# Patient Record
Sex: Female | Born: 1970 | Race: Asian | Hispanic: No | Marital: Married | Smoking: Never smoker
Health system: Southern US, Community
[De-identification: ages and names within clinical notes are randomized; demographics above are authoritative.]

## PROBLEM LIST (undated history)

## (undated) DIAGNOSIS — K3580 Unspecified acute appendicitis: Secondary | ICD-10-CM

## (undated) DIAGNOSIS — N840 Polyp of corpus uteri: Secondary | ICD-10-CM

## (undated) DIAGNOSIS — R079 Chest pain, unspecified: Secondary | ICD-10-CM

## (undated) DIAGNOSIS — D649 Anemia, unspecified: Secondary | ICD-10-CM

## (undated) DIAGNOSIS — E559 Vitamin D deficiency, unspecified: Secondary | ICD-10-CM

## (undated) DIAGNOSIS — E785 Hyperlipidemia, unspecified: Secondary | ICD-10-CM

## (undated) DIAGNOSIS — N920 Excessive and frequent menstruation with regular cycle: Secondary | ICD-10-CM

## (undated) DIAGNOSIS — E119 Type 2 diabetes mellitus without complications: Secondary | ICD-10-CM

## (undated) HISTORY — DX: Vitamin D deficiency, unspecified: E55.9

## (undated) HISTORY — DX: Type 2 diabetes mellitus without complications: E11.9

## (undated) HISTORY — DX: Polyp of corpus uteri: N84.0

## (undated) HISTORY — DX: Excessive and frequent menstruation with regular cycle: N92.0

## (undated) HISTORY — DX: Hyperlipidemia, unspecified: E78.5

## (undated) HISTORY — PX: TUBAL LIGATION: SHX77

## (undated) HISTORY — DX: Unspecified acute appendicitis: K35.80

---

## 2005-07-19 ENCOUNTER — Encounter: Admission: RE | Admit: 2005-07-19 | Discharge: 2005-07-19 | Payer: Self-pay | Admitting: Internal Medicine

## 2006-03-16 ENCOUNTER — Encounter: Admission: RE | Admit: 2006-03-16 | Discharge: 2006-03-16 | Payer: Self-pay | Admitting: Internal Medicine

## 2008-03-18 ENCOUNTER — Emergency Department (HOSPITAL_COMMUNITY): Admission: EM | Admit: 2008-03-18 | Discharge: 2008-03-18 | Payer: Self-pay | Admitting: Emergency Medicine

## 2010-06-20 LAB — HM PAP SMEAR: HM Pap smear: NORMAL

## 2010-12-21 ENCOUNTER — Other Ambulatory Visit: Payer: Self-pay | Admitting: Family Medicine

## 2010-12-21 DIAGNOSIS — R221 Localized swelling, mass and lump, neck: Secondary | ICD-10-CM

## 2010-12-24 ENCOUNTER — Ambulatory Visit
Admission: RE | Admit: 2010-12-24 | Discharge: 2010-12-24 | Disposition: A | Discharge status: Home / Self Care | Payer: BC Managed Care – PPO | Source: Ambulatory Visit | Attending: Family Medicine | Admitting: Family Medicine

## 2010-12-24 DIAGNOSIS — R221 Localized swelling, mass and lump, neck: Secondary | ICD-10-CM

## 2011-03-21 LAB — URINALYSIS, ROUTINE W REFLEX MICROSCOPIC
Bilirubin Urine: NEGATIVE
Nitrite: NEGATIVE
Protein, ur: 100 — AB
pH: 6.5

## 2011-03-21 LAB — URINE MICROSCOPIC-ADD ON

## 2011-03-21 LAB — URINE CULTURE: Colony Count: >=100000

## 2011-03-21 LAB — POCT PREGNANCY, URINE: Preg Test, Ur: NEGATIVE

## 2011-04-18 LAB — HEMOGLOBIN A1C: Hgb A1c MFr Bld: 8.5 % — AB (ref 4.0–6.0)

## 2011-04-18 LAB — LIPID PANEL: Triglycerides: 66 mg/dL (ref 40–160)

## 2011-04-18 LAB — BASIC METABOLIC PANEL: Glucose: 187 mg/dL

## 2011-07-02 DIAGNOSIS — IMO0001 Reserved for inherently not codable concepts without codable children: Secondary | ICD-10-CM | POA: Insufficient documentation

## 2011-07-02 DIAGNOSIS — E119 Type 2 diabetes mellitus without complications: Secondary | ICD-10-CM

## 2011-07-02 DIAGNOSIS — E785 Hyperlipidemia, unspecified: Secondary | ICD-10-CM

## 2011-07-02 DIAGNOSIS — E1169 Type 2 diabetes mellitus with other specified complication: Secondary | ICD-10-CM | POA: Insufficient documentation

## 2011-07-23 ENCOUNTER — Ambulatory Visit (INDEPENDENT_AMBULATORY_CARE_PROVIDER_SITE_OTHER): Payer: BC Managed Care – PPO | Admitting: Physician Assistant

## 2011-07-23 ENCOUNTER — Encounter: Payer: Self-pay | Admitting: Physician Assistant

## 2011-07-23 VITALS — BP 112/71 | HR 88 | Temp 98.8°F | Resp 16 | Ht 63.5 in | Wt 171.0 lb

## 2011-07-23 DIAGNOSIS — L509 Urticaria, unspecified: Secondary | ICD-10-CM

## 2011-07-23 DIAGNOSIS — L299 Pruritus, unspecified: Secondary | ICD-10-CM

## 2011-07-23 NOTE — Progress Notes (Signed)
  Subjective:    Patient ID: Shelley Escobar, female    DOB: [birth year], 41 y.o.   MRN: [id number]  HPI  Patient C/O itching and rash 2-3 hours after eating tuna fish yesterday afternoon.  This is not a new food for her.  Rash has remained the same since onset until ~ 2 hours ago when it improved.  She states that it has almost resolved completely.  She has never had a reaction like this in the past.  She denies any new meds or other substances.  Review of Systems  Constitutional: Negative.   HENT: Negative for trouble swallowing.   Respiratory: Negative for cough and shortness of breath.   Skin: Positive for rash.       Pruitis, hive like lesions       Objective:   Physical Exam  Constitutional: She appears well-developed and well-nourished.  HENT:  Mouth/Throat: Oropharynx is clear and moist.  Cardiovascular: Normal rate, regular rhythm and normal heart sounds.   Pulmonary/Chest: Effort normal and breath sounds normal. No respiratory distress. She has no wheezes.  Skin: Skin is warm. No rash (petichiae noted on abdomen where patient states she scratched yesterday) noted. No erythema.          Assessment & Plan:  Take Claritin once a day for 4 or 5 days Watch for swelling in the mouth and shortness of breath.  Go to the ER if that happens. Return if symptoms persist.

## 2011-07-23 NOTE — Patient Instructions (Signed)
Take Claritin once a day for 4 or 5 days Watch for swelling in the mouth and shortness of breath.  Go to the ER if that happens. Return if symptoms persist.

## 2011-07-25 ENCOUNTER — Ambulatory Visit (INDEPENDENT_AMBULATORY_CARE_PROVIDER_SITE_OTHER): Payer: BC Managed Care – PPO | Admitting: Family Medicine

## 2011-07-25 ENCOUNTER — Encounter: Payer: Self-pay | Admitting: Family Medicine

## 2011-07-25 DIAGNOSIS — I10 Essential (primary) hypertension: Secondary | ICD-10-CM

## 2011-07-25 DIAGNOSIS — E785 Hyperlipidemia, unspecified: Secondary | ICD-10-CM

## 2011-07-25 DIAGNOSIS — E119 Type 2 diabetes mellitus without complications: Secondary | ICD-10-CM

## 2011-07-25 LAB — LIPID PANEL
HDL: 34 mg/dL — ABNORMAL LOW (ref >39)
LDL Cholesterol: 50 mg/dL (ref 0–99)

## 2011-07-25 LAB — COMPREHENSIVE METABOLIC PANEL
AST: 54 U/L — ABNORMAL HIGH (ref 0–37)
BUN: 15 mg/dL (ref 6–23)
Chloride: 100 mEq/L (ref 96–112)
Glucose, Bld: 248 mg/dL — ABNORMAL HIGH (ref 70–99)
Sodium: 134 mEq/L — ABNORMAL LOW (ref 135–145)
Total Protein: 7.5 g/dL (ref 6.0–8.3)

## 2011-07-25 MED ORDER — METFORMIN HCL ER (MOD) 1000 MG PO TB24: 1000.0000 mg | ORAL_TABLET | Freq: Two times a day (BID) | ORAL | Status: DC

## 2011-07-25 MED ORDER — METFORMIN HCL ER 500 MG PO TB24: 1000.0000 mg | ORAL_TABLET | Freq: Two times a day (BID) | ORAL | Status: DC

## 2011-07-25 MED ORDER — METFORMIN HCL ER 500 MG PO TB24: ORAL_TABLET | ORAL | Status: DC

## 2011-07-25 MED ORDER — INSULIN GLARGINE 100 UNIT/ML ~~LOC~~ SOLN: 22.0000 [IU] | Freq: Every day | SUBCUTANEOUS | Status: DC

## 2011-07-25 MED ORDER — GEMFIBROZIL 600 MG PO TABS: 600.0000 mg | ORAL_TABLET | Freq: Two times a day (BID) | ORAL | Status: DC

## 2011-07-25 NOTE — Progress Notes (Signed)
  Subjective:    Patient ID: Shelley Escobar, female    DOB: 01-20-1971, 41 y.o.   MRN: [id number]  HPI Presents for routine diabetes follow up.  Compliant with medications/insulin without s/e  Fasting BS 165 range No visual changes or lower extremity lesions   Review of Systems  Constitutional: Negative for activity change and unexpected weight change.  Respiratory: Negative for shortness of breath.   Cardiovascular: Negative for chest pain and leg swelling.  Neurological:       Denies parathesias to LE        Objective:   Physical Exam  Constitutional: She appears well-developed and well-nourished.  Neck: No thyromegaly present.       No bruits  Cardiovascular: Normal rate, regular rhythm, normal heart sounds and intact distal pulses.   Pulmonary/Chest: Effort normal and breath sounds normal.  Neurological: She is alert.  Skin: Pallor: early neuropathic changes.          Assessment & Plan:   Problem List as of 07/25/2011          Unprioritized   DM (diabetes mellitus), type 2   Last Assessment & Plan Note   07/25/2011 Office Visit Addendum 07/25/2011  9:57 AM by Marcos Eke. Hal Hope, MD    Monitor portions.  Eat 6 small meals a day. Continue with daily exercise.  Continue with Metformin ER 500mg  2 twice daily ASA 81 mg daily Lantus increase to 22 units daily with goal blood sugars <120, patient instructed on how to self titrate Check A1C today D/C Januvia Will add ARB next visit, unable to tolerate ACE secondary to cough Follow up 3 months Follow up 3 months    Dyslipidemia   Last Assessment & Plan Note   07/25/2011 Office Visit Signed 07/25/2011  9:42 AM by Marcos Eke. Hal Hope, MD    Continue Lopid Check Lipid panel today

## 2011-07-25 NOTE — Assessment & Plan Note (Addendum)
Monitor portions.  Eat 6 small meals a day. Continue with daily exercise.  Continue with Metformin ER 500mg  2 twice daily ASA 81 mg daily Lantus increase to 22 units daily with goal blood sugars <120, patient instructed on how to self titrate Check A1C today D/C Januvia Will add ARB next visit, unable to tolerate ACE secondary to cough Follow up 3 months Follow up 3 months

## 2011-07-25 NOTE — Assessment & Plan Note (Signed)
Continue Lopid Check Lipid panel today

## 2011-07-25 NOTE — Patient Instructions (Signed)
DM (diabetes mellitus), type 2   Last Assessment & Plan Note   07/25/2011 Office Visit Addendum 07/25/2011  9:57 AM by Marcos Eke. Hal Hope, MD   Monitor portions.  Eat 6 small meals a day. Continue with daily exercise.  Continue with Metformin ER 500mg  2 twice daily ASA 81 mg daily Lantus increase to 22 units daily with goal blood sugars <120, patient instructed on how to self titrate Check A1C today D/C Januvia Will add ARB next visit, unable to tolerate ACE secondary to cough Follow up 3 months    Dyslipidemia  Last Assessment & Plan Note  07/25/2011 Office Visit Signed 07/25/2011  9:42 AM by Marcos Eke. Hal Hope, MD   Continue Lopid Check Lipid panel today Follow up 3 months

## 2011-07-27 NOTE — Progress Notes (Signed)
Quick Note:  Please let patient know their A1C was elevated. Insulin increased at time of OV. However patient will need to increase their insulin starting next week by 2 units every 4 days until their fasting BS <150. Please encourage their dietary and exercise efforts. Thanks! KR Please speak with son as pt does not speak [place] well. Thanks!! ______

## 2011-08-01 ENCOUNTER — Telehealth: Payer: Self-pay

## 2011-08-01 NOTE — Telephone Encounter (Signed)
.  [facility]    PT HAD REFILL FOR DIABETIC INJ, WAS CHANGED BY PHARMACY?  PT DOES NOT KNOW HOW TO USE THE DIFFERENT ONE,WANTS TO GO BACK TO ORIGINAL RX   BEST PHONE [phone]

## 2011-08-02 NOTE — Telephone Encounter (Signed)
What was patient changed to? We do not have record of change.  Shelley Escobar

## 2011-08-03 NOTE — Telephone Encounter (Signed)
Called [facility]/[address] and spoke with pharmacist to determine what change was made in Lantus. Pharmacist reported that the last script read #10, and they assumed and filled it as 10 ml (vial). Pt has previously used the [employer]. Asked pharmacist to please fill Rx with the solostar pens with same sig and RFs. Pharmacist agreed. Called pt's son and explained what had happened and that pts Rx is being refilled with pens. Asked son if pt was doing OK increasing dose as instructed. Son reports that she is increasing as directed and will call if have any questions.

## 2011-10-15 ENCOUNTER — Other Ambulatory Visit: Payer: Self-pay | Admitting: Internal Medicine

## 2011-10-24 ENCOUNTER — Encounter: Payer: Self-pay | Admitting: Family Medicine

## 2011-10-24 ENCOUNTER — Ambulatory Visit (INDEPENDENT_AMBULATORY_CARE_PROVIDER_SITE_OTHER): Payer: BC Managed Care – PPO | Admitting: Family Medicine

## 2011-10-24 VITALS — BP 116/77 | HR 86 | Temp 98.2°F | Resp 16 | Ht 63.5 in | Wt 174.2 lb

## 2011-10-24 DIAGNOSIS — E109 Type 1 diabetes mellitus without complications: Secondary | ICD-10-CM

## 2011-10-24 DIAGNOSIS — E119 Type 2 diabetes mellitus without complications: Secondary | ICD-10-CM

## 2011-10-24 DIAGNOSIS — IMO0001 Reserved for inherently not codable concepts without codable children: Secondary | ICD-10-CM

## 2011-10-24 DIAGNOSIS — E785 Hyperlipidemia, unspecified: Secondary | ICD-10-CM

## 2011-10-24 LAB — LIPID PANEL
HDL: 32 mg/dL — ABNORMAL LOW (ref >39)
Total CHOL/HDL Ratio: 2.8 Ratio
Triglycerides: 37 mg/dL (ref <150)
VLDL: 7 mg/dL (ref 0–40)

## 2011-10-24 MED ORDER — METFORMIN HCL ER (MOD) 500 MG PO TB24: ORAL_TABLET | ORAL | Status: DC

## 2011-10-24 MED ORDER — INSULIN GLARGINE 100 UNIT/ML ~~LOC~~ SOLN: 30.0000 [IU] | Freq: Every day | SUBCUTANEOUS | Status: DC

## 2011-10-24 MED ORDER — GEMFIBROZIL 600 MG PO TABS: 600.0000 mg | ORAL_TABLET | Freq: Two times a day (BID) | ORAL | Status: DC

## 2011-10-24 NOTE — Progress Notes (Signed)
  Subjective:    Patient ID: Shelley Escobar, female    DOB: 02-Feb-1971, 41 y.o.   MRN: [id number]  HPI  Patient presents in routine follow up of diabetes  Intermittently checks blood sugar; last BS check was 114 which was not fasting per patient No recent eye check; denies change in visual acuity NO LE lesions with feet  Lantus 28 units daily; states insulin making her more hungry  Gets up 1-2 times at time to urinate  Walks most days  Mammogram 4/13 normal Pap 1/12 NL Pneumovax/Tdap UTD  Review of Systems     Objective:   Physical Exam  Constitutional: She appears well-developed.  Neck: Neck supple. No thyromegaly present.  Cardiovascular: Normal rate, regular rhythm and normal heart sounds.   Pulmonary/Chest: Effort normal and breath sounds normal.  Abdominal: Soft. Bowel sounds are normal.  Neurological: She is alert.  Skin:       No LE lesions Early neuropathic changes          Assessment & Plan:   1. IDDM (insulin dependent diabetes mellitus)  POCT glycosylated hemoglobin (Hb A1C)  2. Dyslipidemia  Lipid panel   See medications on AVS  Son, Perrin Maltese is translator [phone]

## 2012-01-23 ENCOUNTER — Encounter: Payer: Self-pay | Admitting: Family Medicine

## 2012-01-25 ENCOUNTER — Ambulatory Visit (INDEPENDENT_AMBULATORY_CARE_PROVIDER_SITE_OTHER): Payer: BC Managed Care – PPO | Admitting: Family Medicine

## 2012-01-25 VITALS — BP 101/68 | HR 86 | Temp 98.0°F | Resp 16 | Ht 64.0 in | Wt 175.0 lb

## 2012-01-25 DIAGNOSIS — E669 Obesity, unspecified: Secondary | ICD-10-CM

## 2012-01-25 DIAGNOSIS — R300 Dysuria: Secondary | ICD-10-CM

## 2012-01-25 DIAGNOSIS — IMO0001 Reserved for inherently not codable concepts without codable children: Secondary | ICD-10-CM

## 2012-01-25 LAB — POCT URINALYSIS DIPSTICK
Leukocytes, UA: NEGATIVE
Nitrite, UA: NEGATIVE
Urobilinogen, UA: 0.2
pH, UA: 5.5

## 2012-01-25 LAB — POCT GLYCOSYLATED HEMOGLOBIN (HGB A1C): Hemoglobin A1C: 7.3

## 2012-01-25 MED ORDER — METFORMIN HCL 500 MG PO TABS: 500.0000 mg | ORAL_TABLET | Freq: Two times a day (BID) | ORAL | Status: DC

## 2012-01-25 NOTE — Patient Instructions (Signed)
Insulin change: Take 34 units every day.  Continue to check blood sugars at least once a day at the same time of day. Metformin: 500 mg tablets   Take 1 tablet twice a day with meals.

## 2012-01-25 NOTE — Progress Notes (Signed)
  Subjective:    Patient ID: Shelley Escobar, female    DOB: July 05, 1970, 41 y.o.   MRN: [id number]  HPI  This 41 y.o. [place] female is here with her son for DM follow-up. Compliant with medication  but Elmon Kirschner is too expensive; wants to change back to Metformin. Nutrition is reasonable- she avoids  sweets and sodas. Takes Insulin every AM; rotates sites in abdominal area and legs.   Last eye exam: > 1 year ago. No vision changes.   (Pt speaks limited [place]).    Review of Systems  Constitutional: Negative for fever, appetite change, fatigue and unexpected weight change.  Eyes: Negative for visual disturbance.  Respiratory: Negative for chest tightness and shortness of breath.   Cardiovascular: Negative for chest pain and palpitations.  Genitourinary: Negative.   Neurological: Negative for dizziness, weakness, light-headedness, numbness and headaches.       Objective:   Physical Exam  Nursing note and vitals reviewed. Constitutional: She is oriented to person, place, and time. She appears well-developed and well-nourished. No distress.  HENT:  Head: Normocephalic and atraumatic.  Right Ear: External ear normal.  Left Ear: External ear normal.  Nose: Nose normal.  Mouth/Throat: Oropharynx is clear and moist.  Eyes: Conjunctivae and EOM are normal. No scleral icterus.  Cardiovascular: Normal rate and regular rhythm.   Pulmonary/Chest: Effort normal. No respiratory distress.  Musculoskeletal: Normal range of motion. She exhibits no edema.  Neurological: [name], place, and time. No cranial nerve deficit.  Skin: Skin is warm and dry.  Psychiatric: She has a normal mood and affect. Her behavior is normal.    Results for orders placed in visit on 01/25/12  POCT GLYCOSYLATED HEMOGLOBIN (HGB A1C)      Component Value Range   Hemoglobin A1C 7.3    POCT URINALYSIS DIPSTICK      Component Value Range   Color, UA yellow     Clarity, UA clear     Glucose, UA neg     Bilirubin, UA neg     Ketones, UA neg     Spec Grav, UA 1.010     Blood, UA neg     pH, UA 5.5     Protein, UA neg     Urobilinogen, UA 0.2     Nitrite, UA neg     Leukocytes, UA Negative          Assessment & Plan:   1. Type II or unspecified type diabetes mellitus without mention of complication, uncontrolled  Continue Lantus - increase dose to 34 units daily and check FSBS 2x/day  Reduce Metformin 500 mg to 1 tab bid pc  2. Dysuria - may be related to ingested foods or perimenopausal changes Monitor

## 2012-01-27 ENCOUNTER — Encounter: Payer: Self-pay | Admitting: Family Medicine

## 2012-01-27 DIAGNOSIS — E6609 Other obesity due to excess calories: Secondary | ICD-10-CM | POA: Insufficient documentation

## 2012-01-27 DIAGNOSIS — E669 Obesity, unspecified: Secondary | ICD-10-CM | POA: Insufficient documentation

## 2012-04-18 ENCOUNTER — Ambulatory Visit (INDEPENDENT_AMBULATORY_CARE_PROVIDER_SITE_OTHER): Payer: BC Managed Care – PPO | Admitting: Family Medicine

## 2012-04-18 ENCOUNTER — Encounter: Payer: Self-pay | Admitting: Family Medicine

## 2012-04-18 VITALS — BP 100/70 | HR 92 | Temp 98.8°F | Resp 16 | Ht 63.0 in | Wt 175.2 lb

## 2012-04-18 DIAGNOSIS — N644 Mastodynia: Secondary | ICD-10-CM

## 2012-04-18 DIAGNOSIS — Z23 Encounter for immunization: Secondary | ICD-10-CM

## 2012-04-18 NOTE — Patient Instructions (Addendum)
Breast Tenderness Breast tenderness is a common complaint made by women of all ages. It is also called mastalgia or mastodynia, which means breast pain. The condition can range from mild discomfort to severe pain. It has a variety of causes. Your caregiver will find out the likely cause of your breast tenderness by examining your breasts, asking you about symptoms and perhaps ordering some tests. Breast tenderness usually does not mean you have breast cancer. CAUSES  Breast tenderness has many possible causes. They include:  Premenstrual changes. A week to 10 days before your period, your breasts might ache or feel tender.  Other hormonal causes. These include:  When sexual and physical traits mature (puberty).  Pregnancy.  The time right before and the year after menopause (perimenopause).  The day when it has been 12 months since your last period (menopause).  Large breasts.  Infection (also called mastitis).  Birth control pills.  Breastfeeding. Tenderness can occur if the breasts are overfull with milk or if a milk duct is blocked.  Injury.  Fibrocystic breast changes. This is not cancer (benign). It causes painful breasts that feel lumpy.  Fluid-filled sacs (cysts). Often cysts can be drained in your healthcare provider's office.  Fibroadenoma. This is a tumor that is not cancerous.  Medication side effects. Blood pressure drugs and diuretics (which increase urine flow) sometimes cause breast tenderness.  Previous breast surgery, such as a breast reduction.  Breast cancer. Cancer is rarely the reason breasts are tender. In most women, tenderness is caused by something else. DIAGNOSIS  Several methods can be used to find out why your breasts are tender. They include:  Visual inspection of the breasts.  Examination by hand.  Tests, such as:  Mammogram.  Ultrasound.  Biopsy.  Lab test of any fluid coming from the nipple.  Blood tests.  MRI. TREATMENT    Treatment is directed to the cause of the breast tenderness from doing nothing for minor discomfort, wearing a good support bra but also may include:  Taking over-the-counter medicines for pain or discomfort as directed by your caregiver.  Prescription medicine for breast tenderness related to:  Premenstrual.  Fibrocystic.  Puberty.  Pregnancy.  Menopause.  Previous breast surgery.  Large breasts.  Antibiotics for infection.  Birth control pills for fibrocystic and premenstrual changes.  More frequent feedings or pumping of the breasts and warm compresses for breast engorgement when nursing.  Cold and warm compresses and a good support bra for most breast injuries.  Breast cysts are sometimes drained with a needle (aspiration) or removed with minor surgery.  Fibroadenomas are usually removed with minor surgery.  Changing or stopping the medicine when it is responsible for causing the breast tenderness.  When breast cancer is present with or without causing pain, it is usually treated with major surgery (with or without radiation) and chemotherapy. HOME CARE INSTRUCTIONS  Breast tenderness often can be handled at home. You can try:  Getting fitted for a new bra that provides more support, especially during exercise.  Wearing a more supportive or sports bra while sleeping when your breasts are very tender.  If you have a breast injury, using an ice pack for 15 to 20 minutes. Wrap the pack in a towel. Do not put the ice pack directly on your breast.  If your breasts are too full of milk as a result of breastfeeding, try:  Expressing milk either by hand or with a breast pump.  Applying a warm compress for relief.    Taking over-the-counter pain relievers, if this is OK with your caregiver.  Taking medicine that your caregiver prescribes. These might include antibiotics or birth control pills. Over the long term, your breast tenderness might be eased if you:  Cut  down on caffeine.  Reduce the amount of fat in your diet. Also, learn how to do breast examinations at home. This will help you tell when you have an unusual growth or lump that could cause tenderness. And keep a log of the days and times when your breasts are most tender. This will help you and your caregiver find the right solution. SEEK MEDICAL CARE IF:   Any part of your breast is hard, red and hot to the touch. This could be a sign of infection.  Fluid is coming out of your nipples (and you are not breastfeeding). Especially watch for blood or pus.  You have a fever as well as breast tenderness.  You have a new or painful lump in your breast that remains after your period ends.  You have tried to take care of the pain at home, but it has not gone away.  Your breast pain is getting worse. Or, the pain is making it hard to do the things you usually do during your day. Document Released: 05/19/2008 Document Revised: 08/29/2011 Document Reviewed: 05/19/2008 Exit[employer] Patient Information 2013 [employer].  

## 2012-04-18 NOTE — Progress Notes (Signed)
S:  This  41 y.o. [place] female c/o right breast tenderness for several months. She had a normal mammogram in January 2013. 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 OTC Ibuprofen relieves the discomfort. She drinks some Green Tea and water only. She denies nipple discharge or change in appearance of breast. Left breast is normal.  ROS: Noncontributory.  O:  Filed Vitals:   04/18/12 1155  BP: 100/70  Pulse: 92  Temp: 98.8 F (37.1 C)  Resp: 16   GEN: In NAD; WN,WD. Breasts: Normal appearance without nipple retraction, skin dimpling,discharge, masses or tenderness. COR: RRR. LUNGS: Normal resp rate and effort. NEURO: A&O x 3; CNs intact. Nonfocal.  A/P: 1. Breast tenderness - no abnormality found Pt reassured. Reduce caffeine intake (Green Tea)  2. Need for prophylactic vaccination and inoculation against influenza  Flu vaccine greater than or equal to 3yo with preservative IM

## 2012-05-01 ENCOUNTER — Encounter: Payer: Self-pay | Admitting: Family Medicine

## 2012-05-01 ENCOUNTER — Ambulatory Visit (INDEPENDENT_AMBULATORY_CARE_PROVIDER_SITE_OTHER): Payer: BC Managed Care – PPO | Admitting: Family Medicine

## 2012-05-01 VITALS — BP 100/78 | HR 83 | Temp 97.9°F | Resp 16 | Ht 63.5 in | Wt 176.4 lb

## 2012-05-01 DIAGNOSIS — R5381 Other malaise: Secondary | ICD-10-CM

## 2012-05-01 DIAGNOSIS — IMO0001 Reserved for inherently not codable concepts without codable children: Secondary | ICD-10-CM

## 2012-05-01 DIAGNOSIS — E785 Hyperlipidemia, unspecified: Secondary | ICD-10-CM

## 2012-05-01 DIAGNOSIS — R5383 Other fatigue: Secondary | ICD-10-CM

## 2012-05-01 LAB — POCT GLYCOSYLATED HEMOGLOBIN (HGB A1C): Hemoglobin A1C: 8.8

## 2012-05-01 MED ORDER — INSULIN GLARGINE 100 UNIT/ML ~~LOC~~ SOLN: SUBCUTANEOUS | Status: DC

## 2012-05-01 MED ORDER — METFORMIN HCL 500 MG PO TABS: 500.0000 mg | ORAL_TABLET | Freq: Two times a day (BID) | ORAL | Status: DC

## 2012-05-01 NOTE — Patient Instructions (Signed)
Your Insulin dose has been increased to 40 units because your A1c= 8.8%. Improve your nutrition and try to get some regular exercise; find some fun activities to dothat will get you more active.

## 2012-05-01 NOTE — Progress Notes (Signed)
S: This 41 y.o. Diabetic is compliant w/ medications; however, she usually does FSBS after meals (not daily). Post-prand FSBS ~ 170. Pt has no fasting values to report. She denies symptoms c/w hypoglycemia. She skips at least one meal most days of the week. 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 Her son (who is the interpreter today) reports pt never takes a vacation.   ROS: Negative for unexplained weight or appetite change, diaphoresis, fever/ chills, CP or tightness, palpitations, SOB or cough, GI problems, weakness, numbness or syncope.   O:  Filed Vitals:   05/01/12 1309  BP: 100/78  Pulse: 83  Temp: 97.9 F (36.6 C)  Resp: 16   GEN: In NAD; WN,WD. HEENT: [place]/AT; EOMI w/ clear conj/scl. EACs normal. Oroph unremarkable. NECK: Supple w/o LAN or TMG; soft tissue pad above L clavicle. COR: RRR. LUNGS: Normal resp rate and effort. EXT: No edema. Pulses 2+/=. SKIN: Feet- dry cracked skin of bottoms. No ulcerations. NEURO: A&O x 3; CNs intact. DTRs 2+/=. Otherwise nonfocal.   Results for orders placed in visit on 05/01/12  POCT GLYCOSYLATED HEMOGLOBIN (HGB A1C)      Component Value Range   Hemoglobin A1C 8.8       A1c = 7.3% in August   A/P:  1. Fatigue  Vitamin D, 25-hydroxy, TSH, T4, Free Advised pt to work less and take a vacation for healthy work-life balance  2. Type II or unspecified type diabetes mellitus without mention of complication, uncontrolled  Increase Lantus to 40 units hs Continue Metformin 500 mg 1 tab bid with meals. Comprehensive metabolic panel Check FSBS every AM and occasionally after meals. Do not skip meals.  3. Hyperlipidemia - low HDL Continue Gemfibrozil

## 2012-05-02 LAB — COMPREHENSIVE METABOLIC PANEL
BUN: 11 mg/dL (ref 6–23)
CO2: 26 mEq/L (ref 19–32)
Chloride: 102 mEq/L (ref 96–112)
Glucose, Bld: 111 mg/dL — ABNORMAL HIGH (ref 70–99)
Sodium: 139 mEq/L (ref 135–145)

## 2012-05-02 LAB — VITAMIN D 25 HYDROXY (VIT D DEFICIENCY, FRACTURES): Vit D, 25-Hydroxy: 24 ng/mL — ABNORMAL LOW (ref 30–89)

## 2012-05-03 ENCOUNTER — Telehealth: Payer: Self-pay

## 2012-05-03 NOTE — Progress Notes (Signed)
Quick Note:  Please call pt and advise that the following labs are abnormal... Vitamin D level is too low; get over-the-counter Vitamin D3 2000 IU and take 1 capsule every day. Try to get some sun exposure most days of the week and eat foods that are rich in Vit D (salmion, tuna, eggs, mushrooms and low-fat dairy products like milk, cottage cheese and yogurt).  All other labs look good.  Copy to pt. ______

## 2012-05-03 NOTE — Telephone Encounter (Signed)
See labs 

## 2012-05-03 NOTE — Telephone Encounter (Signed)
Patient would like results from labs.  Best [phone]

## 2012-07-12 ENCOUNTER — Telehealth: Payer: Self-pay

## 2012-07-12 ENCOUNTER — Other Ambulatory Visit: Payer: Self-pay | Admitting: Family Medicine

## 2012-07-12 MED ORDER — METFORMIN HCL 1000 MG PO TABS: 1000.0000 mg | ORAL_TABLET | Freq: Two times a day (BID) | ORAL | Status: DC

## 2012-07-12 NOTE — Telephone Encounter (Signed)
PT OF DR MCPHERSON; CURRENTLY TAKING DIABETES MEDICINE THAT COSTS $120 EVERY TWO WEEKS; NOW PAYING $300 EVERY TWO WEEKS; WOULD LIKE TO KNOW IF THERE IS A DIFFERENT MEDICATION THAT IS CHEAPER THAT SHE CAN TAKE   CALL SON TO RELY THE INFORMATION   [phone]

## 2012-07-12 NOTE — Telephone Encounter (Signed)
I spoke w/ pt's son; Lantus is too costly and pt has last dose tonight. Will discontinue Lantus and try increasing Metformin to 1000 mg twice a day with healthier meals (pt to reduce rice, noodles and potatoes serving sizes). Advised to increase vegetable intake. Pt has f/u appt Feb 2014.

## 2012-07-17 ENCOUNTER — Ambulatory Visit (INDEPENDENT_AMBULATORY_CARE_PROVIDER_SITE_OTHER): Payer: BC Managed Care – PPO | Admitting: Family Medicine

## 2012-07-17 ENCOUNTER — Telehealth: Payer: Self-pay

## 2012-07-17 VITALS — BP 119/78 | HR 100 | Temp 98.2°F | Resp 16 | Ht 64.0 in | Wt 173.0 lb

## 2012-07-17 DIAGNOSIS — R05 Cough: Secondary | ICD-10-CM

## 2012-07-17 DIAGNOSIS — J111 Influenza due to unidentified influenza virus with other respiratory manifestations: Secondary | ICD-10-CM

## 2012-07-17 DIAGNOSIS — R6889 Other general symptoms and signs: Secondary | ICD-10-CM

## 2012-07-17 DIAGNOSIS — R059 Cough, unspecified: Secondary | ICD-10-CM

## 2012-07-17 DIAGNOSIS — J101 Influenza due to other identified influenza virus with other respiratory manifestations: Secondary | ICD-10-CM

## 2012-07-17 LAB — POCT INFLUENZA A/B
Influenza A, POC: POSITIVE
Influenza B, POC: NEGATIVE

## 2012-07-17 MED ORDER — BENZONATATE 100 MG PO CAPS: 100.0000 mg | ORAL_CAPSULE | Freq: Three times a day (TID) | ORAL | Status: DC | PRN

## 2012-07-17 MED ORDER — OSELTAMIVIR PHOSPHATE 75 MG PO CAPS: 75.0000 mg | ORAL_CAPSULE | Freq: Two times a day (BID) | ORAL | Status: DC

## 2012-07-17 MED ORDER — HYDROCODONE-HOMATROPINE 5-1.5 MG/5ML PO SYRP: 5.0000 mL | ORAL_SOLUTION | Freq: Every evening | ORAL | Status: DC | PRN

## 2012-07-17 NOTE — Progress Notes (Signed)
[facility]:  Office Visit  Chief Complaint:  Chief Complaint  Patient presents with  . Cough    x 3 days  . Generalized Body Aches  . Headache    HPI: Shelley Escobar is a 42 y.o. female who complains of  Flu like sxs-cough x 3 days, generalized aches, chills starting last night. Daughter in law is also sick. Has chills, cough, white yellow productive, no ear pain. Shelley Escobar.  Has tried otc meds without relief. Well controlled Diabetic, non smoker. Denies sore throat.  Has had flu vaccine.   Past Medical History  Diagnosis Date  . Hyperlipidemia   . DM (diabetes mellitus)    Past Surgical History  Procedure Date  . Tubal ligation    History   Social History  . Marital Status: Married    Spouse Name: N/A    Number of Children: N/A  . Years of Education: N/A   Social History Main Topics  . Smoking status: Never Smoker   . Smokeless tobacco: None  . Alcohol Use: No  . Drug Use: None  . Sexually Active: Yes -- Female partner(s)   Other Topics Concern  . None   Social History Narrative  . None   No family history on file. No Known Allergies Prior to Admission medications   Medication Sig Start Date End Date Taking? Authorizing Provider  gemfibrozil (LOPID) 600 MG tablet Take 1 tablet (600 mg total) by mouth 2 (two) times daily. 10/24/11  Yes Dois Davenport, MD  metFORMIN (GLUCOPHAGE) 1000 MG tablet Take 1 tablet (1,000 mg total) by mouth 2 (two) times daily with a meal. 07/12/12  Yes Maurice March, MD  B-D ULTRAFINE III SHORT PEN 31G X 8 MM MISC USE AS DIRECTED 10/15/11   Sondra Barges, PA-C     ROS: The patient denies night sweats, unintentional weight loss, chest pain, palpitations, wheezing, dyspnea on exertion, vomiting, abdominal pain, dysuria, hematuria, melena, numbness, , or tingling.   All other systems have been reviewed and were otherwise negative with the exception of those mentioned in the HPI and as above.    PHYSICAL  EXAM: Filed Vitals:   07/17/12 1619  BP: 119/78  Pulse: 100  Temp: 98.2 F (36.8 C)  Resp: 16   Filed Vitals:   07/17/12 1619  Height: 5\' 4"  (1.626 m)  Weight: 173 lb (78.472 kg)   Body mass index is 29.70 kg/(m^2).  General: Alert, no acute distress HEENT:  Normocephalic, atraumatic, oropharynx patent. TM nl, No exudates, no sinus tenderness Cardiovascular:  Regular rate and rhythm, no rubs murmurs or gallops.  No Carotid bruits, radial pulse intact. No pedal edema.  Respiratory: Clear to auscultation bilaterally.  No wheezes, rales, or rhonchi.  No cyanosis, no use of accessory musculature GI: No organomegaly, abdomen is soft and non-tender, positive bowel sounds.  No masses. Skin: No rashes. Neurologic: Facial musculature symmetric. Psychiatric: Patient is appropriate throughout our interaction. Lymphatic: No cervical lymphadenopathy Musculoskeletal: Gait intact.   LABS: Results for orders placed in visit on 07/17/12  POCT INFLUENZA A/B      Component Value Range   Influenza A, POC Positive     Influenza B, POC Negative       EKG/XRAY:   Primary read interpreted by Dr. Conley Rolls at [facility].   ASSESSMENT/PLAN: Encounter Diagnoses  Name Primary?  . Flu-like symptoms Yes  . Cough   . Influenza A    Tamiflu 75 mg BID x  5 days Tessalon Perles for cough Hydromet for cough at night F/u prn     Shelley Randazzo PHUONG, DO 07/17/2012 7:19 PM

## 2012-07-17 NOTE — Telephone Encounter (Signed)
Mucinex(plain) and Tylenol for aching if needed. Advised if not better should return to clinic, son agrees. FYI

## 2012-07-17 NOTE — Telephone Encounter (Signed)
Patient's son is calling for his mother, she has a cold and cough and wondering what medication to take over the counter. Due to her being a diabetic.

## 2012-08-01 ENCOUNTER — Encounter: Payer: Self-pay | Admitting: Family Medicine

## 2012-08-01 ENCOUNTER — Ambulatory Visit (INDEPENDENT_AMBULATORY_CARE_PROVIDER_SITE_OTHER): Payer: BC Managed Care – PPO | Admitting: Family Medicine

## 2012-08-01 VITALS — BP 105/71 | HR 99 | Temp 97.7°F | Resp 16 | Ht 64.0 in | Wt 172.0 lb

## 2012-08-01 DIAGNOSIS — IMO0001 Reserved for inherently not codable concepts without codable children: Secondary | ICD-10-CM

## 2012-08-01 DIAGNOSIS — R300 Dysuria: Secondary | ICD-10-CM

## 2012-08-01 LAB — POCT URINALYSIS DIPSTICK
Bilirubin, UA: NEGATIVE
Blood, UA: NEGATIVE
Ketones, UA: NEGATIVE
Leukocytes, UA: NEGATIVE
Protein, UA: NEGATIVE
Spec Grav, UA: 1.02
pH, UA: 5.5

## 2012-08-01 MED ORDER — FLUCONAZOLE 150 MG PO TABS: 150.0000 mg | ORAL_TABLET | Freq: Once | ORAL | Status: DC

## 2012-08-01 MED ORDER — SITAGLIPTIN PHOS-METFORMIN HCL 50-1000 MG PO TABS: 1.0000 | ORAL_TABLET | Freq: Two times a day (BID) | ORAL | Status: DC

## 2012-08-01 NOTE — Patient Instructions (Signed)
I have changed your Diabetes medication to generic Janumet; this tablet has two medications in one tablet (one of the medications is Metformin 1000 mg). The other medication is Januvia; it will help the Metformin to control your blood sugar better. Take this pill twice a day with meals.  Continue to watch what you eat , drink plenty of water and stay active. We will recheck your A1c in 3 months at your next visit. We will also do some blood work so you need to be fasting.

## 2012-08-03 ENCOUNTER — Telehealth: Payer: Self-pay

## 2012-08-03 NOTE — Telephone Encounter (Signed)
Patient son states rx prescribed costs a lot and patient was wondering if they could get something cheaper. Please call back at [phone]. Son name is Stanton Kidney.

## 2012-08-04 NOTE — Progress Notes (Signed)
S:  This 42 y.o. Asian female is here for Diabetes follow-up; her son is her interpreter. She reports compliance w/ medications and no adverse effects. FSBS= mid -100s according to son. Pt denies hypoglycemic episodes. She eats regular meals and maintains adequate water intake.   Her main complaint to day is dysuria. She denies urgency or frequency, hematuria or vaginal discharge. The "burning" is mostly on the "outside". Pt denies flank pain, fever/ chills, pelvic pain or change in bowel habits.  ROS: As per HPI.  O:  Filed Vitals:   08/01/12 0858  BP: 105/71  Pulse: 99  Temp: 97.7 F (36.5 C)  Resp: 16   GEN: In NAD; WN, WD. HENT: [place]/AT; EOMI w/ clear conj/sclerae. EACs normal. Oroph clear and moist. COR: RRR. LINGS: Normal resp rate and effort. BACK: No CVAT. SKIN: W&D; no erythema, rashes or pallor. NEURO: A&O x 3; CNs intact. Nonfocal.   Results for orders placed in visit on 08/01/12  POCT URINALYSIS DIPSTICK      Result Value Range   Color, UA yellow     Clarity, UA clear     Glucose, UA >=1000mg /dl     Bilirubin, UA neg     Ketones, UA neg     Spec Grav, UA 1.020     Blood, UA neg     pH, UA 5.5     Protein, UA neg     Urobilinogen, UA 0.2     Nitrite, UA neg     Leukocytes, UA Negative    POCT GLYCOSYLATED HEMOGLOBIN (HGB A1C)      Result Value Range   Hemoglobin A1C 9.7      A/P:  Dysuria - UA unremarkable; suspect Candida vaginitis; RX: Diflucan 150 mg 1 tab x 1 dose.  Type II or unspecified type diabetes mellitus without mention of complication, uncontrolled - Plan: Discontinue Metformin  RX: Janumet (generic)  50- 1000 mg  1 tab bid with meals; continue FSBS 1-2 x daily and maintain good nutrition.  Meds ordered this encounter  Medications  . sitaGLIPtan-metformin (JANUMET) 50-1000 MG per tablet    Sig: Take 1 tablet by mouth 2 (two) times daily with a meal.    Dispense:  60 tablet    Refill:  5  . fluconazole (DIFLUCAN) 150 MG tablet    Sig: Take  1 tablet (150 mg total) by mouth once.    Dispense:  1 tablet    Refill:  0

## 2012-08-05 NOTE — Telephone Encounter (Signed)
Patients son returned call and Januvia is 290 for a month supply. Was told by Dr. Audria Nine to let us know if to expensive and we could change to something else. Please advise

## 2012-08-05 NOTE — Telephone Encounter (Signed)
Dr. Audria Nine, please advise on a different agent for this pt.  Pt's son states that the new medication is too expensive.  Thank you!

## 2012-08-05 NOTE — Telephone Encounter (Signed)
Left message to return call 

## 2012-08-07 ENCOUNTER — Other Ambulatory Visit: Payer: Self-pay | Admitting: Family Medicine

## 2012-08-07 MED ORDER — METFORMIN HCL 1000 MG PO TABS: 1000.0000 mg | ORAL_TABLET | Freq: Two times a day (BID) | ORAL | Status: DC

## 2012-08-07 MED ORDER — GLIMEPIRIDE 2 MG PO TABS: ORAL_TABLET | ORAL | Status: DC

## 2012-08-07 NOTE — Telephone Encounter (Signed)
I have discontinued previously prescribed medication (combination Metformin w/ Janumet). Pt should resume Metformin 1000 mg 1 tablet twice a day w/ meals. New medication is Glimepiride 2 mg tablet- pt is to take 1/2 tablet twice a day right before meals. Both of these medications are very affordable.

## 2012-08-07 NOTE — Telephone Encounter (Signed)
Patients son advised

## 2012-10-02 ENCOUNTER — Other Ambulatory Visit: Payer: Self-pay | Admitting: Family Medicine

## 2012-10-03 ENCOUNTER — Other Ambulatory Visit: Payer: Self-pay | Admitting: Family Medicine

## 2012-10-31 ENCOUNTER — Encounter: Payer: Self-pay | Admitting: Family Medicine

## 2012-10-31 ENCOUNTER — Ambulatory Visit (INDEPENDENT_AMBULATORY_CARE_PROVIDER_SITE_OTHER): Payer: BC Managed Care – PPO | Admitting: Family Medicine

## 2012-10-31 VITALS — BP 110/72 | HR 86 | Temp 98.2°F | Resp 16 | Ht 63.5 in | Wt 173.2 lb

## 2012-10-31 DIAGNOSIS — IMO0001 Reserved for inherently not codable concepts without codable children: Secondary | ICD-10-CM

## 2012-10-31 DIAGNOSIS — N644 Mastodynia: Secondary | ICD-10-CM

## 2012-10-31 DIAGNOSIS — E785 Hyperlipidemia, unspecified: Secondary | ICD-10-CM

## 2012-10-31 LAB — POCT GLYCOSYLATED HEMOGLOBIN (HGB A1C): Hemoglobin A1C: 8.2

## 2012-10-31 LAB — COMPREHENSIVE METABOLIC PANEL
Albumin: 4.4 g/dL (ref 3.5–5.2)
BUN: 10 mg/dL (ref 6–23)
CO2: 26 mEq/L (ref 19–32)
Calcium: 10 mg/dL (ref 8.4–10.5)
Chloride: 102 mEq/L (ref 96–112)
Glucose, Bld: 139 mg/dL — ABNORMAL HIGH (ref 70–99)
Potassium: 4.4 mEq/L (ref 3.5–5.3)
Sodium: 136 mEq/L (ref 135–145)
Total Protein: 8.2 g/dL (ref 6.0–8.3)

## 2012-10-31 LAB — LIPID PANEL: Cholesterol: 88 mg/dL (ref 0–200)

## 2012-10-31 MED ORDER — METFORMIN HCL 1000 MG PO TABS: 1000.0000 mg | ORAL_TABLET | Freq: Two times a day (BID) | ORAL | Status: DC

## 2012-10-31 MED ORDER — GLIMEPIRIDE 2 MG PO TABS: ORAL_TABLET | ORAL | Status: DC

## 2012-10-31 MED ORDER — GEMFIBROZIL 600 MG PO TABS: 600.0000 mg | ORAL_TABLET | Freq: Two times a day (BID) | ORAL | Status: DC

## 2012-10-31 NOTE — Progress Notes (Signed)
S:  This 42 y.o. Asian female has Type II DM and dyslipidemia. She checks sugar almost daily but usually after eating; FSBS= 150-180. Not checking fasting blood sugar. Pt is complaint w/ medications w/o adverse effects. She has not had hypoglycemia. She reports daily exercising (walking). Sleep hygiene is good.  Pt reports a tender area in lower part of R breast. She tried to schedule her annual MMG but was advised that she needed to be checked by physician first. She has no nipple discharge or other abnormality of breast. MMG in Jan 2013 was normal.  Patient Active Problem List   Diagnosis Date Noted  . Obesity (BMI 30.0-34.9) 01/27/2012  . DM (diabetes mellitus), type 2 07/02/2011  . Dyslipidemia 07/02/2011    PMHx, Soc Hx and Fam Hx reviewed.   ROS: As per HPI. Negative for fever, diaphoresis, vision changes, CP or tightness, palpitations, SOB or DOE, cough, abd pain, n/v/d, myalgias/arthralgias, rashes or erythema, [place], dizziness, weakness, numbness or syncope.   O:  Filed Vitals:   10/31/12 1003  BP: 110/72  Pulse: 86  Temp: 98.2 F (36.8 C)  Resp: 16   GEN: In NAD; WN,WD. HENT: [place]/AT; EOMI w/ clear conj/ sclerae. EACs/ nose/ oroph clear. COR: RRR. LUNGS: Normal resp rate and effort. SKIN: W&D; no rashes or erythema. See DM foot exam. BREASTS: R breast- tender in lower outer quadrant adjacent to chest wall/ ribs. Erythema noted on outer aspects of both breasts. No asymmmetry, inverted nipples, nipple discharge. rashes, palpable masses or dimpling of skin. MS: MAES; no deformities. No c/c/e. NEURO: A&O x 3; CNs intact. Nonfocal.   Results for orders placed in visit on 10/31/12  POCT GLYCOSYLATED HEMOGLOBIN (HGB A1C)      Result Value Range   Hemoglobin A1C 8.2       A/P: Type II or unspecified type diabetes mellitus without mention of complication, uncontrolled - A1c improved from 9.7 %     Plan: Comprehensive metabolic panel, Microalbumin, urine  Breast tenderness in  female - no palpable abnormalities.   Plan: MM Digital Screening  Hyperlipidemia - Plan: Lipid panel, Comprehensive metabolic panel, gemfibrozil (LOPID) 600 MG tablet   Meds ordered this encounter  Medications  . gemfibrozil (LOPID) 600 MG tablet    Sig: Take 1 tablet (600 mg total) by mouth 2 (two) times daily.    Dispense:  60 tablet    Refill:  5  . glimepiride (AMARYL) 2 MG tablet    Sig: TAKE ONE-HALF TABLET BY MOUTH TWICE DAILY BEFORE MEALS    Dispense:  30 tablet    Refill:  5  . metFORMIN (GLUCOPHAGE) 1000 MG tablet    Sig: Take 1 tablet (1,000 mg total) by mouth 2 (two) times daily with a meal.    Dispense:  180 tablet    Refill:  1

## 2012-10-31 NOTE — Patient Instructions (Addendum)
Diabetic control is getting better. Continue taking your medications and eating as healthy as you can. Stay active!   Check you blood sugar every morning before you eat; this is your fasting blood sugar. I have ordered your mammogram. Someone will call you with that appointment. See you in 4 months for a complete physical exam.

## 2012-11-01 LAB — MICROALBUMIN, URINE: Microalb, Ur: 0.5 mg/dL (ref 0.00–1.89)

## 2012-11-04 NOTE — Progress Notes (Signed)
Quick Note:  Please contact pt and advise that the following labs are abnormal... Blood sugar is a little above normal. Sodium, potassium, calcium and kidney tests are normal. Some of the liver tests results are above normal and are slightly increase compared to 6 months ago. Pt had an ultrasound of the abdomen in 2007; it showed fatty material in the liver. This is commonly seen in persons who have Diabetes. Try to eat as healthy as possible and keep blood sugar in normal range. Labs will be checked again at next visit.  Lipid panel is good except HDL ("good") cholesterol is too low. I would like for pt to get Fish Oil capsules 1000-1200 mg and take 1 capsule daily. Do not take it with other pills or medications. Continue taking current medications.  Copy to pt. ______

## 2012-11-14 ENCOUNTER — Encounter: Payer: Self-pay | Admitting: Family Medicine

## 2012-12-21 ENCOUNTER — Ambulatory Visit (INDEPENDENT_AMBULATORY_CARE_PROVIDER_SITE_OTHER): Payer: BC Managed Care – PPO | Admitting: Family Medicine

## 2012-12-21 VITALS — BP 115/79 | HR 94 | Temp 98.1°F | Resp 16 | Ht 63.5 in | Wt 174.0 lb

## 2012-12-21 DIAGNOSIS — N920 Excessive and frequent menstruation with regular cycle: Secondary | ICD-10-CM

## 2012-12-21 LAB — TSH: TSH: 0.816 u[IU]/mL (ref 0.350–4.500)

## 2012-12-21 LAB — POCT CBC
HCT, POC: 33.3 % — AB (ref 37.7–47.9)
Hemoglobin: 10.3 g/dL — AB (ref 12.2–16.2)
Lymph, poc: 2.7 (ref 0.6–3.4)
MCH, POC: 24.4 pg — AB (ref 27–31.2)
MCHC: 30.9 g/dL — AB (ref 31.8–35.4)
MPV: 10.5 fL (ref 0–99.8)
POC MID %: 6.5 %M (ref 0–12)
RBC: 4.22 M/uL (ref 4.04–5.48)
WBC: 8 10*3/uL (ref 4.6–10.2)

## 2012-12-21 LAB — POCT URINE PREGNANCY: Preg Test, Ur: NEGATIVE

## 2012-12-21 MED ORDER — MEDROXYPROGESTERONE ACETATE 10 MG PO TABS
10.0000 mg | ORAL_TABLET | Freq: Every day | ORAL | Status: DC
Start: 2012-12-21 — End: 2013-01-10

## 2012-12-21 NOTE — Progress Notes (Addendum)
[facility] [address] [place] [address] [phone]  Date:  12/21/2012   Name:  Shelley Escobar   DOB:  04/12/1971   MRN:  [id number]  PCP:  Dois Davenport., MD    Chief Complaint: Menorrhagia   History of Present Illness:  Shelley Escobar is a 42 y.o. very pleasant female patient who presents with the following:  Usually her menses last about 4 days. However, right now she has been bleeding for 3 weeks, and heavily for 2 weeks.   She has never had this in the past.  Otherwise she is well and does not have any complaint.  She has NIDDM  She is not having any pain.  She does feel tired.  No syncope, pre- syncope or dizziness  She is changing her sanitary protection twice a day and is using a night time pad.  She has some blood clots.   No other unusual bleeding such as bleeding gums or epistaxis.    Last month her menses were lighter than usual.  She does not smoke.  No history of DVT/ PE/ CVA.  She does not get migraine with aura.   Patient Active Problem List   Diagnosis Date Noted  . Obesity (BMI 30.0-34.9) 01/27/2012  . DM (diabetes mellitus), type 2 07/02/2011  . Dyslipidemia 07/02/2011    Past Medical History  Diagnosis Date  . Hyperlipidemia   . DM (diabetes mellitus)     Past Surgical History  Procedure Laterality Date  . Tubal ligation      History  Substance Use Topics  . Smoking status: Never Smoker   . Smokeless tobacco: Not on file  . Alcohol Use: No    No family history on file.  No Known Allergies  Medication list has been reviewed and updated.  Current Outpatient Prescriptions on File Prior to Visit  Medication Sig Dispense Refill  . gemfibrozil (LOPID) 600 MG tablet Take 1 tablet (600 mg total) by mouth 2 (two) times daily.  60 tablet  5  . glimepiride (AMARYL) 2 MG tablet TAKE ONE-HALF TABLET BY MOUTH TWICE DAILY BEFORE MEALS  30 tablet  5  . metFORMIN (GLUCOPHAGE) 1000 MG tablet Take 1 tablet (1,000 mg total) by mouth  2 (two) times daily with a meal.  180 tablet  1   No current facility-administered medications on file prior to visit.    Review of Systems:  As per HPI- otherwise negative.   Physical Examination: Filed Vitals:   12/21/12 1316  BP: 115/79  Pulse: 94  Temp: 98.1 F (36.7 C)  Resp: 16   Filed Vitals:   12/21/12 1316  Height: 5' 3.5" (1.613 m)  Weight: 174 lb (78.926 kg)   Body mass index is 30.34 kg/(m^2). Ideal Body Weight: Weight in (lb) to have BMI = 25: 143.1  GEN: WDWN, NAD, Non-toxic, A & O x 3, overweight HEENT: Atraumatic, Normocephalic. Neck supple. No masses, No LAD. Ears and Nose: No external deformity. CV: RRR, No M/G/R. No JVD. No thrill. No extra heart sounds. PULM: CTA B, no wheezes, crackles, rhonchi. No retractions. No resp. distress. No accessory muscle use. ABD: S, NT, ND, +BS. No rebound. No HSM. EXTR: No c/c/e NEURO Normal gait.  PSYCH: Normally interactive. Conversant. Not depressed or anxious appearing.  Calm demeanor.  GU: pelvic exam shows dark, clotted blood from cervix, small amount.  No masses, no lesions, no adnexal tenderness, no CMT  Results for orders placed in visit  on 12/21/12  POCT CBC      Result Value Range   WBC 8.0  4.6 - 10.2 K/uL   Lymph, poc 2.7  0.6 - 3.4   POC LYMPH PERCENT 33.5  10 - 50 %L   MID (cbc) 0.5  0 - 0.9   POC MID % 6.5  0 - 12 %M   POC Granulocyte 4.8  2 - 6.9   Granulocyte percent 60.0  37 - 80 %G   RBC 4.22  4.04 - 5.48 M/uL   Hemoglobin 10.3 (*) 12.2 - 16.2 g/dL   HCT, POC 16.1 (*) 09.6 - 47.9 %   MCV 78.8 (*) 80 - 97 fL   MCH, POC 24.4 (*) 27 - 31.2 pg   MCHC 30.9 (*) 31.8 - 35.4 g/dL   RDW, POC 04.5     Platelet Count, POC 234  142 - 424 K/uL   MPV 10.5  0 - 99.8 fL  GLUCOSE, POCT (MANUAL RESULT ENTRY)      Result Value Range   POC Glucose 190 (*) 70 - 99 mg/dl  POCT URINE PREGNANCY      Result Value Range   Preg Test, Ur Negative     Chart review shows Hg of 12 in 2012  Assessment and  Plan: Menorrhagia - Plan: POCT CBC, POCT glucose (manual entry), TSH, POCT urine pregnancy, medroxyPROGESTERone (PROVERA) 10 MG tablet, Ambulatory referral to Obstetrics / Gynecology, CANCELED: hCG, quantitative, pregnancy  Meds ordered this encounter  Medications  . medroxyPROGESTERone (PROVERA) 10 MG tablet    Sig: Take 1 tablet (10 mg total) by mouth daily.    Dispense:  10 tablet    Refill:  0    Menorrhagia.  Treat with provera daily for 10 days. Refer to OBG as she likely needs an endometrial bx.  Discussed through her Son who interprets for her.   Follow- up with TSH result   Signed Abbe Amsterdam, MD   Results for orders placed in visit on 12/21/12  TSH      Result Value Range   TSH 0.816  0.350 - 4.500 uIU/mL  POCT CBC      Result Value Range   WBC 8.0  4.6 - 10.2 K/uL   Lymph, poc 2.7  0.6 - 3.4   POC LYMPH PERCENT 33.5  10 - 50 %L   MID (cbc) 0.5  0 - 0.9   POC MID % 6.5  0 - 12 %M   POC Granulocyte 4.8  2 - 6.9   Granulocyte percent 60.0  37 - 80 %G   RBC 4.22  4.04 - 5.48 M/uL   Hemoglobin 10.3 (*) 12.2 - 16.2 g/dL   HCT, POC 40.9 (*) 81.1 - 47.9 %   MCV 78.8 (*) 80 - 97 fL   MCH, POC 24.4 (*) 27 - 31.2 pg   MCHC 30.9 (*) 31.8 - 35.4 g/dL   RDW, POC 91.4     Platelet Count, POC 234  142 - 424 K/uL   MPV 10.5  0 - 99.8 fL  GLUCOSE, POCT (MANUAL RESULT ENTRY)      Result Value Range   POC Glucose 190 (*) 70 - 99 mg/dl  POCT URINE PREGNANCY      Result Value Range   Preg Test, Ur Negative     12/24/12- called to check on her- communicated through her son.  She has stopped bleeding,  Let her know that her TSH is now normal .  If  she does not hear about her OBG appt please let me know

## 2012-12-21 NOTE — Patient Instructions (Addendum)
We will send your rx to your drug store.  We will call you next week about your OB- GYN appt.  If you are having trouble in the meantime please give me a call- for more severe bleeding, etc

## 2012-12-24 ENCOUNTER — Encounter: Payer: Self-pay | Admitting: Family Medicine

## 2013-01-07 ENCOUNTER — Telehealth: Payer: Self-pay

## 2013-01-07 NOTE — Telephone Encounter (Signed)
Pt is needing a refill on her medication for her bleeding, she doesn't know the name of her medication Call back number is [phone]

## 2013-01-07 NOTE — Telephone Encounter (Signed)
Pt is needing a refill on her medication that is for her bleeding she doesn't know the name of the medication Call back number is [phone]

## 2013-01-08 ENCOUNTER — Telehealth: Payer: Self-pay

## 2013-01-08 ENCOUNTER — Other Ambulatory Visit: Payer: Self-pay | Admitting: Family Medicine

## 2013-01-08 NOTE — Telephone Encounter (Signed)
[facility]  Still bleeding, is there a pill she can take or something to help tie her over.   [phone]

## 2013-01-09 NOTE — Telephone Encounter (Signed)
Can we send in another 10 days of Provera for her? She took Provera from 7/4 through 7/14 and it helped she has started bleeding again. [facility] appt with [facility] is next [facility]- she is unsure of the date.

## 2013-01-10 ENCOUNTER — Encounter (HOSPITAL_COMMUNITY): Payer: Self-pay | Admitting: *Deleted

## 2013-01-10 ENCOUNTER — Inpatient Hospital Stay (HOSPITAL_COMMUNITY)
Admission: AD | Admit: 2013-01-10 | Discharge: 2013-01-10 | Disposition: A | Discharge status: Home / Self Care | Payer: BC Managed Care – PPO | Source: Ambulatory Visit | Attending: Obstetrics & Gynecology | Admitting: Obstetrics & Gynecology

## 2013-01-10 ENCOUNTER — Inpatient Hospital Stay (HOSPITAL_COMMUNITY): Payer: BC Managed Care – PPO

## 2013-01-10 DIAGNOSIS — D649 Anemia, unspecified: Secondary | ICD-10-CM

## 2013-01-10 DIAGNOSIS — D500 Iron deficiency anemia secondary to blood loss (chronic): Secondary | ICD-10-CM | POA: Insufficient documentation

## 2013-01-10 DIAGNOSIS — N938 Other specified abnormal uterine and vaginal bleeding: Secondary | ICD-10-CM | POA: Insufficient documentation

## 2013-01-10 DIAGNOSIS — N949 Unspecified condition associated with female genital organs and menstrual cycle: Secondary | ICD-10-CM | POA: Insufficient documentation

## 2013-01-10 LAB — URINALYSIS, ROUTINE W REFLEX MICROSCOPIC
Leukocytes, UA: NEGATIVE
Nitrite: NEGATIVE
Specific Gravity, Urine: 1.02 (ref 1.005–1.030)
Urobilinogen, UA: 0.2 mg/dL (ref 0.0–1.0)
pH: 6 (ref 5.0–8.0)

## 2013-01-10 LAB — CBC WITH DIFFERENTIAL/PLATELET
Eosinophils Absolute: 0.3 10*3/uL (ref 0.0–0.7)
Eosinophils Relative: 4 % (ref 0–5)
Hemoglobin: 7.1 g/dL — ABNORMAL LOW (ref 12.0–15.0)
Lymphocytes Relative: 26 % (ref 12–46)
Lymphs Abs: 2 10*3/uL (ref 0.7–4.0)
MCH: 23.5 pg — ABNORMAL LOW (ref 26.0–34.0)
MCV: 72.8 fL — ABNORMAL LOW (ref 78.0–100.0)
Monocytes Relative: 9 % (ref 3–12)
RBC: 3.02 MIL/uL — ABNORMAL LOW (ref 3.87–5.11)
WBC: 7.8 10*3/uL (ref 4.0–10.5)

## 2013-01-10 LAB — WET PREP, GENITAL
Trich, Wet Prep: NONE SEEN
WBC, Wet Prep HPF POC: NONE SEEN

## 2013-01-10 MED ORDER — MEGESTROL ACETATE 40 MG PO TABS: ORAL_TABLET | ORAL | Status: DC

## 2013-01-10 NOTE — MAU Note (Signed)
Patient states that she had been having regular periods until she started bleeding about 2 months ago, Was seen at [facility] on 7[facility] and given medication to stop the bleeding, slowed it down but did not stop. Has had pain in the past but none now. Changing pads 1-2 time an hour. States she was called by an MD today and told to come and be seen.

## 2013-01-10 NOTE — MAU Provider Note (Signed)
Attestation of Attending Supervision of Advanced Practitioner (PA/CNM/NP): Evaluation and management procedures were performed by the Advanced Practitioner under my supervision and collaboration.  I have reviewed the Advanced Practitioner's note and chart, and I agree with the management and plan.  Emiah Pellicano, MD, FACOG Attending Obstetrician & Gynecologist Faculty Practice, [facility]  

## 2013-01-10 NOTE — MAU Provider Note (Signed)
History     CSN: [id number]  Arrival date and time: 01/10/13 1218   First Provider Initiated Contact with Patient 01/10/13 1301      Chief Complaint  Patient presents with  . Vaginal Bleeding   HPI Onda Kattner is 42 y.o. G3P3000  presenting with vaginal bleeding since  LMP 11/23/12 and has been bleeding since may stop for a day or two and then resume.  Had pain initially but none now.  Has had blood clots with clear discharge.  Denies dizziness or syncope.  Son reported was seen by [facility] on 12/21/12  for same reason, treated with Provera 10mg  X 10 days which reduced bleeding but then it came back.  HBG on that visit was 10.3. Son said the bleeding stopped after 6 days on med.  They called them today and they told them to come straight here.  She has an appointment in the [facility] for 8/15.  She is diabetic and has elevated cholesterol.  Past Medical History  Diagnosis Date  . Hyperlipidemia   . DM (diabetes mellitus)     Past Surgical History  Procedure Laterality Date  . Tubal ligation      History reviewed. No pertinent family history.  History  Substance Use Topics  . Smoking status: Never Smoker   . Smokeless tobacco: Not on file  . Alcohol Use: No    Allergies: No Known Allergies  Prescriptions prior to admission  Medication Sig Dispense Refill  . gemfibrozil (LOPID) 600 MG tablet Take 1 tablet (600 mg total) by mouth 2 (two) times daily.  60 tablet  5  . glimepiride (AMARYL) 2 MG tablet TAKE ONE-HALF TABLET BY MOUTH TWICE DAILY BEFORE MEALS  30 tablet  5  . medroxyPROGESTERone (PROVERA) 10 MG tablet Take 1 tablet (10 mg total) by mouth daily.  10 tablet  0  . metFORMIN (GLUCOPHAGE) 1000 MG tablet Take 1 tablet (1,000 mg total) by mouth 2 (two) times daily with a meal.  180 tablet  1    Review of Systems  Constitutional: Negative for fever and chills.  Gastrointestinal: Positive for abdominal pain (cramping several weeks ago). Negative for nausea and  vomiting.  Genitourinary: Negative for dysuria, urgency, frequency and hematuria.       + for vaginal bleeding with clot  Neurological: Negative for headaches.   Physical Exam   Blood pressure 122/82, pulse 103, temperature 98.4 F (36.9 C), resp. rate 16, last menstrual period 11/23/2012, SpO2 100.00%.  Physical Exam  Constitutional: She is oriented to person, place, and time. She appears well-developed and well-nourished. No distress.  HENT:  Head: Normocephalic.  Neck: Normal range of motion.  Cardiovascular: Normal rate.   Respiratory: Effort normal.  GI: Soft. She exhibits no distension and no mass. There is no tenderness. There is no rebound and no guarding.  Genitourinary: There is no rash, tenderness or lesion on the right labia. There is no rash, tenderness or lesion on the left labia. Uterus is enlarged (upper limit of normal). Uterus is not tender. Cervix exhibits friability. Cervix exhibits no discharge. Right adnexum displays no mass, no tenderness and no fullness. Left adnexum displays tenderness and fullness. There is bleeding around the vagina. No erythema or tenderness around the vagina. No vaginal discharge found.  Neurological: [name], place, and time.  Skin: Skin is warm and dry.  Psychiatric: She has a normal mood and affect. Her behavior is normal.  Results for orders placed during the hospital encounter of 01/10/13 (from the past 24 hour(s))  URINALYSIS, ROUTINE W REFLEX MICROSCOPIC     Status: Abnormal   Collection Time    01/10/13 12:24 PM      Result Value Range   Color, Urine YELLOW  YELLOW   APPearance HAZY (*) CLEAR   Specific Gravity, Urine 1.020  1.005 - 1.030   pH 6.0  5.0 - 8.0   Glucose, UA >1000 (*) NEGATIVE mg/dL   Hgb urine dipstick LARGE (*) NEGATIVE   Bilirubin Urine NEGATIVE  NEGATIVE   Ketones, ur NEGATIVE  NEGATIVE mg/dL   Protein, ur NEGATIVE  NEGATIVE mg/dL   Urobilinogen, UA 0.2  0.0 - 1.0 mg/dL    Nitrite NEGATIVE  NEGATIVE   Leukocytes, UA NEGATIVE  NEGATIVE  URINE MICROSCOPIC-ADD ON     Status: None   Collection Time    01/10/13 12:24 PM      Result Value Range   Squamous Epithelial / LPF RARE  RARE   RBC / HPF TOO NUMEROUS TO COUNT  <3 RBC/hpf  POCT PREGNANCY, URINE     Status: None   Collection Time    01/10/13 12:42 PM      Result Value Range   Preg Test, Ur NEGATIVE  NEGATIVE  WET PREP, GENITAL     Status: None   Collection Time    01/10/13  1:07 PM      Result Value Range   Yeast Wet Prep HPF POC NONE SEEN  NONE SEEN   Trich, Wet Prep NONE SEEN  NONE SEEN   Clue Cells Wet Prep HPF POC NONE SEEN  NONE SEEN   WBC, Wet Prep HPF POC NONE SEEN  NONE SEEN  CBC WITH DIFFERENTIAL     Status: Abnormal   Collection Time    01/10/13  2:02 PM      Result Value Range   WBC 7.8  4.0 - 10.5 K/uL   RBC 3.02 (*) 3.87 - 5.11 MIL/uL   Hemoglobin 7.1 (*) 12.0 - 15.0 g/dL   HCT 16.1 (*) 09.6 - 04.5 %   MCV 72.8 (*) 78.0 - 100.0 fL   MCH 23.5 (*) 26.0 - 34.0 pg   MCHC 32.3  30.0 - 36.0 g/dL   RDW 40.9  81.1 - 91.4 %   Platelets 311  150 - 400 K/uL   Neutrophils Relative % 61  43 - 77 %   Neutro Abs 4.8  1.7 - 7.7 K/uL   Lymphocytes Relative 26  12 - 46 %   Lymphs Abs 2.0  0.7 - 4.0 K/uL   Monocytes Relative 9  3 - 12 %   Monocytes Absolute 0.7  0.1 - 1.0 K/uL   Eosinophils Relative 4  0 - 5 %   Eosinophils Absolute 0.3  0.0 - 0.7 K/uL   Basophils Relative 1  0 - 1 %   Basophils Absolute 0.0  0.0 - 0.1 K/uL       *RADIOLOGY REPORT*  Clinical Data: Dysfunctional uterine bleeding. LMP 11/23/2012.  TRANSABDOMINAL AND TRANSVAGINAL ULTRASOUND OF PELVIS  Technique: Both transabdominal and transvaginal ultrasound  examinations of the pelvis were performed. Transabdominal  technique was performed for global imaging of the pelvis including  uterus, ovaries, adnexal regions, and pelvic cul-de-sac.  It was necessary to proceed with endovaginal exam following the  transabdominal  exam to visualize the endometrium and ovaries.  Comparison: None.  Findings:  Uterus: 9.9 x 6.2 x  8.2 cm. Heterogeneous echogenicity of the  uterine myometrium is noted. A small subserosal fibroid is seen in  the posterior corpus measuring 1.4 cm. No other distinct fibroids  visualized.  Endometrium: Double layer thickness measures 6 mm transvaginally.  No focal lesion visualized.  Right ovary: 2.3 x 2.1 x 2.4 cm. Normal appearance.  Left ovary: 5.4 x 3.5 x 3.2 cm. 3.8 cm simple cyst with benign  characteristics seen, which is likely physiologic.  Other Findings: No free fluid  IMPRESSION:  1. Single small posterior subserosal fibroid measuring 1.4 cm.  2. Endometrial thickness measures 6 mm. If bleeding remains  unresponsive to hormonal or medical therapy, sonohysterogram should  be considered for focal lesion work-up. (Ref: Radiological  Reasoning: Algorithmic Workup of Abnormal Vaginal Bleeding with  Endovaginal Sonography and Sonohysterography. AJR 2008; [id number]:W96-04)  3. 3.8 cm benign appearing left ovarian cyst, likely physiologic.  Original Report Authenticated By: Myles Rosenthal, M.D.     MAU Course  Procedures  MDM Called Dr. Macon Large to discuss management.  She is in OR and will call when she is out.  I reported this to the patient's son.  They are ok with waiting.  Dr. Macon Large called back.  HPI, labs and U/S report given.  Order given for Megace 40mg  tid X 4 days, 40mg  bid for 4 days then daily until seen in the clinic.  She should begin Ferrous sulfate daily and colace if needed.   Information given to Son who is fluent in [place].  Assessment and Plan  A:  Dysfunctional Uterine Bleeding      Anemia probably due to blood loss  P;  Megace 40mg  tab tid X 4 days, then bid X 4 days then qd until seen in the clinic     Patient instructed to take Ferrous Sulfate qd--colace if constipated     Keep scheduled appointment in the clinic  Orly Quimby,EVE M 01/10/2013, 1:02 PM

## 2013-01-10 NOTE — Telephone Encounter (Signed)
Called back and spoke to her son.  I am so sorry for the delay, I just received this message last night.  Shelley Escobar is having bleeding again- now apparently so heavy that she has to change her pad hourly at least.  Asked him to please bring her to clinic today or to the [facility] for evaluation.  He agreed to do so.

## 2013-01-10 NOTE — MAU Note (Signed)
[employer] [facility] used for medical history review. Patient states feels dizzy with a slight headache at this present time.

## 2013-01-11 LAB — GC/CHLAMYDIA PROBE AMP
CT Probe RNA: NEGATIVE
GC Probe RNA: NEGATIVE

## 2013-02-04 ENCOUNTER — Encounter: Payer: Self-pay | Admitting: Obstetrics & Gynecology

## 2013-02-04 ENCOUNTER — Other Ambulatory Visit (HOSPITAL_COMMUNITY)
Admission: RE | Admit: 2013-02-04 | Discharge: 2013-02-04 | Disposition: A | Discharge status: Home / Self Care | Payer: BC Managed Care – PPO | Source: Ambulatory Visit | Attending: Obstetrics & Gynecology | Admitting: Obstetrics & Gynecology

## 2013-02-04 ENCOUNTER — Ambulatory Visit (INDEPENDENT_AMBULATORY_CARE_PROVIDER_SITE_OTHER): Payer: BC Managed Care – PPO | Admitting: Obstetrics & Gynecology

## 2013-02-04 VITALS — BP 120/78 | HR 91 | Temp 98.0°F | Ht 63.5 in | Wt 169.4 lb

## 2013-02-04 DIAGNOSIS — D500 Iron deficiency anemia secondary to blood loss (chronic): Secondary | ICD-10-CM

## 2013-02-04 DIAGNOSIS — N949 Unspecified condition associated with female genital organs and menstrual cycle: Secondary | ICD-10-CM

## 2013-02-04 DIAGNOSIS — N938 Other specified abnormal uterine and vaginal bleeding: Secondary | ICD-10-CM

## 2013-02-04 DIAGNOSIS — N925 Other specified irregular menstruation: Secondary | ICD-10-CM

## 2013-02-04 DIAGNOSIS — N920 Excessive and frequent menstruation with regular cycle: Secondary | ICD-10-CM

## 2013-02-04 DIAGNOSIS — N8501 Benign endometrial hyperplasia: Secondary | ICD-10-CM | POA: Insufficient documentation

## 2013-02-04 LAB — POCT PREGNANCY, URINE
Preg Test, Ur: NEGATIVE
Preg Test, Ur: NEGATIVE

## 2013-02-04 LAB — CBC
Hemoglobin: 9.6 g/dL — ABNORMAL LOW (ref 12.0–15.0)
MCH: 21.4 pg — ABNORMAL LOW (ref 26.0–34.0)
MCHC: 30.9 g/dL (ref 30.0–36.0)
Platelets: 479 10*3/uL — ABNORMAL HIGH (ref 150–400)
RBC: 4.48 MIL/uL (ref 3.87–5.11)

## 2013-02-04 MED ORDER — MEGESTROL ACETATE 40 MG PO TABS: ORAL_TABLET | ORAL | Status: DC

## 2013-02-04 MED ORDER — INTEGRA F 125-1 MG PO CAPS: 1.0000 | ORAL_CAPSULE | Freq: Every day | ORAL | Status: DC

## 2013-02-04 NOTE — Progress Notes (Signed)
Patient ID: Shelley Escobar, female   DOB: Nov 25, 1970, 42 y.o.   MRN: [id number] Pt is a 51 or 42 yo female (she is unsure of the year of her birth).  [id number].  She was seen in the [facility] for menorrhagia.  She has been on Megace since that time and the bleeding has resolved. CBC    Component Value Date/Time   WBC 7.8 01/10/2013 1402   WBC 8.0 12/21/2012 1449   RBC 3.02* 01/10/2013 1402   RBC 4.22 12/21/2012 1449   HGB 7.1* 01/10/2013 1402   HGB 10.3* 12/21/2012 1449   HCT 22.0* 01/10/2013 1402   HCT 33.3* 12/21/2012 1449   PLT 311 01/10/2013 1402   MCV 72.8* 01/10/2013 1402   MCV 78.8* 12/21/2012 1449   MCH 23.5* 01/10/2013 1402   MCH 24.4* 12/21/2012 1449   MCHC 32.3 01/10/2013 1402   MCHC 30.9* 12/21/2012 1449   RDW 13.1 01/10/2013 1402   LYMPHSABS 2.0 01/10/2013 1402   MONOABS 0.7 01/10/2013 1402   EOSABS 0.3 01/10/2013 1402   BASOSABS 0.0 01/10/2013 1402    She is here for f/u.  Due to her history and the amount of her bleeding an endobx was performed. The indications for endometrial biopsy were reviewed.   Risks of the biopsy including cramping, bleeding, infection, uterine perforation, inadequate specimen and need for additional procedures  were discussed. The patient states she understands and agrees to undergo procedure today. Consent was signed. Time out was performed. Urine HCG was negative. A sterile speculum was placed in the patient's vagina and the cervix was prepped with Betadine. A single-toothed tenaculum was placed on the anterior lip of the cervix to stabilize it. The 3 mm pipelle was introduced into the endometrial cavity without difficulty to a depth of 10cm, and a moderate amount of tissue was obtained and sent to pathology. The instruments were removed from the patient's vagina. Minimal bleeding from the cervix was noted. The patient tolerated the procedure well. Routine post-procedure instructions were given to the patient. The patient will follow up to review the results and for further management.     Keep Megace 40mg  q day Integra 1 po q day F/u 3 months D/w pt Vag hyst but, need to improve blood indices first

## 2013-02-04 NOTE — Patient Instructions (Addendum)
Thng Tin v? Th? Thu?t C?t B? T? Cung  (Hysterectomy Information)  Th? thu?t c?t b? t? cung l m?t th? thu?t m t? cung ???c ph?u thu?t ?? c?t b?. Ng??i b? c?t b? s? khng th? c chu k? kinh nguy?t ho?c mang thai. ?ng v bu?ng tr?ng c?ng c th? ???c c?t b? trong qu trnh ph?u thu?t ny.  L DO C?T B? T? CUNG   Ch?y mu dai d?ng, b?t th??ng.  ?au ho?c nhi?m trng vng ch?u ko di (mn tnh).  Mng t? cung (n?i m?c t? cung) b?t ??u pht tri?n ra ngoi t? cung (b?nh l?c n?i m?c t? cung).  N?i m?c t? cung b?t ??u pht tri?n trong c? c?a t? cung (b?nh l?c mng trong t? cung).  T? cung r?i vo m ??o (sa c? quan vng ch?u).  U x? t? cung tri?u ch?ng.  Cc t? bo ti?n ung th?.  Ung th? c? t? cung ho?c ung th? t? cung. CC LO?I TH? THU?T C?T B? T? CUNG   Th? thu?t c?t b? t? cung trn c? t? cung. Lo?i ny c?t b? ph?n trn c?a t? cung, nh?ng khng c?t b? c? t? cung.  Th? thu?t c?t b? t? cung hon ton. Lo?i ny c?t b? t? cung v c? t? cung.  Th? thu?t c?t b? t? cung tri?t c?n. Lo?i ny c?t b? t? cung, c? t? cung v cc m x? gi? t? cung t?i ch? trong x??ng ch?u (m c?n t? cung). CCH C TH? TH?C HI?N C?T B? T? CUNG   C?t b? t? cung t? b?ng. M?t v?t r?ch l?n (v?t m?) ???c r?ch ? b?ng. T? cung ???c c?t b? thng qua v?t m? ny.  C?t b? t? cung qua m ??o. M?t v?t m? ???c r?ch trong m ??o. T? cung ???c c?t b? thng qua v?t m? ny. Khng c v?t m? ? b?ng.  C?t b? t? cung b?ng ph??ng php n?i soi thng th??ng. M?t ?ng m?ng, ???c chi?u sng c camera (?ng soi ? b?ng) ???c ??a vo 3 ho?c 4 v?t m? nh? ? b?ng. T? cung ???c c?t thnh t?ng m?u nh?. Cc m?u nh? ny ???c l?y ra thng qua cc v?t m? ho?c qua m ??o.  C?t b? t? cung qua m ??o ???c h? tr? b?ng n?i soi ([facility]). Ba ho?c b?n v?t m? nh? ???c r?ch ? b?ng. M?t ph?n c?a cu?c ph?u thu?t ???c th?c hi?n b?ng ph??ng php soi ? b?ng v m?t ph?n qua m ??o. T? cung ???c c?t b? thng qua m ??o.  C?t b? t? cung b?ng ph??ng php n?i soi c r b?t h?  tr?. M?t ?ng soi ? b?ng ???c ??a vo 3 ho?c 4 v?t m? nh? ? b?ng. M?t thi?t b? ?i?u khi?n b?ng my tnh ???c s? d?ng ?? cung c?p cho bc s? ph?u thu?t hnh ?nh 3D. ?i?u ny cho php d?ch chuy?n cc d?ng c? ph?u thu?t chnh xc h?n. T? cung ???c c?t thnh t?ng m?u nh? v l?y ra thng qua cc v?t m? ho?c qua m ??o. R?I RO C?A TH? THU?T C?T B? T? CUNG   Ch?y mu v nguy c? truy?n mu. Cho chuyn gia ch?m [place] y t? c?a b?n bi?t n?u b?n khng mu?n nh?n b?t k? s?n ph?m mu no.  C?c mu ?ng ? chn ho?c ph?i.  Nhi?m trng.  Ch?n th??ng xung quanh cc c? quan.  V?n ?? gy m ho?c tc d?ng ph?.  Chuy?n ??i thnh c?t  b? t? cung t? b?ng. NH?NG G MONG ??I SAU TH? THU?T C?T B? T? CUNG   B?n s? ???c cho s? d?ng thu?c gi?m ?au.  B?n s? c?n ph?i c ai ? ? cng mnh trong 3 ??n 5 ngy ??u tin sau khi v? nh.  B?n s? c?n ph?i g?p bc s? ph?u thu?t ?? khm l?i sau 2 ??n 4 tu?n sau khi ph?u thu?t ?? ?nh gi s? ti?n tri?n c?a b?n.  B?n c th? c cc tri?u ch?ng mn kinh s?m nh? nng nhanh, ?? m? hi ban ?m v m?t ng?.  N?u b?n ???c c?t b? t? cung cho m?t v?n ?? khng ph?i ung th? ho?c m?t ?i?u ki?n c th? d?n ??n ung th?, khi ? b?n khng cn c?n xt nghi?m Pap. Tuy nhin, ngay c? khi b?n khng cn c?n m?t xt nghi?m Pap, b?n nn khm ??nh k? ?? ??m b?o khng c v?n ?? no khc ?ang b?t ??u. Document Released: 05/26/2011 Document Revised: 08/29/2011 [facility] Patient Information 2014 [place], [place].

## 2013-02-04 NOTE — Addendum Note (Signed)
Addended by: Gerome Apley on: 02/04/2013 04:56 PM   Modules accepted: Orders

## 2013-02-08 ENCOUNTER — Telehealth: Payer: Self-pay | Admitting: *Deleted

## 2013-02-08 NOTE — Telephone Encounter (Signed)
Pt's son called and left message stating that his mother continues to have abnormal bleeding since her clinic visit on 8/18. He would like a call back.

## 2013-02-11 ENCOUNTER — Telehealth: Payer: Self-pay

## 2013-02-11 NOTE — Telephone Encounter (Signed)
Opened in error

## 2013-02-11 NOTE — Telephone Encounter (Signed)
02/09/13 @ 1159am- Pt's son called back indicating mother continues to bleed since last office visit.  Called pt's son and asked how the pt has been taking the medication.  Son informed me that she has been taking it once a day.  Looking at Dr. Danne Harbor orders she wanted pt to take one tablet tid x 4 days then one tablet bid x 4 days then qd.  Son stated "o, we did not do that"  I advised son to have pt take medication as prescribed and they should see a change in her bleeding.  Pt's son stated "ok".  I asked if he had written the instructions down he stated yes and he was able to repeat back to me how the medication is to be taken.

## 2013-02-15 ENCOUNTER — Encounter: Payer: Self-pay | Admitting: *Deleted

## 2013-03-20 ENCOUNTER — Encounter: Payer: BC Managed Care – PPO | Admitting: Family Medicine

## 2013-04-10 ENCOUNTER — Encounter: Payer: Self-pay | Admitting: Family Medicine

## 2013-04-10 ENCOUNTER — Ambulatory Visit (INDEPENDENT_AMBULATORY_CARE_PROVIDER_SITE_OTHER): Payer: BC Managed Care – PPO | Admitting: Family Medicine

## 2013-04-10 VITALS — BP 120/76 | HR 80 | Temp 98.2°F | Resp 16 | Ht 63.5 in | Wt 171.2 lb

## 2013-04-10 DIAGNOSIS — E785 Hyperlipidemia, unspecified: Secondary | ICD-10-CM

## 2013-04-10 DIAGNOSIS — Z23 Encounter for immunization: Secondary | ICD-10-CM

## 2013-04-10 DIAGNOSIS — IMO0001 Reserved for inherently not codable concepts without codable children: Secondary | ICD-10-CM

## 2013-04-10 DIAGNOSIS — D500 Iron deficiency anemia secondary to blood loss (chronic): Secondary | ICD-10-CM

## 2013-04-10 DIAGNOSIS — Z760 Encounter for issue of repeat prescription: Secondary | ICD-10-CM

## 2013-04-10 MED ORDER — GLIMEPIRIDE 2 MG PO TABS: ORAL_TABLET | ORAL | Status: DC

## 2013-04-10 MED ORDER — GEMFIBROZIL 600 MG PO TABS: 600.0000 mg | ORAL_TABLET | Freq: Two times a day (BID) | ORAL | Status: DC

## 2013-04-10 MED ORDER — METFORMIN HCL 1000 MG PO TABS: 1000.0000 mg | ORAL_TABLET | Freq: Two times a day (BID) | ORAL | Status: DC

## 2013-04-10 MED ORDER — SITAGLIPTIN PHOS-METFORMIN HCL 50-1000 MG PO TABS: 1.0000 | ORAL_TABLET | Freq: Two times a day (BID) | ORAL | Status: DC

## 2013-04-10 MED ORDER — INTEGRA F 125-1 MG PO CAPS: 1.0000 | ORAL_CAPSULE | Freq: Every day | ORAL | Status: DC

## 2013-04-10 NOTE — Patient Instructions (Addendum)
Diabetes is not well controlled; try to change some of the foods that you eat and eat less rice and starchy foods. Continue to drink plenty of water and stay active. I will see you again in 4 months for a complete physical and to recheck Diabetes numbers. I have changed both of your medications : Glimepiride 2 mg tablets  1 whole tablet twice a day;  New medication is Janumet (combination of 2 medications)  Take 1 tablet twice a day with meals (same as when you took Metformin).

## 2013-04-10 NOTE — Progress Notes (Signed)
S:  This 42 y.o. Asian female has Type II DM, less than optimum control w/ last A1c= 8.2% in May 2014. Son states pt eats balanced diet but maybe too much rice, which is a staple of her diet. She is complaint w/ medications and stays active. She drinks only water. Pt does not report fatigue, diaphoresis, vision disturbances, CP or palpitations, GI problems, excessive thirst or urination, myalgias, skin changes, Janusz, dizziness, numbness or syncope.  Patient Active Problem List   Diagnosis Date Noted  . Obesity (BMI 30.0-34.9) 01/27/2012  . DM (diabetes mellitus), type 2 07/02/2011  . Dyslipidemia 07/02/2011   PMHx, Soc Hx and Fam Hx reviewed. Medications reconciled.  ROS: As per HPI.  O: Filed Vitals:   04/10/13 1029  BP: 120/76  Pulse: 80  Temp: 98.2 F (36.8 C)  Resp: 16   GEN: In NAD; WN,WD. HENT: [place]/AT; EOMI w/ clear conj/sclerae. Otherwise unremarkable. COR: RRR. No edema. LUNGS: Nomral resp rate and effort. SKIN: W&D; intact w/o rashes, erythema, lesions or pallor. See Dm foot exam- skin of plantar aspect is dry and slightly cracked on heels. NEURO: A&O x 3; CNs intact. Nonfocal.  Results for orders placed in visit on 04/10/13  POCT GLYCOSYLATED HEMOGLOBIN (HGB A1C)      Result Value Range   Hemoglobin A1C 11.9      A/P: Type II or unspecified type diabetes mellitus without mention of complication, uncontrolled - Reviewed following changes w/ pt and her son; explained significance of A1c test.  Increase Glimepiride 2 mg to 1 tablet twice a day before meals.  Change medication from Metformin to Janumet (generic) 50-1000 mg  1 tablet twice daily after meals.  Plan: HM Diabetes Foot Exam, POCT glycosylated hemoglobin (Hb A1C)  Hyperlipidemia - Plan: gemfibrozil (LOPID) 600 MG tablet  Issue of repeat prescriptions  Anemia due to blood loss - Plan: Fe Fum-FePoly-FA-Vit C-Vit B3 (INTEGRA F) 125-1 MG CAPS  Meds ordered this encounter  Medications  . Fe Fum-FePoly-FA-Vit  C-Vit B3 (INTEGRA F) 125-1 MG CAPS    Sig: Take 1 capsule by mouth daily.    Dispense:  30 capsule    Refill:  5  . gemfibrozil (LOPID) 600 MG tablet    Sig: Take 1 tablet (600 mg total) by mouth 2 (two) times daily.    Dispense:  60 tablet    Refill:  5  . DISCONTD: glimepiride (AMARYL) 2 MG tablet    Sig: TAKE ONE-HALF TABLET BY MOUTH TWICE DAILY BEFORE MEALS    Dispense:  30 tablet    Refill:  5  . DISCONTD: metFORMIN (GLUCOPHAGE) 1000 MG tablet    Sig: Take 1 tablet (1,000 mg total) by mouth 2 (two) times daily with a meal.    Dispense:  180 tablet    Refill:  1  . glimepiride (AMARYL) 2 MG tablet    Sig: TAKE ONE TABLET BY MOUTH TWICE DAILY BEFORE MEALS    Dispense:  60 tablet    Refill:  5  . sitaGLIPtin-metformin (JANUMET) 50-1000 MG per tablet    Sig: Take 1 tablet by mouth 2 (two) times daily with a meal.    Dispense:  60 tablet    Refill:  5

## 2013-05-10 ENCOUNTER — Encounter: Payer: Self-pay | Admitting: Gynecology

## 2013-05-13 ENCOUNTER — Encounter: Payer: Self-pay | Admitting: Gynecology

## 2013-05-13 ENCOUNTER — Ambulatory Visit (INDEPENDENT_AMBULATORY_CARE_PROVIDER_SITE_OTHER): Payer: BC Managed Care – PPO | Admitting: Gynecology

## 2013-05-13 VITALS — BP 120/82 | HR 70 | Resp 14 | Ht 63.75 in | Wt 169.0 lb

## 2013-05-13 DIAGNOSIS — Z0000 Encounter for general adult medical examination without abnormal findings: Secondary | ICD-10-CM

## 2013-05-13 DIAGNOSIS — N949 Unspecified condition associated with female genital organs and menstrual cycle: Secondary | ICD-10-CM

## 2013-05-13 DIAGNOSIS — N762 Acute vulvitis: Secondary | ICD-10-CM

## 2013-05-13 DIAGNOSIS — N760 Acute vaginitis: Secondary | ICD-10-CM

## 2013-05-13 DIAGNOSIS — N938 Other specified abnormal uterine and vaginal bleeding: Secondary | ICD-10-CM

## 2013-05-13 DIAGNOSIS — Z01419 Encounter for gynecological examination (general) (routine) without abnormal findings: Secondary | ICD-10-CM

## 2013-05-13 LAB — POCT URINALYSIS DIPSTICK
Urobilinogen, UA: NEGATIVE
pH, UA: 5

## 2013-05-13 LAB — HEMOGLOBIN, FINGERSTICK: Hemoglobin, fingerstick: 13 g/dL (ref 12.0–16.0)

## 2013-05-13 MED ORDER — MEGESTROL ACETATE 40 MG PO TABS: ORAL_TABLET | ORAL | Status: DC

## 2013-05-13 MED ORDER — TRIAMCINOLONE ACETONIDE 0.5 % EX OINT: 1.0000 "application " | TOPICAL_OINTMENT | Freq: Two times a day (BID) | CUTANEOUS | Status: DC

## 2013-05-13 NOTE — Patient Instructions (Signed)
Hysteroscopy Hysteroscopy is a procedure used for looking inside the womb (uterus). It may be done for many different reasons, including:  To evaluate abnormal bleeding, fibroid (benign, noncancerous) tumors, polyps, scar tissue (adhesions), and possibly cancer of the uterus.  To look for lumps (tumors) and other uterine growths.  To look for causes of why a woman cannot get pregnant (infertility), causes of recurrent loss of pregnancy (miscarriages), or a lost intrauterine device (IUD).  To perform a sterilization by blocking the fallopian tubes from inside the uterus. A hysteroscopy should be done right after a menstrual period to be sure you are not pregnant. LET YOUR CAREGIVER KNOW ABOUT:   Allergies.  Medicines taken, including herbs, eyedrops, over-the-counter medicines, and creams.  Use of steroids (by mouth or creams).  Previous problems with anesthetics or numbing medicines.  History of bleeding or blood problems.  History of blood clots.  Possibility of pregnancy, if this applies.  Previous surgery.  Other health problems. RISKS AND COMPLICATIONS   Putting a hole in the uterus.  Excessive bleeding.  Infection.  Damage to the cervix.  Injury to other organs.  Allergic reaction to medicines.  Too much fluid used in the uterus for the procedure. BEFORE THE PROCEDURE   Do not take aspirin or blood thinners for a week before the procedure, or as directed. It can cause bleeding.  Arrive at least 60 minutes before the procedure or as directed to read and sign the necessary forms.  Arrange for someone to take you home after the procedure.  If you smoke, do not smoke for 2 weeks before the procedure. PROCEDURE   Your caregiver may give you medicine to relax you. He or she may also give you a medicine that numbs the area around the cervix (local anesthetic) or a medicine that makes you sleep (general anesthesia).  Sometimes, a medicine is placed in the cervix  the day before the procedure. This medicine makes the cervix have a larger opening (dilate). This makes it easier for the instrument to be inserted into the uterus.  A small instrument (hysteroscope) is inserted through the vagina into the uterus. This instrument is similar to a pencil-sized telescope with a light.  During the procedure, air or a liquid is put into the uterus, which allows the surgeon to see better.  Sometimes, tissue is gently scraped from inside the uterus. These tissue samples are sent to a specialist who looks at tissue samples (pathologist). The pathologist will give a report to your caregiver. This will help your caregiver decide if further treatment is necessary. The report will also help your caregiver decide on the best treatment if the test comes back abnormal. AFTER THE PROCEDURE   If you had a general anesthetic, you may be groggy for a couple hours after the procedure.  If you had a local anesthetic, you will be advised to rest at the surgical center or caregiver's office until you are stable and feel ready to go home.  You may have some cramping for a couple days.  You may have bleeding, which varies from light spotting for a few days to menstrual-like bleeding for up to 3 to 7 days. This is normal.  Have someone take you home. FINDING OUT THE RESULTS OF YOUR TEST Not all test results are available during your visit. If your test results are not back during the visit, make an appointment with your caregiver to find out the results. Do not assume everything is normal if you   have not heard from your caregiver or the medical facility. It is important for you to follow up on all of your test results. HOME CARE INSTRUCTIONS   Do not drive for 24 hours or as instructed.  Only take over-the-counter or prescription medicines for pain, discomfort, or fever as directed by your caregiver.  Do not take aspirin. It can cause or aggravate bleeding.  Do not drive or drink  alcohol while taking pain medicine.  You may resume your usual diet.  Do not use tampons, douche, or have sexual intercourse for 2 weeks, or as advised by your caregiver.  Rest and sleep for the first 24 to 48 hours.  Take your temperature twice a day for 4 to 5 days. Write it down. Give these temperatures to your caregiver if they are abnormal (above 98.6 F or 37.0 C).  Take medicines your caregiver has ordered as directed.  Follow your caregiver's advice regarding diet, exercise, lifting, driving, and general activities.  Take showers instead of baths for 2 weeks, or as recommended by your caregiver.  If you develop constipation:  Take a mild laxative with the advice of your caregiver.  Eat bran foods.  Drink enough water and fluids to keep your urine clear or pale yellow.  Try to have someone with you or available to you for the first 24 to 48 hours, especially if you had a general anesthetic.  Make sure you and your family understand everything about your operation and recovery.  Follow your caregiver's advice regarding follow-up appointments and Pap smears. SEEK MEDICAL CARE IF:   You feel dizzy or lightheaded.  You feel sick to your stomach (nauseous).  You develop abnormal vaginal discharge.  You develop a rash.  You have an abnormal reaction or allergy to your medicine.  You need stronger pain medicine. SEEK IMMEDIATE MEDICAL CARE IF:   Bleeding is heavier than a normal menstrual period or you have blood clots.  You have an oral temperature above 102 F (38.9 C), not controlled by medicine.  You have increasing cramps or pains not relieved with medicine.  You develop belly (abdominal) pain that does not seem to be related to the same area of earlier cramping and pain.  You pass out.  You develop pain in the tops of your shoulders (shoulder strap areas).  You develop shortness of breath. MAKE SURE YOU:   Understand these instructions.  Will watch  your condition.  Will get help right away if you are not doing well or get worse. Document Released: 09/12/2000 Document Revised: 08/29/2011 Document Reviewed: 01/03/2013 [facility] Patient Information 2014 [place], [place].  Levonorgestrel intrauterine device (IUD) What is this medicine? LEVONORGESTREL IUD (LEE voe nor jes trel) is a contraceptive (birth control) device. The device is placed inside the uterus by a healthcare professional. It is used to prevent pregnancy and can also be used to treat heavy bleeding that occurs during your period. Depending on the device, it can be used for 3 to 5 years. This medicine may be used for other purposes; ask your health care provider or pharmacist if you have questions. COMMON BRAND NAME(S): Gretta Cool What should I tell my health care provider before I take this medicine? They need to know if you have any of these conditions: -abnormal Pap smear -cancer of the breast, uterus, or cervix -diabetes -endometritis -genital or pelvic infection now or in the past -have more than one sexual partner or your partner has more than one partner -heart disease -  history of an ectopic or tubal pregnancy -immune system problems -IUD in place -liver disease or tumor -problems with blood clots or take blood-thinners -use intravenous drugs -uterus of unusual shape -vaginal bleeding that has not been explained -an unusual or allergic reaction to levonorgestrel, other hormones, silicone, or polyethylene, medicines, foods, dyes, or preservatives -pregnant or trying to get pregnant -breast-feeding How should I use this medicine? This device is placed inside the uterus by a health care professional. Talk to your pediatrician regarding the use of this medicine in children. Special care may be needed. Overdosage: If you think you have taken too much of this medicine contact a poison control center or emergency room at once. NOTE: This medicine is only for you. Do  not share this medicine with others. What if I miss a dose? This does not apply. What may interact with this medicine? Do not take this medicine with any of the following medications: -amprenavir -bosentan -fosamprenavir This medicine may also interact with the following medications: -aprepitant -barbiturate medicines for inducing sleep or treating seizures -bexarotene -griseofulvin -medicines to treat seizures like carbamazepine, ethotoin, felbamate, oxcarbazepine, phenytoin, topiramate -modafinil -pioglitazone -rifabutin -rifampin -rifapentine -some medicines to treat HIV infection like atazanavir, indinavir, lopinavir, nelfinavir, tipranavir, ritonavir -St. John's wort -warfarin This list may not describe all possible interactions. Give your health care provider a list of all the medicines, herbs, non-prescription drugs, or dietary supplements you use. Also tell them if you smoke, drink alcohol, or use illegal drugs. Some items may interact with your medicine. What should I watch for while using this medicine? Visit your doctor or health care professional for regular check ups. See your doctor if you or your partner has sexual contact with others, becomes HIV positive, or gets a sexual transmitted disease. This product does not protect you against HIV infection (AIDS) or other sexually transmitted diseases. You can check the placement of the IUD yourself by reaching up to the top of your vagina with clean fingers to feel the threads. Do not pull on the threads. It is a good habit to check placement after each menstrual period. Call your doctor right away if you feel more of the IUD than just the threads or if you cannot feel the threads at all. The IUD may come out by itself. You may become pregnant if the device comes out. If you notice that the IUD has come out use a backup birth control method like condoms and call your health care provider. Using tampons will not change the  position of the IUD and are okay to use during your period. What side effects may I notice from receiving this medicine? Side effects that you should report to your doctor or health care professional as soon as possible: -allergic reactions like skin rash, itching or hives, swelling of the face, lips, or tongue -fever, flu-like symptoms -genital sores -high blood pressure -no menstrual period for 6 weeks during use -pain, swelling, warmth in the leg -pelvic pain or tenderness -severe or sudden headache -signs of pregnancy -stomach cramping -sudden shortness of breath -trouble with balance, talking, or walking -unusual vaginal bleeding, discharge -yellowing of the eyes or skin Side effects that usually do not require medical attention (report to your doctor or health care professional if they continue or are bothersome): -acne -breast pain -change in sex drive or performance -changes in weight -cramping, dizziness, or faintness while the device is being inserted -headache -irregular menstrual bleeding within first 3 to 6 months of use -  nausea This list may not describe all possible side effects. Call your doctor for medical advice about side effects. You may report side effects to FDA at 1-800-FDA-1088. Where should I keep my medicine? This does not apply. NOTE: This sheet is a summary. It may not cover all possible information. If you have questions about this medicine, talk to your doctor, pharmacist, or health care provider.  2014, [employer]. (2011-07-07 13:54:04)

## 2013-05-13 NOTE — Progress Notes (Signed)
42 y.o. Married Asian female   [phone] here for annual exam. Pt is not currently sexually active.  Pt had been seen in [facility] clinic for DUB in 8/14, she was treated with megace and iron therapy.  She has not been able to taper down on the progestin due to break thru bleeding, she is currently maintained on BID.  Pt had had an u/s that was unremarkable and an emb that showed polypoid endometrium with progestaional affect.  Pt reports that she ran out of megace and has been off for 1w, no bleeding yet.   Patient's last menstrual period was 11/18/2012.          Sexually active: no  The current method of family planning is tubal ligation.    Exercising: yes  Walks qd  Last pap: Alcohol: no Tobacco: no BSE: no  Monthly Mammogram: 5/14 BiRADS 1  Hgb: PCP ; Urine:  Glucose, Trace Leuks  Health Maintenance  Topic Date Due  . Mammogram  11/05/2013  . Influenza Vaccine  01/18/2014  . Pap Smear  02/05/2016  . Tetanus/tdap  09/22/2019    History reviewed. No pertinent family history.  Patient Active Problem List   Diagnosis Date Noted  . Obesity (BMI 30.0-34.9) 01/27/2012  . DM (diabetes mellitus), type 2 07/02/2011  . Dyslipidemia 07/02/2011    Past Medical History  Diagnosis Date  . Hyperlipidemia   . DM (diabetes mellitus)   . Vitamin D deficiency     Past Surgical History  Procedure Laterality Date  . Tubal ligation      Age 42    Allergies: Review of patient's allergies indicates no known allergies.  Current Outpatient Prescriptions  Medication Sig Dispense Refill  . gemfibrozil (LOPID) 600 MG tablet Take 1 tablet (600 mg total) by mouth 2 (two) times daily.  60 tablet  5  . glimepiride (AMARYL) 2 MG tablet TAKE ONE TABLET BY MOUTH TWICE DAILY BEFORE MEALS  60 tablet  5  . metFORMIN (GLUCOPHAGE) 500 MG tablet Take by mouth 2 (two) times daily with a meal.      . sitaGLIPtin-metformin (JANUMET) 50-1000 MG per tablet Take 1 tablet by mouth 2 (two) times daily with a meal.  60  tablet  5  . Fe Fum-FePoly-FA-Vit C-Vit B3 (INTEGRA F) 125-1 MG CAPS Take 1 capsule by mouth daily.  30 capsule  5  . megestrol (MEGACE) 40 MG tablet Take 1 tablet tid X 4 days, then 1 tablet bid X 4 days, then daily until see in clinic  30 tablet  6   No current facility-administered medications for this visit.    ROS: Pertinent items are noted in HPI.  Exam:    BP 120/82  Pulse 70  Resp 14  Ht 5' 3.75" (1.619 m)  Wt 169 lb (76.658 kg)  BMI 29.25 kg/m2  LMP 11/18/2012 Weight change: @WEIGHTCHANGE @ Last 3 height recordings:  Ht Readings from Last 3 Encounters:  05/13/13 5' 3.75" (1.619 m)  04/10/13 5' 3.5" (1.613 m)  02/04/13 5' 3.5" (1.613 m)   General appearance: alert, cooperative and appears stated age Head: Normocephalic, without obvious abnormality, atraumatic Neck: no adenopathy, no carotid bruit, no JVD, supple, symmetrical, trachea midline and thyroid not enlarged, symmetric, no tenderness/mass/nodules Lungs: clear to auscultation bilaterally Breasts: normal appearance, no masses or tenderness Heart: regular rate and rhythm, S1, S2 normal, no murmur, click, rub or gallop Abdomen: soft, non-tender; bowel sounds normal; no masses,  no organomegaly Extremities: extremities normal, atraumatic, no cyanosis  or edema Skin: Skin color, texture, turgor normal. No rashes or lesions Lymph nodes: Cervical, supraclavicular, and axillary nodes normal. no inguinal nodes palpated Neurologic: Grossly normal   Pelvic: External genitalia:  laceration on outer labia majora on left, right normal              Urethra: normal appearing urethra with no masses, tenderness or lesions              Bartholins and Skenes: normal                 Vagina: normal appearing vagina with normal color and discharge, no lesions              Cervix: normal appearance              Pap taken: no        Bimanual Exam:  Uterus:  mobile, globular                                      Adnexa:    no masses                                       Rectovaginal: Confirms                                      Anus:  normal sphincter tone, no lesions  A: well woman DUB on megace unable to taper off diabetes     P: reviewed lab and visits from [facility] clinic. Pt had u/s and images reviewed with son-translator-pt's biopsy suspicious for polyp, offered SHG here or D&C hysteroscopy.  Concerns regarding her inability to be maintained on single dose or off progestin discussed.  Information given, son explained to mother and will discuss with other siblings, but would prefer surgery.  We discussed placing an IUD after pending pathology. Also discussed was hysterectomy, I suspect she has adenomyosis, and ablation.  We will start with hysteroscopy. No pap done Hb today Kenalog ointment to labia, avoid abrasive toilet paper, to call if not imrpoved F/u with PCP regarding diabetes and hypercholesterolemia Questions addressed  Additional 11m spent discussing recent DUB, treatment and management going forward, >50% face to face An After Visit Summary was printed and given to the patient.

## 2013-05-20 ENCOUNTER — Encounter: Payer: Self-pay | Admitting: *Deleted

## 2013-05-22 ENCOUNTER — Other Ambulatory Visit: Payer: Self-pay | Admitting: Family Medicine

## 2013-06-18 ENCOUNTER — Encounter (HOSPITAL_COMMUNITY): Payer: Self-pay

## 2013-06-18 ENCOUNTER — Telehealth: Payer: Self-pay | Admitting: *Deleted

## 2013-06-18 ENCOUNTER — Ambulatory Visit (INDEPENDENT_AMBULATORY_CARE_PROVIDER_SITE_OTHER): Payer: BC Managed Care – PPO | Admitting: Emergency Medicine

## 2013-06-18 ENCOUNTER — Telehealth: Payer: Self-pay | Admitting: Radiology

## 2013-06-18 ENCOUNTER — Encounter (HOSPITAL_COMMUNITY)
Admission: RE | Admit: 2013-06-18 | Discharge: 2013-06-18 | Disposition: A | Discharge status: Home / Self Care | Payer: BC Managed Care – PPO | Source: Ambulatory Visit | Attending: Gynecology | Admitting: Gynecology

## 2013-06-18 VITALS — BP 112/70 | HR 88 | Temp 98.7°F | Resp 18 | Ht 63.5 in | Wt 167.0 lb

## 2013-06-18 DIAGNOSIS — IMO0001 Reserved for inherently not codable concepts without codable children: Secondary | ICD-10-CM

## 2013-06-18 DIAGNOSIS — Z01818 Encounter for other preprocedural examination: Secondary | ICD-10-CM | POA: Insufficient documentation

## 2013-06-18 DIAGNOSIS — Z01812 Encounter for preprocedural laboratory examination: Secondary | ICD-10-CM | POA: Insufficient documentation

## 2013-06-18 HISTORY — DX: Anemia, unspecified: D64.9

## 2013-06-18 LAB — BASIC METABOLIC PANEL
BUN: 17 mg/dL (ref 6–23)
Calcium: 10.3 mg/dL (ref 8.4–10.5)
Creatinine, Ser: 0.42 mg/dL — ABNORMAL LOW (ref 0.50–1.10)
GFR calc Af Amer: >90 mL/min (ref >90)
GFR calc non Af Amer: >90 mL/min (ref >90)
Glucose, Bld: 416 mg/dL — ABNORMAL HIGH (ref 70–99)

## 2013-06-18 LAB — CBC
HCT: 37.4 % (ref 36.0–46.0)
Hemoglobin: 12.6 g/dL (ref 12.0–15.0)
MCH: 25.4 pg — ABNORMAL LOW (ref 26.0–34.0)
MCHC: 33.7 g/dL (ref 30.0–36.0)
MCV: 75.3 fL — ABNORMAL LOW (ref 78.0–100.0)
RDW: 13.1 % (ref 11.5–15.5)

## 2013-06-18 MED ORDER — LISINOPRIL 10 MG PO TABS: 10.0000 mg | ORAL_TABLET | Freq: Every day | ORAL | Status: DC

## 2013-06-18 NOTE — Telephone Encounter (Signed)
Call to Dr McPherson's office/ [facility]/ and advised of patient's blood sugar today to request appointment for patient.  Per Amy, patient to come to office now.  Call to patient's son, Pi, and notified of PCP instruction. He is agreeable to take patient to office now.

## 2013-06-18 NOTE — Pre-Procedure Instructions (Signed)
Called Shelley Escobar at [phone] to cancel interpreter for 06/26/12 surgery since patient's son will be used. Release form on chart.  Form reviewed by [employer] with patient and her son Shelley Escobar.

## 2013-06-18 NOTE — Telephone Encounter (Signed)
Surgery orders include what is to be in the consent form.  MD doing procedure should do this.  Dr. Farrel Gobble can do this on Monday, 06/24/12, when she returns.    Will CC to Dr. Farrel Gobble.

## 2013-06-18 NOTE — Progress Notes (Signed)
[facility] [address] [place] [address] [phone]  Date:  06/18/2013   Name:  Shelley Escobar   DOB:  1970-08-04   MRN:  [id number]  PCP:  Dow Adolph, MD    Chief Complaint: dm check   History of Present Illness:  Shelley Escobar is a 42 y.o. very pleasant female patient who presents with the following:  Scheduled for hysterectomy next week and went to hospital and found to have elevated BS on preadmission testing.  Is pending a change in her medication from metformin to Janumet until she runs out of metformin that she is taking.  Stopped lantus a year ago due to cost.  Has increased thirst, nocturia and polyuria.  Not very compliant with diet but son says is increasing her vegetables and proteins.  Cultural issues.    Patient Active Problem List   Diagnosis Date Noted  . Obesity (BMI 30.0-34.9) 01/27/2012  . DM (diabetes mellitus), type 2 07/02/2011  . Dyslipidemia 07/02/2011    Past Medical History  Diagnosis Date  . Hyperlipidemia   . DM (diabetes mellitus)   . Vitamin D deficiency   . SVD (spontaneous vaginal delivery)     x 3  . Anemia     hx    Past Surgical History  Procedure Laterality Date  . Tubal ligation      Age 54    History  Substance Use Topics  . Smoking status: Never Smoker   . Smokeless tobacco: Never Used  . Alcohol Use: No    History reviewed. No pertinent family history.  No Known Allergies  Medication list has been reviewed and updated.  Current Outpatient Prescriptions on File Prior to Visit  Medication Sig Dispense Refill  . gemfibrozil (LOPID) 600 MG tablet Take 1 tablet (600 mg total) by mouth 2 (two) times daily.  60 tablet  5  . glimepiride (AMARYL) 2 MG tablet TAKE ONE TABLET BY MOUTH TWICE DAILY BEFORE MEALS  60 tablet  5  . megestrol (MEGACE) 40 MG tablet twice a day by mouth  60 tablet  1  . metFORMIN (GLUCOPHAGE) 500 MG tablet Take by mouth 2 (two) times daily with a meal.      . Fe  Fum-FePoly-FA-Vit C-Vit B3 (INTEGRA F) 125-1 MG CAPS Take 1 capsule by mouth daily.  30 capsule  5  . sitaGLIPtin-metformin (JANUMET) 50-1000 MG per tablet Take 1 tablet by mouth 2 (two) times daily with a meal.  60 tablet  5  . triamcinolone ointment (KENALOG) 0.5 % Apply 1 application topically 2 (two) times daily.  15 g  0   No current facility-administered medications on file prior to visit.    Review of Systems:  As per HPI, otherwise negative.    Physical Examination: Filed Vitals:   06/18/13 1729  BP: 112/70  Pulse: 88  Temp: 98.7 F (37.1 C)  Resp: 18   Filed Vitals:   06/18/13 1729  Height: 5' 3.5" (1.613 m)  Weight: 167 lb (75.751 kg)   Body mass index is 29.12 kg/(m^2). Ideal Body Weight: Weight in (lb) to have BMI = 25: 143.1  GEN: WDWN, NAD, Non-toxic, A & O x 3 HEENT: Atraumatic, Normocephalic. Neck supple. No masses, No LAD. Ears and Nose: No external deformity. CV: RRR, No M/G/R. No JVD. No thrill. No extra heart sounds. PULM: CTA B, no wheezes, crackles, rhonchi. No retractions. No resp. distress. No accessory muscle use. ABD: S, NT, ND, +BS.  No rebound. No HSM. EXTR: No c/c/e NEURO Normal gait.  PSYCH: Normally interactive. Conversant. Not depressed or anxious appearing.  Calm demeanor.    Assessment and Plan: Out of control NIDDM Stop metformin and start janumet Increase  amaryl to 2 tabs BID Add lisinopril to protect kidneys See Dr Audria Nine Monday  Signed,  Phillips Odor, MD

## 2013-06-18 NOTE — Telephone Encounter (Signed)
Message copied by Alisa Graff on Tue Jun 18, 2013  4:18 PM ------      Message from: Douglass Rivers      Created: Tue Jun 18, 2013  1:08 PM       Pt is diabetic, clearly not well controlled, can we contact her pcp and have her appt moved up? By epic looks like it is in February ------

## 2013-06-18 NOTE — Telephone Encounter (Signed)
Call from [place] at [facility] PAT. Patient's blood glucose today was 416 and per Dr Brayton Caves, surgery canceled and patient needs endocrinology referral.  Will need letter for surgical clearance. They will call patient and notify her son who translates for patient.  Okey Regal states patient signed forms at hospital refusing translator.

## 2013-06-18 NOTE — Patient Instructions (Signed)
STOP METFORMIN START JANUMET INCREASE GLIMIPERIDE to 2 twice daily Check and record fasting blood sugar daily.   See Dr Audria Nine Monday    ?i Tho ???ng Tp 2, Ng??i L?n (Type 2 Diabetes Mellitus, Adult) ?i tho ???ng tp 2, th??ng g?i ??n gi?n l b?nh ti?u ???ng tp 2, l m?t b?nh ko di (m?n tnh). Trong b?nh ti?u ???ng tp 2, tuy?n t?y khng s?n xu?t ?? insulin (hocmon), cc t? bo t ?p ?ng v?i insulin ???c t?o ra (khng insulin) ho?c c? hai. Thng th??ng, insulin v?n chuy?n ???ng t? th?c ?n vo cc t? bo m. Cc t? bo m s? d?ng ???ng cho n?ng l??ng. Thi?u h?t insulin ho?c khng ?p ?ng bnh th??ng v?i insulin gy ra ???ng d? th?a tch t? trong mu thay v ?i vo cc t? bo m. K?t qu? l, l??ng ???ng trong mu cao ( t?ng ???ng huy?t) pht tri?n. ?nh h??ng c?a hm l??ng ???ng (glucoza) cao c th? gy ra nhi?u bi?n ch?ng. B?nh ti?u ???ng tp 2 tr??c ?y cn ???c g?i l b?nh ti?u ???ng kh?i pht ? ng??i l?n nh?ng n c th? x?y ra ? m?i l?a tu?i. CC Y?U T? NGUY C? M?t ng??i d? pht tri?n b?nh ti?u ???ng tp 2 n?u c ai ? trong gia ?nh b? b?nh ny ??ng th?i c m?t ho?c nhi?u y?u t? nguy c? chnh sau ?y:  Th?a cn.  L?i s?ng t ho?t ??ng.  Ti?n s? lin t?c ?n th?c ?n giu n?ng l??ng. Duy tr cn n?ng bnh th??ng v ho?t ??ng th? ch?t th??ng xuyn c th? lm gi?m nguy c? pht tri?n b?nh ti?u ???ng tp 2. TRI?U CH?NG M?t ng??i b? b?nh ti?u ???ng tp 2 c th? khng c cc tri?u ch?ng ban ??u. Cc tri?u ch?ng c?a b?nh ti?u ???ng tp 2 xu?t hi?n t? t?. Cc tri?u ch?ng bao g?m:  Gia t?ng kht n??c (ch?ng kht nhi?u).  Gia t?ng ti?u ti?n (?a ni?u).  T?ng ?i ti?u vo ban ?m (ti?u ?m).  Gi?m cn. Hi?n t??ng gi?m cn ny c th? r?t nhanh.  Th??ng xuyn b? nhi?m trng ti pht.  M?t m?i.  Y?u.  Thay ??i th? l?c, nh? nhn m?.  Mi tri cy trong h?i th? c?a b?n.  ?au b?ng.  Bu?n nn ho?c nn m?a.  V?t c?t ho?c v?t b?m tm lu lnh.  ?au bu?t ho?c t ? bn tay ho?c  bn chn. CH?N ?ON B?nh ti?u ???ng tp 2 th??ng khng ???c ch?n ?on cho ??n khi xu?t hi?n bi?n ch?ng c?a b?nh ti?u ???ng. B?nh ti?u ???ng tp 2 ???c ch?n ?on khi xu?t hi?n cc tri?u ch?ng c?ng nh? bi?n ch?ng v khi l??ng ???ng huy?t t?ng. L??ng ???ng huy?t c th? ???c ki?m tra b?ng m?t ho?c nhi?u xt nghi?m mu sau ?y:  Xt nghi?m ???ng huy?t nh?n ?n. B?n s? khng ???c php ?n trong t nh?t 8 ti?ng tr??c khi l?y m?u mu.  Xt nghi?m ???ng huy?t ng?u nhin. ???ng huy?t ???c xt nghi?m b?t k? lc no trong ngy, b?t k? b?n ?n lc no.  Xt nghi?m ???ng huy?t A1c huy?t s?c t? t?. Xt nghi?m A1c huy?t c?u t? cung c?p thng tin v? ki?m sot ???ng huy?t trong 3 thng tr??c ?.  Xt nghi?m dung n?p glucoza theo ???ng u?ng (OGTT). ???ng huy?t c?a b?n ???c ?o sau khi b?n ch?a ?n (?n chay) trong 2 gi? v sau ? sau khi b?n u?ng ?? u?ng c ch?a glucoza. ?I?U TR?  B?n  c th? c?n dng insulin ho?c thu?c tr? ti?u ???ng hng ngy ?? gi? cho l??ng ???ng huy?t trong ph?m vi mong mu?n.  B?n s? c?n s? d?ng li?u insulin ph h?p v?i vi?c t?p th? d?c v l?a ch?n th?c ph?m c l?i cho s?c kh?e. M?c tiu ?i?u tr? l ?? duy tr l??ng ???ng trong mu tr??c b?a ?n (glucoza tr??c b?a ?n) ? m?c 70-130 mg/dL. H??NG D?N CH?M [place] T?I NH  L??ng A1c huy?t s?c t? c?a b?n ???c ki?m tra hai l?n m?t n?m.  Th?c hi?n theo di ???ng huy?t hng ngy theo ch? d?n c?a chuyn gia ch?m [place] s?c kh?e.  Theo di [place] n??c ti?u khi b?n b? b?nh v theo ch? d?n c?a chuyn gia ch?m [place] s?c kh?e.  S? d?ng thu?c tr? ti?u ???ng ho?c insulin theo ch? d?n c?a chuyn gia ch?m  s?c kh?e ?? duy tr l??ng ???ng huy?t trong ph?m vi mong mu?n.  Khng bao gi? ?? h?t thu?c tr? ti?u ???ng ho?c insulin. Insulin c?n hng ngy.  ?i?u ch?nh insulin d?a vo m?c ?? s? d?ng hy?rat-cacbon c?a b?n. Hy?rat-cacbon c th? lm t?ng l??ng ???ng huy?t nh?ng c?n ph?i ???c bao g?m trong ch? ?? ?n u?ng c?a b?n. Hy?rat-cacbon cung c?p vitamin, khong ch?t v ch?t  x?, l m?t ph?n thi?t y?u c?a ch? ?? ?n u?ng lnh m?nh. Hy?rat-cacbon ???c tm th?y trong tri cy, rau, ng? c?c, cc s?n ph?m t? s?a, cc lo?i ??u v cc lo?i th?c ph?m c ch?a thm ???ng.    ?n th?c ph?m c l?i cho s?c kh?e. Xen k? 3 b?a ?n v?i 3 mn ?n nh?.  Gi?m cn n?u th?a cn.  Mang theo th? c?nh bo y t? ho?c ?eo ?? trang s?c c?nh bo y t?.  Mang theo ?? ?n nh? ch?a 15 gam hy?rat-cacbon cng b?n m?i lc ?? ?i?u tr? h? ???ng huy?t (h? ???ng huy?t). M?t s? v d? v? ?? ?n nh? ch?a 15 gam hy?rat-cacbon bao g?m:  Vin nn glucoza, 3 ho?c 4   Gel glucoza, ?ng 15 gam  Nho kh, 2 mu?ng (24 gam)  Th?ch hnh h?t ??u, 6  Bnh quy hnh con gi?ng, 8  N??c u?ng c b?t thng th??ng, 4 aox? (120 ml)  K?o chp chp, 9  Pht hi?n h? ???ng huy?t. H? ???ng huy?t x?y ra v?i l??ng ???ng huy?t t? 70 mg/dL tr? xu?ng. Nguy c? h? ???ng huy?t gia t?ng khi nh?n ?n ho?c b? b?a, trong v sau khi t?p th? d?c c??ng ?? cao v trong khi ng?. Cc tri?u ch?ng h? ???ng huy?t c th? bao g?m:  Run ho?c l?c.  Gi?m kh? n?ng t?p trung.  ?? m? hi.  Nh?p tim t?ng.  ?au ??u.  Kh mi?ng.  ?i.  Tnh d? kch thch.  Lo u.  Ng? b?n ch?n.  Thay ??i l?i ni ho?c s? ph?i h?p.  B? l l?n.  ?i?u tr? h? ???ng huy?t k?p th?i. N?u b?n t?nh to v c th? nu?t m?t cch an ton, hy theo quy t?c 15:15:  Dng 15-20 gam glucoza ho?c hy?rat-cacbon tc ??ng nhanh. L?a ch?n tc ??ng nhanh bao g?m gel glucoza, vin nn glucoza ho?c 4 aox? (120 ml) n??c p tri cy, soda bnh th??ng ho?c s?a t bo.  Ki?m tra l??ng ???ng huy?t c?a b?n 15 pht sau khi u?ng glucoza.  Dng t? 15-20 gam glucoza tr? ln n?u l??ng ???ng huy?t ???c ?o l?i v?n ? m?c 70 mg/dL tr? xu?ng.  ?  n m?t b?a ?n bnh th??ng ho?c ?? ?n nh? trong vng 1 gi? sau khi l??ng ???ng huy?t tr? l?i bnh th??ng.    Hy ??  ch?ng ?a ni?u v ch?ng kht nhi?u, ? l nh?ng d?u hi?u s?m t?ng ???ng huy?t. Vi?c pht hi?n t?ng ???ng huy?t s?m cho php ?i?u tr?  k?p th?i. ?i?u tr? t?ng ???ng huy?t theo ch? d?n c?a chuyn gia ch?m [place] s?c kh?e.  M?i tu?n tham gia vo t nh?t 150 pht ho?t ??ng th? ch?t c??ng ?? trung bnh, tr?i r?ng trn t nh?t 3 ngy trong tu?n ho?c theo ch? d?n c?a chuyn gia ch?m scs?c kh?e. Ngoi ra, b?n nn tham gia vo bi t?p s?c ?? khng t nh?t 2 l?n m?t tu?n ho?c theo ch? d?n c?a chuyn gia ch?m [place] s?c kh?e.  ?i?u ch?nh thu?c v l??ng th?c ?n khi c?n n?u b?n b?t ??u bi t?p ho?c mn th? thao m?i.  Theo k? ho?ch ngy b?nh c?a b?n b?t c? lc no b?n khng th? ?n ho?c u?ng bnh th??ng.  Trnh s? d?ng thu?c l.  Gi?i h?n l??ng r??u b?n u?ng khng qu 1 ly m?i ngy v?i ph? n? khng mang thai v 2 ly m?i ngy v?i nam gi?i. B?n ch? nn u?ng r??u km theo ?n. Ni chuy?n v?i chuyn gia ch?m [place] s?c kh?e xem u?ng r??u c an ton cho b?n khng. Cho chuyn gia ch?m [place] s?c kh?e bi?t n?u b?n u?ng r??u vi l?n m?t tu?n.  G?p chuyn gia ch?m [place] s?c kh?e ?? khm l?i th??ng xuyn.  S?p x?p bu?i khm m?t ngay sau khi ch?n ?on b?nh ti?u ???ng tp 2 v sau ? hng n?m.  Th?c hi?n ch?m [place] da v bn chn hng ngy. Ki?m tra da v bn chn hng ngy xem c v?t c?t, v?t b?m tm, t?y ??, v?n ?? v? mng tay, ch?y mu, m?n n??c hay l? lot khng. Bn chn c?n ???c khm b?i chuyn gia ch?m [place] s?c kh?e hng n?m.  ?nh r?ng v n??u t nh?t hai l?n m?t ngy v dng ch? nha khoa t nh?t m?t l?n m?t ngy. G?p nha s? ?? khm l?i th??ng xuyn.  Chia s? k? ho?ch qu?n l b?nh ti?u ???ng c?a b?n v?i n?i lm vi?c ho?c tr??ng h?c c?a b?n.  Gi? c?p nh?t v?i vi?c ch?ng ng?a.  H?c qu?n l c?ng th?ng.  Tm ki?m gio d?c v h? tr? v? b?nh ti?u ???ng th??ng xuyn khi c?n.  Tham gia ho?c tm cch ph?c h?i ch?c n?ng khi c?n thi?t ?? duy tr ho?c c?i thi?n tnh ??c l?p v ch?t l??ng cu?c s?ng. Yu c?u gi?i thi?u v?t l tr? li?u ho?c li?u php ngh? nghi?p n?u b?n b? t bn chn ho?c t tay ho?c nh?ng kh kh?n v?i vi?c ch?i ??u, m?c qu?n o, ?n u?ng ho?c ho?t ??ng  th? ch?t. HY ?I KHM N?U:  B?n khng th? ?n th?c ?n ho?c u?ng ?? u?ng trong h?n 6 gi?.  B?n b? bu?n nn v nn m?a trong h?n 6 gi?.  L??ng ???ng huy?t c?a b?n cao trn 240 mg/dL.  C s? thay ??i tr?ng thi tinh th?n.  B?n b? thm m?t c?n b?nh nghim tr?ng.  B?n b? tiu ch?y trong h?n 6 gi?.  B?n ? b? m?t ho?c b? s?t trong m?t vi ngy v khng kh h?n.  B?n b? ?au trong khi tham gia b?t k? ho?t ??ng th? ch?t no. HY NGAY L?P T?C ?I KHM N?U:  B?n b? kh th?.  B?n c l??ng [place] ? m?c trung bnh ??n cao. ??M B?O B?N:  Hi?u cc h??ng d?n ny.  S? theo di tnh tr?ng c?a mnh.  S? yu c?u tr? gip ngay l?p t?c n?u b?n c?m th?y khng ?? ho?c tnh tr?ng tr?m tr?ng h?n. Document Released: 06/06/2005 Document Revised: 02/06/2013 [facility] Patient Information 2014 [place], [place].

## 2013-06-18 NOTE — Telephone Encounter (Signed)
She has elevated blood sugar reading. Could not have Gyn surgery today due to this, advised she should come in now, to urgent care.

## 2013-06-18 NOTE — Pre-Procedure Instructions (Addendum)
Patient signed release of responsibility for interpretation and was explained by interpreter Ms. Snow.  Patient's son Stanton Kidney is the translator.

## 2013-06-18 NOTE — Telephone Encounter (Signed)
Patient for PAT appointment and need surgery orders. Surgery on 06-26-13.  Advised Dr lathrop out this week and will send to Dr Hyacinth Meeker.

## 2013-06-18 NOTE — Pre-Procedure Instructions (Signed)
Dr Brayton Caves informed of patient's elevated blood glucose 416 at today's PAT appt.  Telephone order that patient needs to see endocrinologist and get sugar levels under control before having surgery.  Kennon Rounds at Dr Lathrop's office informed and will follow up with patient.

## 2013-06-18 NOTE — Patient Instructions (Addendum)
   Your procedure is scheduled on: Wednesday, Jan 7  Enter through the [employer] [facility] at:  11AM Pick up the phone at the desk and dial [phone] and inform us of your arrival.  Please call this number if you have any problems the morning of surgery: [phone]  Remember: Do not eat food after midnight: Tuesday Do not drink clear liquids after: 830 AM Wednesday, day of surgery Take these medicines the morning of surgery with a SIP OF WATER:  Gemfibrozil.  Patient instructed to take Tuesday am dose of metformin but withhold Tuesday pm and Wednesday am - day of surgery dose.  Patient instructed to take glimepide on Tuesday but withhold Wednesday-day of surgery am dose.   Do not wear jewelry, make-up, or FINGER nail polish No metal in your hair or on your body. Do not wear lotions, powders, perfumes. You may wear deodorant.   Do not bring valuables to the hospital. Contacts, dentures or bridgework may not be worn into surgery.  Patients discharged on the day of surgery will not be allowed to drive home.  Home with son Stanton Kidney cell [phone].

## 2013-06-21 NOTE — Telephone Encounter (Signed)
Central scheduling notified to cancel surgery per anesthesia.  Routing to provider for final review. Patient agreeable to disposition. Will close encounter

## 2013-06-22 ENCOUNTER — Ambulatory Visit (INDEPENDENT_AMBULATORY_CARE_PROVIDER_SITE_OTHER): Payer: BC Managed Care – PPO | Admitting: Family Medicine

## 2013-06-22 VITALS — BP 100/60 | HR 87 | Temp 99.1°F | Resp 16 | Ht 64.0 in | Wt 167.0 lb

## 2013-06-22 DIAGNOSIS — E118 Type 2 diabetes mellitus with unspecified complications: Principal | ICD-10-CM

## 2013-06-22 DIAGNOSIS — IMO0002 Reserved for concepts with insufficient information to code with codable children: Secondary | ICD-10-CM

## 2013-06-22 DIAGNOSIS — E1165 Type 2 diabetes mellitus with hyperglycemia: Secondary | ICD-10-CM

## 2013-06-22 LAB — GLUCOSE, POCT (MANUAL RESULT ENTRY): POC Glucose: 383 mg/dl — AB (ref 70–99)

## 2013-06-22 NOTE — Progress Notes (Signed)
[facility] [address], [place] [address] [phone]  Date:  06/22/2013   Name:  Shelley Escobar   DOB:  1970/11/03   MRN:  [id number]  PCP:  Ellsworth Lennox, MD    Chief Complaint: Follow-up   History of Present Illness:  Shelley Escobar is a 43 y.o. very pleasant female patient who presents with the following:  History of DM diagnosed a few months ago when she went for pre- op for a hysterectomy.  At her last visit we changed from metformin to janument and increased her Amaryl.  She was supposed to see Dr. Leward Quan on Monday-  However she is here today just for a recheck.  Her son is helping her and states that her surgeon suggested she come in to be seen as they are trying to schedule her operation They have been checking her AM glucose for the last few days.  It seems to be trending down: Her sugar has been 360, 320, and then 290 today.    Her most recent A1c was 11.9.   She is working on her diet and exercise some.  Hysterectomy is going to be done by Dr. Charlies Constable.  However they are not sure of the exact date now because they need to get her DM under control first Patient Active Problem List   Diagnosis Date Noted  . Obesity (BMI 30.0-34.9) 01/27/2012  . DM (diabetes mellitus), type 2 07/02/2011  . Dyslipidemia 07/02/2011    Past Medical History  Diagnosis Date  . Hyperlipidemia   . DM (diabetes mellitus)   . Vitamin D deficiency   . SVD (spontaneous vaginal delivery)     x 3  . Anemia     hx    Past Surgical History  Procedure Laterality Date  . Tubal ligation      Age 23    History  Substance Use Topics  . Smoking status: Never Smoker   . Smokeless tobacco: Never Used  . Alcohol Use: No    History reviewed. No pertinent family history.  No Known Allergies  Medication list has been reviewed and updated.  Current Outpatient Prescriptions on File Prior to Visit  Medication Sig Dispense Refill  . gemfibrozil (LOPID) 600 MG tablet  Take 1 tablet (600 mg total) by mouth 2 (two) times daily.  60 tablet  5  . glimepiride (AMARYL) 2 MG tablet TAKE ONE TABLET BY MOUTH TWICE DAILY BEFORE MEALS  60 tablet  5  . megestrol (MEGACE) 40 MG tablet twice a day by mouth  60 tablet  1  . sitaGLIPtin-metformin (JANUMET) 50-1000 MG per tablet Take 1 tablet by mouth 2 (two) times daily with a meal.  60 tablet  5  . triamcinolone ointment (KENALOG) 0.5 % Apply 1 application topically 2 (two) times daily.  15 g  0  . Fe Fum-FePoly-FA-Vit C-Vit B3 (INTEGRA F) 125-1 MG CAPS Take 1 capsule by mouth daily.  30 capsule  5  . lisinopril (PRINIVIL,ZESTRIL) 10 MG tablet Take 1 tablet (10 mg total) by mouth daily.  90 tablet  3   No current facility-administered medications on file prior to visit.    Review of Systems:  As per HPI- otherwise negative.   Physical Examination: Filed Vitals:   06/22/13 1746  BP: 100/60  Pulse: 87  Temp: 99.1 F (37.3 C)  Resp: 16   Filed Vitals:   06/22/13 1746  Height: 5\' 4"  (1.626 m)  Weight: 167 lb (75.751  kg)   Body mass index is 28.65 kg/(m^2). Ideal Body Weight: Weight in (lb) to have BMI = 25: 145.3  GEN: WDWN, NAD, Non-toxic, A & O x 3, looks well, overweight.  Her son helps with translating today HEENT: Atraumatic, Normocephalic. Neck supple. No masses, No LAD. Ears and Nose: No external deformity. CV: RRR, No M/G/R. No JVD. No thrill. No extra heart sounds. PULM: CTA B, no wheezes, crackles, rhonchi. No retractions. No resp. distress. No accessory muscle use. EXTR: No c/c/e NEURO Normal gait.  PSYCH: Normally interactive. Conversant. Not depressed or anxious appearing.  Calm demeanor.   Results for orders placed in visit on 06/22/13  GLUCOSE, POCT (MANUAL RESULT ENTRY)      Result Value Range   POC Glucose 383 (*) 70 - 99 mg/dl    Assessment and Plan: Type II or unspecified type diabetes mellitus with unspecified complication, uncontrolled - Plan: POCT glucose (manual entry), Basic  metabolic panel  Diabetes- seems to be coming under better control. Asked her to continue checking FBG in the am.  If trending down into the low 200s/ or 100s over the next couple of weeks let's plan to recheck in about 2 months.  However if NOT trending down as above plan to follow-up sooner and consider insulin.  They will keep in touch with [place] regarding her progress   Signed Lamar Blinks, MD

## 2013-06-22 NOTE — Patient Instructions (Signed)
Your sugar is still fairly high, but it generally takes time to get diabetes under control.   I am not sure when your surgeon will feel comfortable doing your surgery- keep in touch with them.   Let's plan to recheck your A1c in about 2 months.

## 2013-06-23 LAB — BASIC METABOLIC PANEL
BUN: 18 mg/dL (ref 6–23)
CALCIUM: 10 mg/dL (ref 8.4–10.5)
CO2: 22 mEq/L (ref 19–32)
CREATININE: 0.55 mg/dL (ref 0.50–1.10)
Chloride: 101 mEq/L (ref 96–112)
GLUCOSE: 305 mg/dL — AB (ref 70–99)
POTASSIUM: 4.3 meq/L (ref 3.5–5.3)
Sodium: 134 mEq/L — ABNORMAL LOW (ref 135–145)

## 2013-06-24 ENCOUNTER — Encounter: Payer: Self-pay | Admitting: Family Medicine

## 2013-06-24 ENCOUNTER — Telehealth: Payer: Self-pay | Admitting: *Deleted

## 2013-06-24 NOTE — Telephone Encounter (Signed)
Patient's son calling to check on status of surgery for Wednesday.  Advised case was canceled by anesthesia due to patient's very high blood glucose level.  He states he took his mom to doctor as we instructed last week and they doubled her medication. He states her blood sugar was 375 on sat when they went back to doctor and today was 234.  They do not have to go back to doctor for 2 months.  They were told it would take a while before changes could be seen in A1C levels so what is next step. Advised dr lathrop will have to check with hospital and we will call them back.

## 2013-06-26 ENCOUNTER — Encounter (HOSPITAL_COMMUNITY): Admission: RE | Payer: Self-pay | Source: Ambulatory Visit

## 2013-06-26 ENCOUNTER — Ambulatory Visit (HOSPITAL_COMMUNITY): Admission: RE | Admit: 2013-06-26 | Payer: BC Managed Care – PPO | Source: Ambulatory Visit | Admitting: Gynecology

## 2013-06-26 SURGERY — DILATATION AND CURETTAGE /HYSTEROSCOPY
Anesthesia: Choice

## 2013-07-12 ENCOUNTER — Telehealth: Payer: Self-pay

## 2013-07-12 NOTE — Telephone Encounter (Signed)
Patients son calling to request refill on patients glucose pill says that the pharmacy told him to call us about this [phone]

## 2013-07-15 MED ORDER — SITAGLIPTIN PHOS-METFORMIN HCL 50-1000 MG PO TABS: 1.0000 | ORAL_TABLET | Freq: Two times a day (BID) | ORAL | Status: DC

## 2013-07-15 MED ORDER — GLIMEPIRIDE 2 MG PO TABS: ORAL_TABLET | ORAL | Status: DC

## 2013-07-15 NOTE — Telephone Encounter (Signed)
Dr Charlies Constable, this is the patient you wanted to follow-up with regarding when anesthesia would allow her to have surgery .

## 2013-07-15 NOTE — Telephone Encounter (Signed)
Pt is to rtc in 2 months to recheck diabetes. Sent in Rx refill. Pt advised.

## 2013-07-16 ENCOUNTER — Other Ambulatory Visit: Payer: Self-pay | Admitting: Gynecology

## 2013-07-16 NOTE — Telephone Encounter (Signed)
Message copied by Joselyn Arrow on Tue Jul 16, 2013  1:16 PM ------      Message from: Elveria Rising      Created: Tue Jul 16, 2013  9:00 AM       Please call son regarding his mother, would like them to come in for BMET so we can see if we can schedule surgery, would like ov to assess her bleeding and need for  Megace.      Thanks,      TL ------

## 2013-07-16 NOTE — Telephone Encounter (Signed)
Spoke with [name] Nguyen(son) and scheduled an appointment for his mother to come in for Consult/Lab 07/23/13

## 2013-07-23 ENCOUNTER — Ambulatory Visit (INDEPENDENT_AMBULATORY_CARE_PROVIDER_SITE_OTHER): Payer: BC Managed Care – PPO | Admitting: Gynecology

## 2013-07-23 VITALS — BP 110/84 | HR 72 | Resp 12 | Ht 64.0 in | Wt 168.0 lb

## 2013-07-23 DIAGNOSIS — N949 Unspecified condition associated with female genital organs and menstrual cycle: Secondary | ICD-10-CM

## 2013-07-23 DIAGNOSIS — N938 Other specified abnormal uterine and vaginal bleeding: Secondary | ICD-10-CM

## 2013-07-23 DIAGNOSIS — E119 Type 2 diabetes mellitus without complications: Secondary | ICD-10-CM

## 2013-07-23 LAB — GLUCOSE, POCT (MANUAL RESULT ENTRY): POC Glucose: 235 mg/dl — AB (ref 70–99)

## 2013-07-23 LAB — HEMOGLOBIN A1C
Hgb A1c MFr Bld: 12.3 % — ABNORMAL HIGH (ref <5.7)
Mean Plasma Glucose: 306 mg/dL — ABNORMAL HIGH (ref <117)

## 2013-07-23 MED ORDER — MEGESTROL ACETATE 40 MG PO TABS: ORAL_TABLET | ORAL | Status: DC

## 2013-07-23 NOTE — Progress Notes (Signed)
Subjective:     Patient ID: Shelley Escobar, female   DOB: 1970-08-18, 43 y.o.   MRN: [id number]  HPI Comments: Pt here with son to review labs and medications, she was scheduled for [facility] hysteroscopy for DUB but was canceled due to markedly elevated glucose.  Pt has a better understanding of her diabetes and seems to be in better control.  FS this am 235, but she has not eaten yet.   her fasting was 280-340, she only checks once a day, she was not asked to check after meals due to cost of test strips.   She has an exam with PCP at end of current month.  Pt had been on insulin in the past but was changed back to pills due to cost. Son states that mother does not bleed if she stays on megace twice a day.    Review of Systems Per HPI    Objective:   Physical Exam  Nursing note and vitals reviewed. Constitutional: She appears well-developed and well-nourished.  Neurological: She is alert.       Assessment:     DUB Poorly controlled diabetes     Plan:     discussed with son our concerns regarding her poorly controlled diabetes and her need for continued use of the megace.  Ideally her FBS should be lower, we had discussed options with anesthesia who suggested pre-op insulin drip to stabilize before OR, will readdress based on todays numbers HbA1c done today We will get back to them Refill on megace given  Length of time 95m, >50% face to face discussing DUB and surgical readiness

## 2013-07-30 ENCOUNTER — Telehealth: Payer: Self-pay | Admitting: Gynecology

## 2013-07-30 NOTE — Telephone Encounter (Signed)
Spoke with pt's son to go over surgical instructions and to make appts. He actually has a copy of surgical instructions already and will translate them for her. Scheduled pre-op appt for 08-05-13 at 5:30 pm with TL. Post-op appt scheduled for 08-27-13 at 11 am per his request.

## 2013-07-30 NOTE — Telephone Encounter (Signed)
Left message for [name] to call back to go over insurance information for his mothers surgery/ssf

## 2013-07-30 NOTE — Telephone Encounter (Signed)
Message copied by Laqueta Due on Tue Jul 30, 2013 10:50 AM ------      Message from: Jaymes Graff      Created: Mon Jul 29, 2013  6:56 PM      Regarding: SURGERY PRECERT       NEWLY SCHEDULED. She was scheduled in the fall but had to cancel due to her diabetes.  I have no idea about her old precert.  Needs hysteroscopy [address]384 precerted.  Does not speak english, speak to son Pi ------

## 2013-07-30 NOTE — Telephone Encounter (Signed)
Patients son returned call. He was advised of $563.27 patient liability for upcoming surgery. He will call back to make payment today.

## 2013-08-02 ENCOUNTER — Encounter (HOSPITAL_COMMUNITY): Payer: Self-pay | Admitting: Pharmacist

## 2013-08-05 ENCOUNTER — Encounter: Payer: Self-pay | Admitting: Gynecology

## 2013-08-05 ENCOUNTER — Ambulatory Visit (INDEPENDENT_AMBULATORY_CARE_PROVIDER_SITE_OTHER): Payer: BC Managed Care – PPO | Admitting: Gynecology

## 2013-08-05 VITALS — BP 127/81 | HR 90 | Resp 12 | Ht 64.0 in | Wt 168.0 lb

## 2013-08-05 DIAGNOSIS — N938 Other specified abnormal uterine and vaginal bleeding: Secondary | ICD-10-CM

## 2013-08-05 DIAGNOSIS — E119 Type 2 diabetes mellitus without complications: Secondary | ICD-10-CM

## 2013-08-05 DIAGNOSIS — N949 Unspecified condition associated with female genital organs and menstrual cycle: Secondary | ICD-10-CM

## 2013-08-05 NOTE — Pre-Procedure Instructions (Signed)
Dr. Royce Macadamia made aware that Shelley Escobar may need glucostabilizer to manage diabetes for surgery.

## 2013-08-05 NOTE — Pre-Procedure Instructions (Deleted)
Dr. Royce Macadamia made aware that this pt. may need glucostabilizer orderd.

## 2013-08-05 NOTE — Progress Notes (Signed)
  43 y.o.MarriedNot Hispanic or Latinofemale presents for preoperative consult for [facility] hysteroscopy with resectoscope for menorrhagia.  Pt was seen in [facility] clinic 01/2013 for menorrhagia and had an EMB that showed benign polypoid endometrium with progestational effect, she continues on megace 40mg  BID and will bleed if she skips dose.  Pt was scheduled for OR earlier this year but canceled due to high glucose of 383 fasting.  We did a recent HbA1c 12.3 ( 07/23/13) avg 306.  Pt is no longer on insulin due to cost.  Pt is eager for surgery.  Pertinent items are noted in HPI.  BP 127/81  Pulse 90  Resp 12  Ht 5\' 4"  (1.626 m)  Wt 168 lb (76.204 kg)  BMI 28.82 kg/m2 General appearance: alert, cooperative and appears stated age Lungs: clear to auscultation bilaterally Heart: regular rate and rhythm, S1, S2 normal, no murmur, click, rub or gallop Abdomen: normal findings: soft, non-tender Pelvic: deferred  To OR  The procedure was discussed at length.  Pt informed regarding risks and benefits of surgery, including but not limited to bleeding, infections, damage to bowel or bladder due to uterine perforation either during the procedure or during dilation.  A medicationwill not be be given preoperatively to soften the cervix.  A laparoscope will be placed in the event of perforation.  Risks of reaction to the distending media was reviewed.  Due to her poor glycemic control, we will have her arrive 5h early for glucomander control of her blood sugars.    There is a risk of formation of a deep vein thrombus in the extremities was discussed, although PAS will be placed to minimize the risk, the patient was informed that a pulmonary embolism could still form and could result in death.  She was instructed on signs and symptoms to be aware of and the need to call if they should develop.  Fluid overload from the distending media was discussed as was the usual intra-operative safety precautions in place.  All  questions were addressed.  Post-operative medications NSAID were given to the patient.

## 2013-08-06 NOTE — Addendum Note (Signed)
Addended by: Elveria Rising on: 08/06/2013 01:28 PM   Modules accepted: Orders

## 2013-08-07 ENCOUNTER — Encounter (HOSPITAL_COMMUNITY): Payer: BC Managed Care – PPO | Admitting: Anesthesiology

## 2013-08-07 ENCOUNTER — Ambulatory Visit (HOSPITAL_COMMUNITY)
Admission: RE | Admit: 2013-08-07 | Discharge: 2013-08-07 | Disposition: A | Discharge status: Home / Self Care | Payer: BC Managed Care – PPO | Source: Ambulatory Visit | Attending: Gynecology | Admitting: Gynecology

## 2013-08-07 ENCOUNTER — Encounter (HOSPITAL_COMMUNITY)
Admission: RE | Disposition: A | Discharge status: Home / Self Care | Payer: Self-pay | Source: Ambulatory Visit | Attending: Gynecology

## 2013-08-07 ENCOUNTER — Ambulatory Visit (HOSPITAL_COMMUNITY): Payer: BC Managed Care – PPO | Admitting: Anesthesiology

## 2013-08-07 ENCOUNTER — Encounter (HOSPITAL_COMMUNITY): Payer: Self-pay | Admitting: Anesthesiology

## 2013-08-07 DIAGNOSIS — N920 Excessive and frequent menstruation with regular cycle: Secondary | ICD-10-CM | POA: Insufficient documentation

## 2013-08-07 DIAGNOSIS — E119 Type 2 diabetes mellitus without complications: Secondary | ICD-10-CM | POA: Insufficient documentation

## 2013-08-07 DIAGNOSIS — N938 Other specified abnormal uterine and vaginal bleeding: Secondary | ICD-10-CM

## 2013-08-07 DIAGNOSIS — I10 Essential (primary) hypertension: Secondary | ICD-10-CM | POA: Insufficient documentation

## 2013-08-07 DIAGNOSIS — N949 Unspecified condition associated with female genital organs and menstrual cycle: Secondary | ICD-10-CM

## 2013-08-07 DIAGNOSIS — E669 Obesity, unspecified: Secondary | ICD-10-CM | POA: Insufficient documentation

## 2013-08-07 DIAGNOSIS — Z9889 Other specified postprocedural states: Secondary | ICD-10-CM

## 2013-08-07 DIAGNOSIS — E785 Hyperlipidemia, unspecified: Secondary | ICD-10-CM

## 2013-08-07 DIAGNOSIS — D649 Anemia, unspecified: Secondary | ICD-10-CM | POA: Insufficient documentation

## 2013-08-07 HISTORY — PX: HYSTEROSCOPY WITH D & C: SHX1775

## 2013-08-07 LAB — BASIC METABOLIC PANEL
BUN: 13 mg/dL (ref 6–23)
CALCIUM: 10.2 mg/dL (ref 8.4–10.5)
CHLORIDE: 98 meq/L (ref 96–112)
CO2: 25 mEq/L (ref 19–32)
CREATININE: 0.41 mg/dL — AB (ref 0.50–1.10)
GFR calc non Af Amer: >90 mL/min (ref >90)
Glucose, Bld: 283 mg/dL — ABNORMAL HIGH (ref 70–99)
Potassium: 4.4 mEq/L (ref 3.7–5.3)
Sodium: 136 mEq/L — ABNORMAL LOW (ref 137–147)

## 2013-08-07 LAB — GLUCOSE, CAPILLARY
GLUCOSE-CAPILLARY: 269 mg/dL — AB (ref 70–99)
GLUCOSE-CAPILLARY: 171 mg/dL — AB (ref 70–99)
Glucose-Capillary: 229 mg/dL — ABNORMAL HIGH (ref 70–99)
Glucose-Capillary: 198 mg/dL — ABNORMAL HIGH (ref 70–99)
Glucose-Capillary: 167 mg/dL — ABNORMAL HIGH (ref 70–99)
Glucose-Capillary: 166 mg/dL — ABNORMAL HIGH (ref 70–99)

## 2013-08-07 LAB — CBC
HEMATOCRIT: 39.5 % (ref 36.0–46.0)
Hemoglobin: 13.3 g/dL (ref 12.0–15.0)
MCH: 25.2 pg — ABNORMAL LOW (ref 26.0–34.0)
MCHC: 33.7 g/dL (ref 30.0–36.0)
MCV: 74.8 fL — AB (ref 78.0–100.0)
Platelets: 223 10*3/uL (ref 150–400)
RBC: 5.28 MIL/uL — ABNORMAL HIGH (ref 3.87–5.11)
RDW: 12.5 % (ref 11.5–15.5)
WBC: 8.7 10*3/uL (ref 4.0–10.5)

## 2013-08-07 LAB — GLUCOSE, RANDOM: Glucose, Bld: 413 mg/dL — ABNORMAL HIGH (ref 70–99)

## 2013-08-07 SURGERY — DILATATION AND CURETTAGE /HYSTEROSCOPY
Anesthesia: General | Site: Vagina

## 2013-08-07 MED ORDER — KETOROLAC TROMETHAMINE 30 MG/ML IJ SOLN
INTRAMUSCULAR | Status: DC | PRN
  Administered 2013-08-07: 30 mg via INTRAVENOUS

## 2013-08-07 MED ORDER — MIDAZOLAM HCL 2 MG/2ML IJ SOLN
INTRAMUSCULAR | Status: DC | PRN
  Administered 2013-08-07: 2 mg via INTRAVENOUS

## 2013-08-07 MED ORDER — KETOROLAC TROMETHAMINE 30 MG/ML IJ SOLN
INTRAMUSCULAR | Status: AC
  Filled 2013-08-07: qty 1

## 2013-08-07 MED ORDER — VASOPRESSIN 20 UNIT/ML IJ SOLN
INTRAMUSCULAR | Status: AC
  Filled 2013-08-07: qty 1

## 2013-08-07 MED ORDER — 0.9 % SODIUM CHLORIDE (POUR BTL) OPTIME
TOPICAL | Status: DC | PRN
  Administered 2013-08-07: 1000 mL

## 2013-08-07 MED ORDER — MIDAZOLAM HCL 2 MG/2ML IJ SOLN
INTRAMUSCULAR | Status: AC
  Filled 2013-08-07: qty 2

## 2013-08-07 MED ORDER — INSULIN REGULAR HUMAN 100 UNIT/ML IJ SOLN
6.0000 [IU] | Freq: Once | INTRAMUSCULAR | Status: DC
  Filled 2013-08-07: qty 0.06

## 2013-08-07 MED ORDER — SODIUM CHLORIDE 0.9 % IJ SOLN
INTRAMUSCULAR | Status: AC
  Filled 2013-08-07: qty 50

## 2013-08-07 MED ORDER — PROPOFOL 10 MG/ML IV EMUL
INTRAVENOUS | Status: AC
  Filled 2013-08-07: qty 20

## 2013-08-07 MED ORDER — LIDOCAINE HCL (CARDIAC) 20 MG/ML IV SOLN
INTRAVENOUS | Status: DC | PRN
  Administered 2013-08-07: 80 mg via INTRAVENOUS

## 2013-08-07 MED ORDER — LACTATED RINGERS IV SOLN
INTRAVENOUS | Status: DC
  Administered 2013-08-07 (×3): via INTRAVENOUS

## 2013-08-07 MED ORDER — LIDOCAINE HCL 2 % IJ SOLN
INTRAMUSCULAR | Status: AC
  Filled 2013-08-07: qty 20

## 2013-08-07 MED ORDER — METOCLOPRAMIDE HCL 5 MG/ML IJ SOLN
INTRAMUSCULAR | Status: AC
  Filled 2013-08-07: qty 2

## 2013-08-07 MED ORDER — FENTANYL CITRATE 0.05 MG/ML IJ SOLN
INTRAMUSCULAR | Status: AC
  Filled 2013-08-07: qty 2

## 2013-08-07 MED ORDER — INSULIN ASPART 100 UNIT/ML ~~LOC~~ SOLN
SUBCUTANEOUS | Status: AC
  Administered 2013-08-07: 6 [IU]
  Filled 2013-08-07: qty 1

## 2013-08-07 MED ORDER — BUPIVACAINE-EPINEPHRINE 0.25% -1:200000 IJ SOLN
INTRAMUSCULAR | Status: DC | PRN
  Administered 2013-08-07: 6.5 mL

## 2013-08-07 MED ORDER — ONDANSETRON HCL 4 MG/2ML IJ SOLN
INTRAMUSCULAR | Status: AC
  Filled 2013-08-07: qty 2

## 2013-08-07 MED ORDER — LIDOCAINE HCL (CARDIAC) 20 MG/ML IV SOLN
INTRAVENOUS | Status: AC
  Filled 2013-08-07: qty 5

## 2013-08-07 MED ORDER — BUPIVACAINE-EPINEPHRINE PF 0.25-1:200000 % IJ SOLN
INTRAMUSCULAR | Status: AC
  Filled 2013-08-07: qty 60

## 2013-08-07 MED ORDER — LIDOCAINE 1%/NA BICARB 0.1 MEQ INJECTION
INJECTION | INTRAVENOUS | Status: AC
  Filled 2013-08-07: qty 1

## 2013-08-07 MED ORDER — METOCLOPRAMIDE HCL 5 MG/ML IJ SOLN
10.0000 mg | Freq: Once | INTRAMUSCULAR | Status: AC | PRN
  Administered 2013-08-07: 10 mg via INTRAVENOUS

## 2013-08-07 MED ORDER — DEXAMETHASONE SODIUM PHOSPHATE 10 MG/ML IJ SOLN
INTRAMUSCULAR | Status: AC
  Filled 2013-08-07: qty 1

## 2013-08-07 MED ORDER — FENTANYL CITRATE 0.05 MG/ML IJ SOLN
INTRAMUSCULAR | Status: DC | PRN
  Administered 2013-08-07 (×3): 50 ug via INTRAVENOUS

## 2013-08-07 MED ORDER — FENTANYL CITRATE 0.05 MG/ML IJ SOLN: 25.0000 ug | INTRAMUSCULAR | Status: DC | PRN

## 2013-08-07 MED ORDER — LACTATED RINGERS IV SOLN
INTRAVENOUS | Status: DC
  Administered 2013-08-07: 09:00:00 via INTRAVENOUS

## 2013-08-07 MED ORDER — MEPERIDINE HCL 25 MG/ML IJ SOLN: 6.2500 mg | INTRAMUSCULAR | Status: DC | PRN

## 2013-08-07 MED ORDER — PROPOFOL 10 MG/ML IV BOLUS
INTRAVENOUS | Status: DC | PRN
  Administered 2013-08-07: 200 mg via INTRAVENOUS

## 2013-08-07 MED ORDER — LIDOCAINE HCL 2 % IJ SOLN
INTRAMUSCULAR | Status: DC | PRN
  Administered 2013-08-07: 6.5 mL

## 2013-08-07 MED ORDER — BUPIVACAINE-EPINEPHRINE PF 0.25-1:200000 % IJ SOLN
INTRAMUSCULAR | Status: AC
  Filled 2013-08-07: qty 30

## 2013-08-07 SURGICAL SUPPLY — 20 items
CATH ROBINSON RED A/P 16FR (CATHETERS) ×2 IMPLANT
CONTAINER PREFILL 10% NBF 60ML (FORM) ×4 IMPLANT
DRAPE HYSTEROSCOPY (DRAPE) ×2 IMPLANT
DRSG TELFA 3X8 NADH (GAUZE/BANDAGES/DRESSINGS) ×2 IMPLANT
ELECT REM PT RETURN 9FT ADLT (ELECTROSURGICAL) ×2
ELECTRODE REM PT RTRN 9FT ADLT (ELECTROSURGICAL) ×1 IMPLANT
GLOVE BIOGEL M 6.5 STRL (GLOVE) ×4 IMPLANT
GLOVE BIOGEL PI IND STRL 6.5 (GLOVE) ×1 IMPLANT
GLOVE BIOGEL PI INDICATOR 6.5 (GLOVE) ×1
GOWN STRL REUS W/TWL LRG LVL3 (GOWN DISPOSABLE) ×4 IMPLANT
LOOP ANGLED CUTTING 22FR (CUTTING LOOP) IMPLANT
NEEDLE HYPO 21X1.5 SAFETY (NEEDLE) IMPLANT
NEEDLE SPNL 22GX3.5 QUINCKE BK (NEEDLE) ×2 IMPLANT
PACK VAGINAL MINOR WOMEN LF (CUSTOM PROCEDURE TRAY) ×2 IMPLANT
PAD OB MATERNITY 4.3X12.25 (PERSONAL CARE ITEMS) ×2 IMPLANT
SET TUBING HYSTEROSCOPY 2 NDL (TUBING) IMPLANT
SYR CONTROL 10ML LL (SYRINGE) ×2 IMPLANT
TOWEL OR 17X24 6PK STRL BLUE (TOWEL DISPOSABLE) ×4 IMPLANT
TUBE HYSTEROSCOPY W Y-CONNECT (TUBING) IMPLANT
WATER STERILE IRR 1000ML POUR (IV SOLUTION) IMPLANT

## 2013-08-07 NOTE — Anesthesia Preprocedure Evaluation (Addendum)
Anesthesia Evaluation  Patient identified by MRN, date of birth, ID band Patient awake    Reviewed: Allergy & Precautions, H&P , NPO status , Patient's Chart, lab work & pertinent test results  Airway Mallampati: III TM Distance: >3 FB Neck ROM: Full    Dental no notable dental hx. (+) Teeth Intact   Pulmonary neg pulmonary ROS,  breath sounds clear to auscultation- rhonchi  Pulmonary exam normal       Cardiovascular hypertension, Pt. on medications Rhythm:Regular Rate:Normal     Neuro/Psych negative neurological ROS  negative psych ROS   GI/Hepatic negative GI ROS, Neg liver ROS,   Endo/Other  diabetes, Poorly Controlled, Type 2, Oral Hypoglycemic AgentsObesity Hyperlipidemia  Renal/GU negative Renal ROS  negative genitourinary   Musculoskeletal negative musculoskeletal ROS (+)   Abdominal (+) + obese,   Peds  Hematology  (+) anemia ,   Anesthesia Other Findings   Reproductive/Obstetrics menorrhagia                          Anesthesia Physical Anesthesia Plan  ASA: III  Anesthesia Plan: General   Post-op Pain Management:    Induction: Intravenous  Airway Management Planned: LMA  Additional Equipment:   Intra-op Plan:   Post-operative Plan: Extubation in OR  Informed Consent: I have reviewed the patients History and Physical, chart, labs and discussed the procedure including the risks, benefits and alternatives for the proposed anesthesia with the patient or authorized representative who has indicated his/her understanding and acceptance.   Dental advisory given  Plan Discussed with: CRNA, Anesthesiologist and Surgeon  Anesthesia Plan Comments:         Anesthesia Quick Evaluation

## 2013-08-07 NOTE — Discharge Instructions (Signed)
?i Tho ???ng Tp 2, Ng??i L?n (Type 2 Diabetes Mellitus, Adult) ?i tho ???ng tp 2, th??ng g?i ??n gi?n l b?nh ti?u ???ng tp 2, l m?t b?nh ko di (m?n tnh). Trong b?nh ti?u ???ng tp 2, tuy?n t?y khng s?n xu?t ?? insulin (hocmon), cc t? bo t ?p ?ng v?i insulin ???c t?o ra (khng insulin) ho?c c? hai. Thng th??ng, insulin v?n chuy?n ???ng t? th?c ?n vo cc t? bo m. Cc t? bo m s? d?ng ???ng cho n?ng l??ng. Thi?u h?t insulin ho?c khng ?p ?ng bnh th??ng v?i insulin gy ra ???ng d? th?a tch t? trong mu thay v ?i vo cc t? bo m. K?t qu? l, l??ng ???ng trong mu cao ( t?ng ???ng huy?t) pht tri?n. ?nh h??ng c?a hm l??ng ???ng (glucoza) cao c th? gy ra nhi?u bi?n ch?ng. B?nh ti?u ???ng tp 2 tr??c ?y cn ???c g?i l b?nh ti?u ???ng kh?i pht ? ng??i l?n nh?ng n c th? x?y ra ? m?i l?a tu?i. CC Y?U T? NGUY C? M?t ng??i d? pht tri?n b?nh ti?u ???ng tp 2 n?u c ai ? trong gia ?nh b? b?nh ny ??ng th?i c m?t ho?c nhi?u y?u t? nguy c? chnh sau ?y:  Th?a cn.  L?i s?ng t ho?t ??ng.  Ti?n s? lin t?c ?n th?c ?n giu n?ng l??ng. Duy tr cn n?ng bnh th??ng v ho?t ??ng th? ch?t th??ng xuyn c th? lm gi?m nguy c? pht tri?n b?nh ti?u ???ng tp 2. TRI?U CH?NG M?t ng??i b? b?nh ti?u ???ng tp 2 c th? khng c cc tri?u ch?ng ban ??u. Cc tri?u ch?ng c?a b?nh ti?u ???ng tp 2 xu?t hi?n t? t?. Cc tri?u ch?ng bao g?m:  Gia t?ng kht n??c (ch?ng kht nhi?u).  Gia t?ng ti?u ti?n (?a ni?u).  T?ng ?i ti?u vo ban ?m (ti?u ?m).  Gi?m cn. Hi?n t??ng gi?m cn ny c th? r?t nhanh.  Th??ng xuyn b? nhi?m trng ti pht.  M?t m?i.  Y?u.  Thay ??i th? l?c, nh? nhn m?.  Mi tri cy trong h?i th? c?a b?n.  ?au b?ng.  Bu?n nn ho?c nn m?a.  V?t c?t ho?c v?t b?m tm lu lnh.  ?au bu?t ho?c t ? bn tay ho?c bn chn. CH?N ?ON B?nh ti?u ???ng tp 2 th??ng khng ???c ch?n ?on cho ??n khi xu?t hi?n bi?n ch?ng c?a b?nh ti?u ???ng. B?nh ti?u ???ng tp 2  ???c ch?n ?on khi xu?t hi?n cc tri?u ch?ng c?ng nh? bi?n ch?ng v khi l??ng ???ng huy?t t?ng. L??ng ???ng huy?t c th? ???c ki?m tra b?ng m?t ho?c nhi?u xt nghi?m mu sau ?y:  Xt nghi?m ???ng huy?t nh?n ?n. B?n s? khng ???c php ?n trong t nh?t 8 ti?ng tr??c khi l?y m?u mu.  Xt nghi?m ???ng huy?t ng?u nhin. ???ng huy?t ???c xt nghi?m b?t k? lc no trong ngy, b?t k? b?n ?n lc no.  Xt nghi?m ???ng huy?t A1c huy?t s?c t? t?. Xt nghi?m A1c huy?t c?u t? cung c?p thng tin v? ki?m sot ???ng huy?t trong 3 thng tr??c ?.  Xt nghi?m dung n?p glucoza theo ???ng u?ng (OGTT). ???ng huy?t c?a b?n ???c ?o sau khi b?n ch?a ?n (?n chay) trong 2 gi? v sau ? sau khi b?n u?ng ?? u?ng c ch?a glucoza. ?I?U TR?  B?n c th? c?n dng insulin ho?c thu?c tr? ti?u ???ng hng ngy ?? gi? cho l??ng ???ng huy?t trong ph?m vi mong mu?n.  B?n s?  c?n s? d?ng li?u insulin ph h?p v?i vi?c t?p th? d?c v l?a ch?n th?c ph?m c l?i cho s?c kh?e. M?c tiu ?i?u tr? l ?? duy tr l??ng ???ng trong mu tr??c b?a ?n (glucoza tr??c b?a ?n) ? m?c 70-130 mg/dL. H??NG D?N CH?M [place] T?I NH  L??ng A1c huy?t s?c t? c?a b?n ???c ki?m tra hai l?n m?t n?m.  Th?c hi?n theo di ???ng huy?t hng ngy theo ch? d?n c?a chuyn gia ch?m [place] s?c kh?e.  Theo di [place] n??c ti?u khi b?n b? b?nh v theo ch? d?n c?a chuyn gia ch?m [place] s?c kh?e.  S? d?ng thu?c tr? ti?u ???ng ho?c insulin theo ch? d?n c?a chuyn gia ch?m [place] s?c kh?e ?? duy tr l??ng ???ng huy?t trong ph?m vi mong mu?n.  Khng bao gi? ?? h?t thu?c tr? ti?u ???ng ho?c insulin. Insulin c?n hng ngy.  ?i?u ch?nh insulin d?a vo m?c ?? s? d?ng hy?rat-cacbon c?a b?n. Hy?rat-cacbon c th? lm t?ng l??ng ???ng huy?t nh?ng c?n ph?i ???c bao g?m trong ch? ?? ?n u?ng c?a b?n. Hy?rat-cacbon cung c?p vitamin, khong ch?t v ch?t x?, l m?t ph?n thi?t y?u c?a ch? ?? ?n u?ng lnh m?nh. Hy?rat-cacbon ???c tm th?y trong tri cy, rau, ng? c?c, cc s?n ph?m t? s?a, cc lo?i ??u  v cc lo?i th?c ph?m c ch?a thm ???ng.    ?n th?c ph?m c l?i cho s?c kh?e. Xen k? 3 b?a ?n v?i 3 mn ?n nh?.  Gi?m cn n?u th?a cn.  Mang theo th? c?nh bo y t? ho?c ?eo ?? trang s?c c?nh bo y t?.  Mang theo ?? ?n nh? ch?a 15 gam hy?rat-cacbon cng b?n m?i lc ?? ?i?u tr? h? ???ng huy?t (h? ???ng huy?t). M?t s? v d? v? ?? ?n nh? ch?a 15 gam hy?rat-cacbon bao g?m:  Vin nn glucoza, 3 ho?c 4   Gel glucoza, ?ng 15 gam  Nho kh, 2 mu?ng (24 gam)  Th?ch hnh h?t ??u, 6  Bnh quy hnh con gi?ng, 8  N??c u?ng c b?t thng th??ng, 4 aox? (120 ml)  K?o chp chp, 9  Pht hi?n h? ???ng huy?t. H? ???ng huy?t x?y ra v?i l??ng ???ng huy?t t? 70 mg/dL tr? xu?ng. Nguy c? h? ???ng huy?t gia t?ng khi nh?n ?n ho?c b? b?a, trong v sau khi t?p th? d?c c??ng ?? cao v trong khi ng?. Cc tri?u ch?ng h? ???ng huy?t c th? bao g?m:  Run ho?c l?c.  Gi?m kh? n?ng t?p trung.  ?? m? hi.  Nh?p tim t?ng.  ?au ??u.  Kh mi?ng.  ?i.  Tnh d? kch thch.  Lo u.  Ng? b?n ch?n.  Thay ??i l?i ni ho?c s? ph?i h?p.  B? l l?n.  ?i?u tr? h? ???ng huy?t k?p th?i. N?u b?n t?nh to v c th? nu?t m?t cch an ton, hy theo quy t?c 15:15:  Dng 15-20 gam glucoza ho?c hy?rat-cacbon tc ??ng nhanh. L?a ch?n tc ??ng nhanh bao g?m gel glucoza, vin nn glucoza ho?c 4 aox? (120 ml) n??c p tri cy, soda bnh th??ng ho?c s?a t bo.  Ki?m tra l??ng ???ng huy?t c?a b?n 15 pht sau khi u?ng glucoza.  Dng t? 15-20 gam glucoza tr? ln n?u l??ng ???ng huy?t ???c ?o l?i v?n ? m?c 70 mg/dL tr? xu?ng.  ?n m?t b?a ?n bnh th??ng ho?c ?? ?n nh? trong vng 1 gi? sau khi l??ng ???ng huy?t tr? l?i bnh th??ng.  Hy ??  ch?ng ?a ni?u v ch?ng kht nhi?u, ? l nh?ng d?u hi?u s?m t?ng ???ng huy?t. Vi?c pht hi?n t?ng ???ng huy?t s?m cho php ?i?u tr? k?p th?i. ?i?u tr? t?ng ???ng huy?t theo ch? d?n c?a chuyn gia ch?m [place] s?c kh?e.  M?i tu?n tham gia vo t nh?t 150 pht ho?t ??ng th? ch?t  c??ng ?? trung bnh, tr?i r?ng trn t nh?t 3 ngy trong tu?n ho?c theo ch? d?n c?a chuyn gia ch?m scs?c kh?e. Ngoi ra, b?n nn tham gia vo bi t?p s?c ?? khng t nh?t 2 l?n m?t tu?n ho?c theo ch? d?n c?a chuyn gia ch?m [place] s?c kh?e.  ?i?u ch?nh thu?c v l??ng th?c ?n khi c?n n?u b?n b?t ??u bi t?p ho?c mn th? thao m?i.  Theo k? ho?ch ngy b?nh c?a b?n b?t c? lc no b?n khng th? ?n ho?c u?ng bnh th??ng.  Trnh s? d?ng thu?c l.  Gi?i h?n l??ng r??u b?n u?ng khng qu 1 ly m?i ngy v?i ph? n? khng mang thai v 2 ly m?i ngy v?i nam gi?i. B?n ch? nn u?ng r??u km theo ?n. Ni chuy?n v?i chuyn gia ch?m [place] s?c kh?e xem u?ng r??u c an ton cho b?n khng. Cho chuyn gia ch?m [place] s?c kh?e bi?t n?u b?n u?ng r??u vi l?n m?t tu?n.  G?p chuyn gia ch?m [place] s?c kh?e ?? khm l?i th??ng xuyn.  S?p x?p bu?i khm m?t ngay sau khi ch?n ?on b?nh ti?u ???ng tp 2 v sau ? hng n?m.  Th?c hi?n ch?m [place] da v bn chn hng ngy. Ki?m tra da v bn chn hng ngy xem c v?t c?t, v?t b?m tm, t?y ??, v?n ?? v? mng tay, ch?y mu, m?n n??c hay l? lot khng. Bn chn c?n ???c khm b?i chuyn gia ch?m [place] s?c kh?e hng n?m.  ?nh r?ng v n??u t nh?t hai l?n m?t ngy v dng ch? nha khoa t nh?t m?t l?n m?t ngy. G?p nha s? ?? khm l?i th??ng xuyn.  Chia s? k? ho?ch qu?n l b?nh ti?u ???ng c?a b?n v?i n?i lm vi?c ho?c tr??ng h?c c?a b?n.  Gi? c?p nh?t v?i vi?c ch?ng ng?a.  H?c qu?n l c?ng th?ng.  Tm ki?m gio d?c v h? tr? v? b?nh ti?u ???ng th??ng xuyn khi c?n.  Tham gia ho?c tm cch ph?c h?i ch?c n?ng khi c?n thi?t ?? duy tr ho?c c?i thi?n tnh ??c l?p v ch?t l??ng cu?c s?ng. Yu c?u gi?i thi?u v?t l tr? li?u ho?c li?u php ngh? nghi?p n?u b?n b? t bn chn ho?c t tay ho?c nh?ng kh kh?n v?i vi?c ch?i ??u, m?c qu?n o, ?n u?ng ho?c ho?t ??ng th? ch?t. HY ?I KHM N?U:  B?n khng th? ?n th?c ?n ho?c u?ng ?? u?ng trong h?n 6 gi?.  B?n b? bu?n nn v nn m?a trong h?n 6  gi?.  L??ng ???ng huy?t c?a b?n cao trn 240 mg/dL.  C s? thay ??i tr?ng thi tinh th?n.  B?n b? thm m?t c?n b?nh nghim tr?ng.  B?n b? tiu ch?y trong h?n 6 gi?.  B?n ? b? m?t ho?c b? s?t trong m?t vi ngy v khng kh h?n.  B?n b? ?au trong khi tham gia b?t k? ho?t ??ng th? ch?t no. HY NGAY L?P T?C ?I KHM N?U:  B?n b? kh th?.  B?n c l??ng [place] ? m?c trung bnh ??n cao. ??M B?O B?N:  Hi?u cc h??ng d?n ny.  S? theo di tnh tr?ng c?a mnh.  S? yu c?u tr? gip ngay l?p t?c n?u b?n c?m th?y khng ?? ho?c tnh tr?ng tr?m tr?ng h?n. Document Released: 06/06/2005 Document Revised: 02/06/2013 [facility] Patient Information 2014 [place], [place].  Post Anesthesia Home Care Instructions  Activity: Get plenty of rest for the remainder of the day. A responsible adult should stay with you for 24 hours following the procedure.  For the next 24 hours, DO NOT: -Drive a car -Paediatric nurse -Drink alcoholic beverages -Take any medication unless instructed by your physician -Make any legal decisions or sign important papers.  Meals: Start with liquid foods such as gelatin or soup. Progress to regular foods as tolerated. Avoid greasy, spicy, heavy foods. If nausea and/or vomiting occur, drink only clear liquids until the nausea and/or vomiting subsides. Call your physician if vomiting continues.  Special Instructions/Symptoms: Your throat may feel dry or sore from the anesthesia or the breathing tube placed in your throat during surgery. If this causes discomfort, gargle with warm salt water. The discomfort should disappear within 24 hours.

## 2013-08-07 NOTE — Anesthesia Postprocedure Evaluation (Signed)
  Anesthesia Post-op Note  Patient: [name]  Procedure(s) Performed: Procedure(s): DILATATION AND CURETTAGE /HYSTEROSCOPY (N/A)  Patient Location: PACU  Anesthesia Type:General  Level of Consciousness: awake, alert  and oriented  Airway and Oxygen Therapy: Patient Spontanous Breathing  Post-op Pain: mild  Post-op Assessment: Post-op Vital signs reviewed, Patient's Cardiovascular Status Stable, Respiratory Function Stable, Patent Airway, No signs of Nausea or vomiting and Pain level controlled  Post-op Vital Signs: Reviewed and stable  Complications: No apparent anesthesia complications

## 2013-08-07 NOTE — Interval H&P Note (Signed)
History and Physical Interval Note:  08/07/2013 11:36 AM  [place]  has presented today for surgery, with the diagnosis of Dysfunctional Uterine Bleeding   The various methods of treatment have been discussed with the patient and family. After consideration of risks, benefits and other options for treatment, the patient has consented to  Procedure(s): DILATATION AND CURETTAGE /HYSTEROSCOPY (N/A) as a surgical intervention .  The patient's history has been reviewed, patient examined, no change in status, stable for surgery.  I have reviewed the patient's chart and labs.  Questions were answered to the patient's satisfaction.     Shelley Escobar H

## 2013-08-07 NOTE — H&P (View-Only) (Signed)
  43 y.o.MarriedNot Hispanic or Latinofemale presents for preoperative consult for D&C hysteroscopy with resectoscope for menorrhagia.  Pt was seen in [facility] 01/2013 for menorrhagia and had an EMB that showed benign polypoid endometrium with progestational effect, she continues on megace 40mg BID and will bleed if she skips dose.  Pt was scheduled for OR earlier this year but canceled due to high glucose of 383 fasting.  We did a recent HbA1c 12.3 ( 07/23/13) avg 306.  Pt is no longer on insulin due to cost.  Pt is eager for surgery.  Pertinent items are noted in HPI.  BP 127/81  Pulse 90  Resp 12  Ht 5' 4" (1.626 m)  Wt 168 lb (76.204 kg)  BMI 28.82 kg/m2 General appearance: alert, cooperative and appears stated age Lungs: clear to auscultation bilaterally Heart: regular rate and rhythm, S1, S2 normal, no murmur, click, rub or gallop Abdomen: normal findings: soft, non-tender Pelvic: deferred  To OR  The procedure was discussed at length.  Pt informed regarding risks and benefits of surgery, including but not limited to bleeding, infections, damage to bowel or bladder due to uterine perforation either during the procedure or during dilation.  A medicationwill not be be given preoperatively to soften the cervix.  A laparoscope will be placed in the event of perforation.  Risks of reaction to the distending media was reviewed.  Due to her poor glycemic control, we will have her arrive 5h early for glucomander control of her blood sugars.    There is a risk of formation of a deep vein thrombus in the extremities was discussed, although PAS will be placed to minimize the risk, the patient was informed that a pulmonary embolism could still form and could result in death.  She was instructed on signs and symptoms to be aware of and the need to call if they should develop.  Fluid overload from the distending media was discussed as was the usual intra-operative safety precautions in place.  All  questions were addressed.  Post-operative medications NSAID were given to the patient.   

## 2013-08-07 NOTE — Transfer of Care (Signed)
Immediate Anesthesia Transfer of Care Note  Patient: Shelley Escobar  Procedure(s) Performed: Procedure(s): DILATATION AND CURETTAGE /HYSTEROSCOPY (N/A)  Patient Location: PACU  Anesthesia Type:General  Level of Consciousness: awake, alert  and oriented  Airway & Oxygen Therapy: Patient Spontanous Breathing and Patient connected to nasal cannula oxygen  Post-op Assessment: Report given to PACU RN and Post -op Vital signs reviewed and stable  Post vital signs: Reviewed and stable  Complications: No apparent anesthesia complications

## 2013-08-07 NOTE — Brief Op Note (Signed)
08/07/2013  1:18 PM  PATIENT:  Shelley Escobar  43 y.o. female  PRE-OPERATIVE DIAGNOSIS:  Dysfunctional Uterine Bleeding   POST-OPERATIVE DIAGNOSIS:  Dysfunctional Uterine Bleeding   PROCEDURE:  Procedure(s): DILATATION AND CURETTAGE /HYSTEROSCOPY (N/A)  SURGEON:  Surgeon(s) and Role:    * Azalia Bilis, MD - Primary  PHYSICIAN ASSISTANT:   ASSISTANTS: none   ANESTHESIA:   general  EBL:  Total I/O In: 1700 [I.V.:1700] Out: -   BLOOD ADMINISTERED:none  DRAINS: none   LOCAL MEDICATIONS USED:  MARCAINE   , LIDOCAINE  and Amount: 13 ml  SPECIMEN:  Source of Specimen:  uterus  DISPOSITION OF SPECIMEN:  PATHOLOGY  COUNTS:  YES  TOURNIQUET:  * No tourniquets in log *  DICTATION: .Other Dictation: Dictation Number [id number]  PLAN OF CARE: Discharge to home after PACU  PATIENT DISPOSITION:  PACU - hemodynamically stable.   Delay start of Pharmacological VTE agent (>24hrs) due to surgical blood loss or risk of bleeding: not applicable

## 2013-08-08 ENCOUNTER — Encounter (HOSPITAL_COMMUNITY): Payer: Self-pay | Admitting: Gynecology

## 2013-08-08 NOTE — Op Note (Signed)
NAME:  [name], [name]                 ACCOUNT NO.:  [id number]  MEDICAL RECORD NO.:  [id number]  LOCATION:  [facility]                          FACILITY:  [facility]  PHYSICIAN:  Alver Sorrow. Charlies Constable, M.D. DATE OF BIRTH:  [birth year]  DATE OF PROCEDURE:  08/07/2013 DATE OF DISCHARGE:  08/07/2013                              OPERATIVE REPORT   PREOPERATIVE DIAGNOSIS:  Dysfunctional bleeding.  POSTOP DIAGNOSIS:  Dysfunctional bleeding.  PROCEDURE:  D and C, hysteroscopy.  SURGEON:  Alver Sorrow. Charlies Constable, M.D.  ANESTHESIA:  General.  IV FLUID:  1700 mL.  I and O deficit of glycine solution was 165.  INDICATIONS:  The patient is a 43 year old G3, P3, poorly controlled diabetic with dysfunctional bleeding and menorrhagia, who was treated at the clinic with Megace with 40 b.i.d. after an endometrial biopsy when the patient presents to the office for an annual examination.  The patient was unable to come off her Megace due to heavy bleeding and was scheduled then for evaluation surgery.  Her biopsy had shown benign polypoid endometrium.  FINDINGS:  Markedly thickened appearing endometrium.  There was polypoid appearing in nature.  The right tubal ostia was able to be visualized the left was unable.  The uterus sounded to 7.  The cervix is unremarkable.  PATHOLOGY:  Uterine contents.  DESCRIPTION OF PROCEDURE:  The patient was taken to operating room, placed in the dorsal lithotomy position.  After general anesthesia, prepped and draped in usual sterile fashion.  Bimanual exam was performed.  Sterile weighted speculum was placed in the vagina.  The cervix was visualized and stabilized with a single-tooth tenaculum.  The uterus sounded to 7.  The cervix was noted to be dilated beyond 79- [place].  A  2.9 hysteroscope was advanced through the cervix and into the cavity.  Visualization was somewhat distorted due to bleeding and a markedly thickened endometrium and sharp curettage was performed for minimal  tissue.  The cervix was then injected with equal parts of 2% lidocaine and 0.25% Marcaine with epi for a total of 13 mL.  The hysteroscope was then readvanced.  The fundus was approached.  The ostia on the right was able to visualize.  The endometrium was markedly thickened consistent with progesterone effect.  A sharp curettage was again performed.  There was no distinct fibroids that were palpable and the uterine cry was appreciated.  The polyp forceps were advanced through the cervix and into the cavity and with gentle opening and closing, a moderate amount of tissue was obtained.  Without approaching the walls.  This was done several times.  The hysteroscope was again readvanced and there still appeared to be lot of tissue anteriorly. Sharp curettage again was performed and less tissue was appreciated on the last pass.  Because of difficulties with clearing the cavity of blood, I elected to not use the resectoscope because visualization was an issue.  An adequate amount of tissue was obtained for pathology and elected to stop at this point.  The patient tolerated the procedure well.  Sponge, lap, and needle counts were correct x2. She was extubated in the OR, and transferred to the recovery room in stable  condition.     Alver Sorrow. Charlies Constable, M.D.     THL/MEDQ  D:  08/07/2013  T:  08/08/2013  Job:  [id number]

## 2013-08-14 ENCOUNTER — Encounter: Payer: Self-pay | Admitting: Family Medicine

## 2013-08-14 ENCOUNTER — Telehealth: Payer: Self-pay | Admitting: *Deleted

## 2013-08-14 ENCOUNTER — Ambulatory Visit (INDEPENDENT_AMBULATORY_CARE_PROVIDER_SITE_OTHER): Payer: BC Managed Care – PPO | Admitting: Family Medicine

## 2013-08-14 ENCOUNTER — Other Ambulatory Visit: Payer: Self-pay | Admitting: Family Medicine

## 2013-08-14 VITALS — BP 110/76 | HR 82 | Temp 98.0°F | Resp 16 | Ht 63.5 in | Wt 165.0 lb

## 2013-08-14 DIAGNOSIS — F411 Generalized anxiety disorder: Secondary | ICD-10-CM

## 2013-08-14 DIAGNOSIS — F5105 Insomnia due to other mental disorder: Secondary | ICD-10-CM

## 2013-08-14 DIAGNOSIS — F419 Anxiety disorder, unspecified: Secondary | ICD-10-CM

## 2013-08-14 DIAGNOSIS — E119 Type 2 diabetes mellitus without complications: Secondary | ICD-10-CM

## 2013-08-14 DIAGNOSIS — IMO0001 Reserved for inherently not codable concepts without codable children: Secondary | ICD-10-CM

## 2013-08-14 DIAGNOSIS — F489 Nonpsychotic mental disorder, unspecified: Secondary | ICD-10-CM

## 2013-08-14 DIAGNOSIS — E1165 Type 2 diabetes mellitus with hyperglycemia: Secondary | ICD-10-CM

## 2013-08-14 LAB — MICROALBUMIN, URINE: Microalb, Ur: 25.39 mg/dL — ABNORMAL HIGH (ref 0.00–1.89)

## 2013-08-14 MED ORDER — SITAGLIPTIN PHOS-METFORMIN HCL 50-1000 MG PO TABS: 1.0000 | ORAL_TABLET | Freq: Two times a day (BID) | ORAL | Status: DC

## 2013-08-14 MED ORDER — INSULIN GLARGINE 100 UNIT/ML SOLOSTAR PEN: 15.0000 [IU] | PEN_INJECTOR | Freq: Every day | SUBCUTANEOUS | Status: DC

## 2013-08-14 MED ORDER — LISINOPRIL 2.5 MG PO TABS: 2.5000 mg | ORAL_TABLET | Freq: Every day | ORAL | Status: DC

## 2013-08-14 NOTE — Telephone Encounter (Signed)
S/w patient's son ([name]) released signed in chart. Marland Kitchen He is aware of Dr. Brion Aliment recommendations is okay to come in Friday scheduled patient for 08/16/13 @ 11:00 am.

## 2013-08-14 NOTE — Telephone Encounter (Signed)
Pt returning call

## 2013-08-14 NOTE — Progress Notes (Signed)
Subjective:    Patient ID: Shelley Escobar, female    DOB: 12/06/1970, 43 y.o.   MRN: [id number]  HPI  This 43 y.o. Asian female is her for CPE but she underwent a GYN procedure 45 week ago; she agrees to a brief exam today and follow-up of chronic medical problems. Type II DM is uncontrolled since pt went off Insulin; she was switched to Glimepiride due to cost of Insulin. She has continued to take Janumet twice daily. Pt has new insurance plan and would like to resume Insulin. FSBS= 150- 200+. 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  Pt was prescribed Lisinopril but never picked up medication. She declines to take it at this time.  Patient Active Problem List   Diagnosis Date Noted  . Obesity (BMI 30.0-34.9) 01/27/2012  . DM (diabetes mellitus), type 2 07/02/2011  . Dyslipidemia 07/02/2011   PMHx, Surg Hx, Soc and Fam Hx reviewed.  MEDICATIONS reconciled.   Review of Systems  Constitutional: Negative.   HENT: Negative.   Eyes: Negative.   Respiratory: Negative.   Cardiovascular: Negative.   Gastrointestinal: Negative.   Endocrine: Negative.   Genitourinary: Negative.   Musculoskeletal: Negative.   Skin: Negative.   Allergic/Immunologic: Negative.   Neurological: Negative.   Hematological: Negative.   Psychiatric/Behavioral: Positive for sleep disturbance. The patient is nervous/anxious.       Objective:   Physical Exam  Nursing note and vitals reviewed. Constitutional: She is oriented to person, place, and time. Vital signs are normal. She appears well-developed and well-nourished. No distress.  HENT:  Head: Normocephalic and atraumatic.  Right Ear: Hearing, tympanic membrane, external ear and ear canal normal.  Left Ear: Hearing, tympanic membrane, external ear and ear canal normal.  Nose: Nose normal. No nasal deformity or septal deviation.  Mouth/Throat: Uvula is midline, oropharynx is clear and  moist and mucous membranes are normal. No oral lesions.  Eyes: Conjunctivae and EOM are normal. Pupils are equal, round, and reactive to light. No scleral icterus.  Neck: Normal range of motion. Neck supple. No thyromegaly present.  Cardiovascular: Normal rate, regular rhythm and normal heart sounds.  Exam reveals no gallop and no friction rub.   No murmur heard. Pulmonary/Chest: Effort normal and breath sounds normal. No respiratory distress.  Abdominal: Soft. Bowel sounds are normal. She exhibits no distension and no mass. There is no tenderness. There is no guarding.  Musculoskeletal: Normal range of motion. She exhibits no edema and no tenderness.  Lymphadenopathy:    She has no cervical adenopathy.  Neurological: [name], place, and time. No cranial nerve deficit. Coordination normal.  Skin: Skin is warm and dry. No rash noted. She is not diaphoretic. No erythema.  Psychiatric: Her speech is normal and behavior is normal. Judgment and thought content normal. Her mood appears anxious. Her affect is not angry and not inappropriate. Cognition and memory are normal. She does not exhibit a depressed mood.    07/23/2013: A1c= 12.3%     Assessment & Plan:  DM (diabetes mellitus), type 2 - Resume Lantus 15 units at bedtime. Continue Janumet. Discontinue Glimepiride.    Plan: Microalbumin, urine  Chronic anxiety- Discussion w/ pt about unhealthy consequences of chronic anxiety.  Insomnia secondary to anxiety-  Advised trial of Melatonin 3 mg at bedtime. Pt would rather not take prescription medication for this problem.  Meds ordered this encounter  Medications  . Insulin Glargine (LANTUS SOLOSTAR) 100 UNIT/ML Solostar Pen    Sig: Inject 15 Units into the skin daily at 10 pm.    Dispense:  5 pen    Refill:  11  . sitaGLIPtin-metformin (JANUMET) 50-1000 MG per tablet    Sig: Take 1 tablet by mouth 2 (two) times daily with a meal.    Dispense:  60 tablet     Refill:  3

## 2013-08-14 NOTE — Telephone Encounter (Signed)
Message copied by Alfonzo Feller on Wed Aug 14, 2013  3:44 PM ------      Message from: Elveria Rising      Created: Wed Aug 14, 2013  3:40 PM       Pt's son was asked to move her post-op to today, I had told him that it looked like megace affect and that we would be starting her on a new medication at today's visit, I see that the appt was not changed.      Path was as expected, but I would prefer to not wait 2w before starting her on a maintenance medication, can we squeeze her into Friday?  Did not call path due to language barrier and appt I thought was for this week, can inform ------

## 2013-08-14 NOTE — Progress Notes (Signed)
Quick Note:  Please advise pt/ family member regarding following labs... Urine test shows that pt needs to take a low dose of a medication to help protect her kidneys. I will prescribe Lisinopril but at a dose lower than what had previously been prescribed. She needs to take the medication daily to protect her kidneys against any further damage from Diabetes. ______

## 2013-08-14 NOTE — Telephone Encounter (Signed)
Left Message To Call Back  

## 2013-08-14 NOTE — Telephone Encounter (Signed)
Routed to provider, encounter closed.  

## 2013-08-14 NOTE — Patient Instructions (Signed)
Insulin has been restarted; Lantus looks like it is covered under your current insurance plan. Take 15 units at bedtime. Continue taking Janumet but the other medication (Glimepiride) has been stopped. Eat as healthy as you can. Once you have recovered from your recent surgery, start to get more active.  You have problems sleeping due to anxiety and worrying. You can try over-the-counter MELATONIN 3 mg  1 tablet at bedtime for more restful sleep.

## 2013-08-16 ENCOUNTER — Encounter: Payer: Self-pay | Admitting: Gynecology

## 2013-08-16 ENCOUNTER — Ambulatory Visit (INDEPENDENT_AMBULATORY_CARE_PROVIDER_SITE_OTHER): Payer: BC Managed Care – PPO | Admitting: Gynecology

## 2013-08-16 ENCOUNTER — Telehealth: Payer: Self-pay | Admitting: Emergency Medicine

## 2013-08-16 VITALS — BP 113/77 | HR 80 | Resp 16 | Ht 64.0 in | Wt 169.0 lb

## 2013-08-16 DIAGNOSIS — E119 Type 2 diabetes mellitus without complications: Secondary | ICD-10-CM

## 2013-08-16 DIAGNOSIS — N938 Other specified abnormal uterine and vaginal bleeding: Secondary | ICD-10-CM

## 2013-08-16 DIAGNOSIS — N760 Acute vaginitis: Secondary | ICD-10-CM

## 2013-08-16 DIAGNOSIS — N949 Unspecified condition associated with female genital organs and menstrual cycle: Secondary | ICD-10-CM

## 2013-08-16 DIAGNOSIS — N762 Acute vulvitis: Secondary | ICD-10-CM

## 2013-08-16 MED ORDER — TRIAMCINOLONE ACETONIDE 0.5 % EX OINT: 1.0000 "application " | TOPICAL_OINTMENT | Freq: Two times a day (BID) | CUTANEOUS | Status: DC

## 2013-08-16 MED ORDER — NORETHINDRONE 0.35 MG PO TABS: 1.0000 | ORAL_TABLET | Freq: Every day | ORAL | Status: DC

## 2013-08-16 NOTE — Telephone Encounter (Signed)
Message copied by Michele Mcalpine on Fri Aug 16, 2013 11:47 AM ------      Message from: Elveria Rising      Created: Wed Aug 14, 2013  3:40 PM       Pt's son was asked to move her post-op to today, I had told him that it looked like megace affect and that we would be starting her on a new medication at today's visit, I see that the appt was not changed.      Path was as expected, but I would prefer to not wait 2w before starting her on a maintenance medication, can we squeeze her into Friday?  Did not call path due to language barrier and appt I thought was for this week, can inform ------

## 2013-08-16 NOTE — Progress Notes (Signed)
Subjective:     Patient ID: Shelley Escobar, female   DOB: [birth year], 43 y.o.   MRN: [id number]  HPI Comments: Pt here with son post D&C hysteroscopy.  Pathology c/w megace affect.  Pt reports that bleeding very light since surgery, no fever or chills.  Pt started on lantus 2d ago.  Pt reports labial rash that started a few days ago-itchy.  Same as rash in November that responded to kenalog ointment    Review of Systems  Constitutional: Negative for fever, chills and fatigue.  Genitourinary: Positive for vaginal bleeding (min, no clots). Negative for vaginal discharge, vaginal pain and pelvic pain.       Objective:   Physical Exam  Nursing note and vitals reviewed. Constitutional: She is oriented to person, place, and time. She appears well-developed and well-nourished.  Neurological: [name], place, and time.  Skin: Skin is warm and dry.   Pelvic: External genitalia: single small lesion on left majora,              Urethra:  normal appearing urethra with no masses, tenderness or lesions              Bartholins and Skenes: normal                 Vagina: normal appearing vagina with normal color and discharge, no lesions, scant blood              Cervix: normal appearance, no active bleeding, nontender                  Bimanual Exam:  Uterus:  uterus is normal size, shape, consistency and nontender                                      Adnexa: normal adnexa in size, nontender and no masses                                          Assessment:     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

## 2013-08-16 NOTE — Telephone Encounter (Signed)
Patient has arrived for office visit today. Will close encounter.

## 2013-08-18 ENCOUNTER — Encounter: Payer: Self-pay | Admitting: Family Medicine

## 2013-08-19 ENCOUNTER — Telehealth: Payer: Self-pay | Admitting: Gynecology

## 2013-08-19 ENCOUNTER — Ambulatory Visit: Payer: BC Managed Care – PPO | Admitting: Gynecology

## 2013-08-19 NOTE — Telephone Encounter (Signed)
Advised patient son that her responsibility for in office ultrasound as quoted by insurance company is $25/ scheduled pus/ssf

## 2013-08-20 ENCOUNTER — Telehealth: Payer: Self-pay | Admitting: Gynecology

## 2013-08-20 NOTE — Telephone Encounter (Signed)
Spoke with son, Mr. Alfonse Spruce who translated for patient while on phone. States that last night patient started a cycle and having clots and some lower back cramping. No fevers. Describes clots as small. Changed her pad twice today. Offered office visit to evaluate for today and patient declines. Patient would like to monitor. I advised that bleeding with be irregular with start of new pills, but should regulate. Discussed bleeding emergencies with son, Advised patient/son to call back or seek immediate medical care if bleeding worsens or soaking through 1 pad/tampon per hour.  Mr. Alfonse Spruce verbalized understanding that he may call any time to discuss if worsens. Can take motrin/advil/ibuprofen for cramping as well. Agreeable to plan and will call back if worsens.   Routing to provider for final review. Patient agreeable to disposition. Will close encounter

## 2013-08-20 NOTE — Telephone Encounter (Signed)
Patient's son "[name]" calling stating patient is having heavy bleeding after taking medication. Please call.

## 2013-08-21 NOTE — Telephone Encounter (Signed)
agree

## 2013-08-27 ENCOUNTER — Ambulatory Visit (INDEPENDENT_AMBULATORY_CARE_PROVIDER_SITE_OTHER): Payer: BC Managed Care – PPO | Admitting: Gynecology

## 2013-08-27 ENCOUNTER — Ambulatory Visit (INDEPENDENT_AMBULATORY_CARE_PROVIDER_SITE_OTHER): Payer: BC Managed Care – PPO

## 2013-08-27 VITALS — BP 110/84 | HR 96 | Resp 16 | Ht 64.0 in | Wt 167.0 lb

## 2013-08-27 DIAGNOSIS — N938 Other specified abnormal uterine and vaginal bleeding: Secondary | ICD-10-CM

## 2013-08-27 DIAGNOSIS — N949 Unspecified condition associated with female genital organs and menstrual cycle: Secondary | ICD-10-CM

## 2013-08-27 DIAGNOSIS — E119 Type 2 diabetes mellitus without complications: Secondary | ICD-10-CM

## 2013-08-27 MED ORDER — NORETHINDRONE ACETATE 5 MG PO TABS: 5.0000 mg | ORAL_TABLET | Freq: Every day | ORAL | Status: DC

## 2013-08-27 NOTE — Progress Notes (Signed)
     Pt here with son, reports that bleeding now is a little more than  1w after her hysteroscopy when she wasn't on anything.  Her u/s images were reviewed.  There is blood noted in the cavity and the lining still appear thickened. Operative findings again reviewed. We discussed risks of endometrial cancer and importance of better gylcemic control. We suggested increasing the progestin by changing to aygestin and then slowly taper. Will repeat for 29m and reassess bleeding Agree to plan Questions addressd 25m spent discussing DUB management and diabetes, >50% face to face

## 2013-08-27 NOTE — Patient Instructions (Addendum)
Begin aygestin 3x/d until bleeding stops, then taper to twice a day for 1w and then once a day for 10d. Stop for 1w and expect to bleed. After 1w, restart twice a day for 1w then once a day for 2w, then off and bleeding should be lighter 3rd month once  A day for 3w  Stop mirconor

## 2013-09-05 ENCOUNTER — Telehealth: Payer: Self-pay | Admitting: Gynecology

## 2013-09-05 NOTE — Telephone Encounter (Signed)
Spoke with Mr. Shelley Escobar and message from Dr. Sabra Heck discussed. Patient will continue on Aygestin tid and give update on Monday. Will call back if bleeding worsens.

## 2013-09-05 NOTE — Telephone Encounter (Signed)
This is fine.  Have her son give an update on Monday.  CC:  Dr. Charlies Constable.  Encounter closed.

## 2013-09-05 NOTE — Telephone Encounter (Signed)
Spoke with son, Mr. Shelley Escobar. He declines use of vietnamese interpretor for triage of his Mother, Shelley Escobar. Patient has been on 3 tablets of aygestin since 09/04/13 and bleeding stopped initially but now has restarted, patient did not taper dose, has been on 3 per day. Today she woke up with brown spotting and now has developed red spotting/period bleeding and changing pad q 4 hours. He wants to clarify that she will need to take Aygestin 5 mg 3 tablets at once or 3 times per day. He states his mother has been feeling fatigued but still able to complete ADL's without issue. Advised would discuss with Dr. Sabra Heck (as covering provider) and call back with clarification and any changes if indicated. Mr. Shelley Escobar is agreeable.

## 2013-09-05 NOTE — Telephone Encounter (Signed)
Pt's son [name] calling because his mother is still bleeding. He wants to know if she still needs to take the medication or what should she do?

## 2013-09-09 ENCOUNTER — Telehealth: Payer: Self-pay | Admitting: Gynecology

## 2013-09-09 ENCOUNTER — Encounter: Payer: Self-pay | Admitting: Gynecology

## 2013-09-09 ENCOUNTER — Ambulatory Visit (INDEPENDENT_AMBULATORY_CARE_PROVIDER_SITE_OTHER): Payer: BC Managed Care – PPO | Admitting: Gynecology

## 2013-09-09 VITALS — BP 122/74 | HR 64 | Resp 12 | Ht 64.0 in | Wt 168.0 lb

## 2013-09-09 DIAGNOSIS — E119 Type 2 diabetes mellitus without complications: Secondary | ICD-10-CM

## 2013-09-09 DIAGNOSIS — N949 Unspecified condition associated with female genital organs and menstrual cycle: Secondary | ICD-10-CM

## 2013-09-09 DIAGNOSIS — N938 Other specified abnormal uterine and vaginal bleeding: Secondary | ICD-10-CM

## 2013-09-09 LAB — CBC
HCT: 39.2 % (ref 36.0–46.0)
Hemoglobin: 13.1 g/dL (ref 12.0–15.0)
MCH: 25.2 pg — ABNORMAL LOW (ref 26.0–34.0)
MCHC: 33.4 g/dL (ref 30.0–36.0)
MCV: 75.5 fL — ABNORMAL LOW (ref 78.0–100.0)
PLATELETS: 301 10*3/uL (ref 150–400)
RBC: 5.19 MIL/uL — AB (ref 3.87–5.11)
RDW: 13.7 % (ref 11.5–15.5)
WBC: 9.9 10*3/uL (ref 4.0–10.5)

## 2013-09-09 MED ORDER — NORETHINDRONE ACETATE 5 MG PO TABS: 5.0000 mg | ORAL_TABLET | Freq: Every day | ORAL | Status: DC

## 2013-09-09 NOTE — Telephone Encounter (Signed)
Patient's son calling to report continued problems with his mom having vaginal bleeding.

## 2013-09-09 NOTE — Progress Notes (Signed)
PT here with son, states that she is taking the aygestin 3x/d, bleeding is light, brown spotting mostly, sometimes red.  Pt can wear one pad/d.  Pt denies any cramping or pain.  She feels fatigued.  She has been on the aygestin for 2w tomorrow.  Pt started taking the aygestin 3 tablets at once for the past 3d with some improvement Pt is now on insulin and she is not taking fingerstick, she has run out of strips.    ROS: per HPI  BP 122/74  Pulse 64  Resp 12  Ht 5\' 4"  (1.626 m)  Wt 168 lb (76.204 kg)  BMI 28.82 kg/m2  LMP 08/07/2013 General appearance: alert, cooperative and appears stated age Abdomen: soft nontender Skin: Skin color, texture, turgor normal. No rashes or lesions  Pelvic: External genitalia:  no lesions              Urethra:  normal appearing urethra with no masses, tenderness or lesions              Bartholins and Skenes: normal                 Vagina: normal appearing vagina with normal color and discharge, no lesions              Cervix: normal appearance, no bleeding        Bimanual Exam:  Uterus:  uterus is normal size, shape, consistency and nontender                                      Adnexa: normal adnexa in size, nontender and no masses  A/P: Pt is improving on aygestin 15mg  qam. Discussed that she is a poor surgical candidate and will remain so if she continues to not take her insulin and not check her FS.  Risks due to poorly controled DM reviewed. Will keep her on 15mg /d and slowly taper  Stay on on aygestin 3 tablets at once until no longer bleeding for 7-10d Then drop down to 2tablets for 2w, then once a day Call if spotting/bleeding continues Follow up with primary regarding diabetes management  CBC  today

## 2013-09-09 NOTE — Telephone Encounter (Signed)
Spoke with patient's son. Son states that mother is still having bleeding and spotting. Office visit offered with Dr. Charlies Constable. Appointment scheduled for today at 1400.  Routing to provider for final review. Patient agreeable to disposition. Will close encounter

## 2013-09-09 NOTE — Patient Instructions (Signed)
Stay on on aygestin 3 tablets at once until no longer bleeding for 7-10d Then drop down to 2tablets for 2w, then once a day Call if spotting/bleeding continues Follow up with primary regarding diabetes management

## 2013-09-10 ENCOUNTER — Telehealth: Payer: Self-pay | Admitting: *Deleted

## 2013-09-10 DIAGNOSIS — N938 Other specified abnormal uterine and vaginal bleeding: Secondary | ICD-10-CM

## 2013-09-10 DIAGNOSIS — Z9851 Tubal ligation status: Secondary | ICD-10-CM

## 2013-09-10 NOTE — Telephone Encounter (Signed)
Patient's son returning Jasmine's call.

## 2013-09-10 NOTE — Telephone Encounter (Signed)
S/w patient and notified him of Dr. Charlies Constable recommendations of the Mirena IUD. That it's good for 5 years and he asked if this would stop his mom's  bleeding completely. I told him it's hard to estimate with each patient. But typically patients see little to no cycle at all. Told him that we would pre-cert it with his insurance first and then someone will call and schedule. He said he's going to do some research on it.  Routed to Dr. Charlies Constable to review

## 2013-09-10 NOTE — Telephone Encounter (Signed)
Message copied by Alfonzo Feller on Tue Sep 10, 2013  9:42 AM ------      Message from: Elveria Rising      Created: Tue Sep 10, 2013  9:01 AM       Inform cbc stable over last 63m, no need for additional iron ------

## 2013-09-10 NOTE — Telephone Encounter (Addendum)
Per Dr. Charlies Constable she may need to get a Progestin IUD and to call patient's son and let him know.  Left Message To Call Back

## 2013-09-10 NOTE — Telephone Encounter (Signed)
Can we see what her copay is for mirena iud? Dx DUB, pt has tubal

## 2013-09-10 NOTE — Telephone Encounter (Signed)
Patient's son notified (DPR in chart) he states that "when his mom came in yesterday she wasn't having any bleeding but around 8 pm last night she had a gush of blood bright red. It's not heavy now but it was last night.  Do you want patient to come in?  Please advise.

## 2013-09-10 NOTE — Telephone Encounter (Signed)
$  25 co pay

## 2013-09-10 NOTE — Telephone Encounter (Signed)
Encounter closed accidentally.

## 2013-09-10 NOTE — Telephone Encounter (Signed)
IUD $25? Please confirm, if so, would like to see if the progestin in IUD controls her bleeding, it will cover her for 5y which is also a benefit

## 2013-09-10 NOTE — Addendum Note (Signed)
Addended by: Jasmine Awe on: 09/10/2013 03:53 PM   Modules accepted: Orders

## 2013-09-10 NOTE — Telephone Encounter (Signed)
Left Message To Call Back  

## 2013-09-10 NOTE — Telephone Encounter (Signed)
Message copied by Alfonzo Feller on Tue Sep 10, 2013 11:07 AM ------      Message from: Elveria Rising      Created: Tue Sep 10, 2013  9:01 AM       Inform cbc stable over last 11m, no need for additional iron ------

## 2013-09-11 ENCOUNTER — Telehealth: Payer: Self-pay | Admitting: Gynecology

## 2013-09-11 NOTE — Telephone Encounter (Signed)
Spoke with son and scheduled for 3/27 at 0800.   Pre procedure instructions given.  Motrin instructions given. Motrin=Advil=Ibuprofen, 800 mg one hour before appointment. Eat a meal and hydrate well before appointment. Agreeable to instructions.   Routing to provider for final review. Patient agreeable to disposition. Will close encounter

## 2013-09-11 NOTE — Telephone Encounter (Signed)
Left message to call Olivia Mackie at [phone].

## 2013-09-11 NOTE — Telephone Encounter (Signed)
Message left to return call to Ki Corbo at [phone].    

## 2013-09-11 NOTE — Telephone Encounter (Signed)
Spoke with son. He states that he gave patient [place] translation about Mirena IUD. Discussed insurance benefits. He states that patient is agreeable and would like to schedule.   Dr. Charlies Constable, since patient is currently bleeding okay to schedule IUD insertion at any date?

## 2013-09-11 NOTE — Telephone Encounter (Signed)
After making several calls to [employer], I finally received a benefits quote that I feel comfortable sharing with the patient for IUD insertion. Per [facility], the service will be covered at 70/30 since the patient has met her calendar year deductible. Patient liability would then be $291.56.

## 2013-09-11 NOTE — Telephone Encounter (Signed)
yes

## 2013-09-13 ENCOUNTER — Encounter: Payer: Self-pay | Admitting: Gynecology

## 2013-09-13 ENCOUNTER — Ambulatory Visit (INDEPENDENT_AMBULATORY_CARE_PROVIDER_SITE_OTHER): Payer: BC Managed Care – PPO | Admitting: Gynecology

## 2013-09-13 VITALS — BP 106/70 | Resp 14 | Ht 64.0 in | Wt 167.0 lb

## 2013-09-13 DIAGNOSIS — Z9851 Tubal ligation status: Secondary | ICD-10-CM

## 2013-09-13 DIAGNOSIS — N949 Unspecified condition associated with female genital organs and menstrual cycle: Secondary | ICD-10-CM

## 2013-09-13 DIAGNOSIS — N938 Other specified abnormal uterine and vaginal bleeding: Secondary | ICD-10-CM

## 2013-09-13 HISTORY — PX: INTRAUTERINE DEVICE (IUD) INSERTION: SHX5877

## 2013-09-13 NOTE — Progress Notes (Signed)
84 yrsMarriedAsianfemale presents for  insertion of Mirena. Denies any vaginal symptoms or STD concerns. For treatment of DUB.  Son reports that pt has bright red bleeding after last office visit and then nothing again until last night-but very light, no clots.  Pt has been on aygestin 15mg /dPt not sexually active, has BTL. Son present translator   Patient read information regarding IUD insertion.  All questions addressed.    Healthy female,time, place and personnormal menses, no abnormal bleeding, pelvic pain or discharge, no breast pain or new or enlarging lumps on self exam Abdomen: soft, non-tender Groin[place] inguinal nodes palpated  Pelvic exam: Vulva;normal female genitalia  Vagina:normal vagina  Cervix:Non-tender, Negative CMT, no lesions or redness, nulliparous/parous os  Uterus:normal shape, position and consistency    Procedure:  Speculum inserted into vagina. Cervix visualized and cleansed with betadine solution X 3, xylocaine jelly placed in endocervix and anterior lip. Tenaculum placed on cervix at 12 o'clock position(s).  Uterus sounded to 7 centimeters.  IUD removed from sterile packet and under sterile conditions inserted to fundus of uterus.  Introducer removed without difficulty.  IUD string trimmed to 3 centimeters.  Remainder string given to patient to feel for identification.  Tenaculum removed.  No bleeding noted.  Speculum removed.  Uterus palpated normal.  Patient tolerated procedure well.  A: Insertion of Mirena, Lot # [id number], Expiration date 8/16   P:  Instructions and warnings signs given.       IUD identification card given with IUD removal 08/2018 or menopasue       Return visit 9m

## 2013-09-17 ENCOUNTER — Telehealth: Payer: Self-pay | Admitting: Gynecology

## 2013-09-17 NOTE — Telephone Encounter (Signed)
Pt's son calling to talk with the nurse about his mom. How long is she suppose to be bleeding after having the procedure last week?

## 2013-09-17 NOTE — Telephone Encounter (Signed)
Spoke with pt's son [name] who translates for pt. Pt had Mirena inserted on Friday 09-13-13. Pt noticed spotting on Saturday which became heavier later that day. Pt wearing pad that she changes about every 2 hours when bleeding is heaviest. Reports bleeding comes and goes. It will taper at times, then start heavily again. Having some cramping and clots.  Not much bleeding while she sleeps, but when she gets up, it comes back. Is this to be expected? Please advise.

## 2013-09-17 NOTE — Telephone Encounter (Signed)
Discussed pt's bleeding with TL. Bleeding is to be expected, as pt was on Megace for a while. Spoke with pt's son to deliver message from TL. Pt to be seen in a month for follow-up. Pt will take ibuprofen for cramps and will call if bleeding becomes heavier and if she soaks a pad in an hour.

## 2013-09-19 ENCOUNTER — Telehealth: Payer: Self-pay

## 2013-09-19 MED ORDER — INSULIN PEN NEEDLE 31G X 5 MM MISC: 1.0000 | Freq: Every day | Status: DC

## 2013-09-19 NOTE — Telephone Encounter (Signed)
Advised son rx sent for needles.

## 2013-09-19 NOTE — Telephone Encounter (Signed)
PATIENT'S SON ([name]) CALLED TO SAY THAT HIS MOTHER NEEDS A PRESCRIPTION CALLED IN FOR HER DIABETIC NEEDLES. SHE USES ATLANTIS SOLOR STAR ULTRA THIN NEEDLES. PLEASE CALL HIM WHEN IT HAS BEEN DONE.  BEST PHONE [phone] (SON'S NAME IS [name] NGUYEN)    PHARMACY CHOICE IS [facility] ON [address].   [place]

## 2013-10-17 ENCOUNTER — Ambulatory Visit (INDEPENDENT_AMBULATORY_CARE_PROVIDER_SITE_OTHER): Payer: BC Managed Care – PPO | Admitting: Obstetrics and Gynecology

## 2013-10-17 ENCOUNTER — Encounter: Payer: Self-pay | Admitting: Obstetrics and Gynecology

## 2013-10-17 VITALS — BP 98/60 | HR 64 | Ht 64.0 in | Wt 168.0 lb

## 2013-10-17 DIAGNOSIS — Z30431 Encounter for routine checking of intrauterine contraceptive device: Secondary | ICD-10-CM

## 2013-10-17 NOTE — Progress Notes (Signed)
GYNECOLOGY  VISIT   HPI: 43 y.o.   Married  Asian  female   [phone] with No LMP recorded. Patient is not currently having periods (Reason: IUD).   here for  Vaginal Bleeding. Patient is her for an IUD check. Was expecting to see her primary GYN, Dr. Charlies Constable.   Patient's son is here to interpret for the visit.   GYNECOLOGIC HISTORY: No LMP recorded. Patient is not currently having periods (Reason: IUD). Contraception:   IUD          OB History   Grav Para Term Preterm Abortions TAB SAB Ect Mult Living   3 3 3       3          Patient Active Problem List   Diagnosis Date Noted  . Obesity (BMI 30.0-34.9) 01/27/2012  . DM (diabetes mellitus), type 2 07/02/2011  . Dyslipidemia 07/02/2011    Past Medical History  Diagnosis Date  . Hyperlipidemia   . DM (diabetes mellitus)   . Vitamin D deficiency   . SVD (spontaneous vaginal delivery)     x 3  . Anemia     hx    Past Surgical History  Procedure Laterality Date  . Tubal ligation      Age 26  . Hysteroscopy w/d&c N/A 08/07/2013    Procedure: DILATATION AND CURETTAGE /HYSTEROSCOPY;  Surgeon: Azalia Bilis, MD;  Location: [facility];  Service: Gynecology;  Laterality: N/A;  . Intrauterine device (iud) insertion  09/13/2013    Current Outpatient Prescriptions  Medication Sig Dispense Refill  . gemfibrozil (LOPID) 600 MG tablet Take 1 tablet (600 mg total) by mouth 2 (two) times daily.  60 tablet  5  . ibuprofen (ADVIL,MOTRIN) 200 MG tablet Take 200 mg by mouth every 6 (six) hours as needed for headache.      . Insulin Glargine (LANTUS SOLOSTAR) 100 UNIT/ML Solostar Pen Inject 15 Units into the skin daily at 10 pm.  5 pen  11  . Insulin Pen Needle 31G X 5 MM MISC 1 each by Does not apply route daily.  100 each  3  . lisinopril (ZESTRIL) 2.5 MG tablet Take 1 tablet (2.5 mg total) by mouth daily.  30 tablet  5  . norethindrone (AYGESTIN) 5 MG tablet Take 1 tablet (5 mg total) by mouth daily.  90 tablet  0  . sitaGLIPtin-metformin  (JANUMET) 50-1000 MG per tablet Take 1 tablet by mouth 2 (two) times daily with a meal.  60 tablet  3  . triamcinolone ointment (KENALOG) 0.5 % Apply 1 application topically 2 (two) times daily.  15 g  0   No current facility-administered medications for this visit.     ALLERGIES: Review of patient's allergies indicates no known allergies.  History reviewed. No pertinent family history.  History   Social History  . Marital Status: Married    Spouse Name: N/A    Number of Children: N/A  . Years of Education: N/A   Occupational History  . sales    Social History Main Topics  . Smoking status: Never Smoker   . Smokeless tobacco: Never Used  . Alcohol Use: No  . Drug Use: No  . Sexual Activity: Yes    Partners: Female    Birth Control/ Protection: Surgical   Other Topics Concern  . Not on file   Social History Narrative   Exercise walking 4 days per week for 30 minutes. Married. Education : Other    ROS:  Pertinent items are noted in HPI.  PHYSICAL EXAMINATION:    BP 98/60  Pulse 64  Ht 5\' 4"  (1.626 m)  Wt 168 lb (76.204 kg)  BMI 28.82 kg/m2     General appearance: alert, cooperative and appears stated age   ASSESSMENT  Status post Mirena IUD placement for dysfunctional uterine bleeding.    PLAN  Patient given the option to proceed with visit today or return to see her Dr. Charlies Constable. Patient will see Dr. Charlies Constable next week. Return sooner for heavy bleeding with pad change every hour.

## 2013-10-21 ENCOUNTER — Ambulatory Visit (INDEPENDENT_AMBULATORY_CARE_PROVIDER_SITE_OTHER): Payer: BC Managed Care – PPO | Admitting: Gynecology

## 2013-10-21 ENCOUNTER — Encounter: Payer: Self-pay | Admitting: Gynecology

## 2013-10-21 VITALS — BP 92/60 | HR 80 | Resp 16 | Ht 64.0 in | Wt 167.0 lb

## 2013-10-21 DIAGNOSIS — N938 Other specified abnormal uterine and vaginal bleeding: Secondary | ICD-10-CM

## 2013-10-21 DIAGNOSIS — N949 Unspecified condition associated with female genital organs and menstrual cycle: Secondary | ICD-10-CM

## 2013-10-21 DIAGNOSIS — Z30431 Encounter for routine checking of intrauterine contraceptive device: Secondary | ICD-10-CM

## 2013-10-21 NOTE — Progress Notes (Signed)
Pt had IUD placed 5w ago.  Pt states that she had a "cycle" last week lasting 4d, light during the day but only one pad at night.  No cramping, fever or pain.  Pt is without complaints.  Not checking blood sugar "but feels ok"  ROS: per HPI   Pelvic: External genitalia:  no lesions              Urethra:  normal appearing urethra with no masses, tenderness or lesions              Bartholins and Skenes: normal                 Vagina: normal appearing vagina with normal color and discharge, no lesions              Cervix: normal appearance, string palpated                Bimanual Exam:  Uterus:  uterus is normal size, shape, consistency and nontender                                      Adnexa: normal adnexa in size, nontender and no masses        A/P: DUB Poorly controled diabetic mirena IUD management  Working well, pleased Reminded mammogram due this month Annual 04/2014

## 2013-10-25 ENCOUNTER — Other Ambulatory Visit: Payer: Self-pay | Admitting: Family Medicine

## 2013-10-28 NOTE — Telephone Encounter (Signed)
Dr Leward Quan, you saw pt in Feb for check up but not for this med. Can we RF until her next 6 mos check up?

## 2013-10-29 MED ORDER — GEMFIBROZIL 600 MG PO TABS: ORAL_TABLET | ORAL | Status: DC

## 2013-11-07 ENCOUNTER — Encounter: Payer: Self-pay | Admitting: Family Medicine

## 2013-11-07 ENCOUNTER — Ambulatory Visit (INDEPENDENT_AMBULATORY_CARE_PROVIDER_SITE_OTHER): Payer: BC Managed Care – PPO | Admitting: Family Medicine

## 2013-11-07 VITALS — BP 110/66 | HR 80 | Temp 98.0°F | Resp 16 | Ht 63.75 in | Wt 165.8 lb

## 2013-11-07 DIAGNOSIS — M79645 Pain in left finger(s): Secondary | ICD-10-CM

## 2013-11-07 DIAGNOSIS — IMO0001 Reserved for inherently not codable concepts without codable children: Secondary | ICD-10-CM

## 2013-11-07 DIAGNOSIS — M79609 Pain in unspecified limb: Secondary | ICD-10-CM

## 2013-11-07 DIAGNOSIS — E119 Type 2 diabetes mellitus without complications: Secondary | ICD-10-CM

## 2013-11-07 DIAGNOSIS — G8929 Other chronic pain: Secondary | ICD-10-CM

## 2013-11-07 DIAGNOSIS — E1165 Type 2 diabetes mellitus with hyperglycemia: Secondary | ICD-10-CM

## 2013-11-07 LAB — POCT GLYCOSYLATED HEMOGLOBIN (HGB A1C): Hemoglobin A1C: 12

## 2013-11-07 MED ORDER — INSULIN GLARGINE 100 UNIT/ML SOLOSTAR PEN: 22.0000 [IU] | PEN_INJECTOR | Freq: Every day | SUBCUTANEOUS | Status: DC

## 2013-11-07 MED ORDER — SITAGLIPTIN PHOS-METFORMIN HCL 50-1000 MG PO TABS: 1.0000 | ORAL_TABLET | Freq: Two times a day (BID) | ORAL | Status: DC

## 2013-11-07 NOTE — Progress Notes (Signed)
S:  This 43 y.o. Asian female is here w/ her son for DM follow-up. She is compliant w/ medications 95% of the time; she missed her Insulin dose last night. She is not checking FSBS because strips are too costly. Pt had IUD insertion recently and was advised by GYN to have Insulin increased. No reports of anorexia, fatigue, weight loss, diaphoresis, vision changes, CP or tightness, palpitations, edema, cough, GI problems w/ meds, Papadopoulos, dizziness, weakness, numbness or syncope.  Pt c/o left thumb pain; the thumb catches and sticks. Padding over L thumb is sore. She is dropping items and has a weak grip. Pt is left-handed. Ibuprofen provides temporary relief. Pt's son reports he had the same problem in his index finger; Dr. Fredna Dow did 2 injections and this relieved his problem.  Patient Active Problem List   Diagnosis Date Noted  . Obesity (BMI 30.0-34.9) 01/27/2012  . DM (diabetes mellitus), type 2 07/02/2011  . Dyslipidemia 07/02/2011   Prior to Admission medications   Medication Sig Start Date End Date Taking? Authorizing Provider  gemfibrozil (LOPID) 600 MG tablet TAKE 1 TABLET BY MOUTH TWICE DAILY   Yes Barton Fanny, MD  ibuprofen (ADVIL,MOTRIN) 200 MG tablet Take 200 mg by mouth every 6 (six) hours as needed for headache.   Yes Historical Provider, MD  Insulin Glargine (LANTUS SOLOSTAR) 100 UNIT/ML Solostar Pen Inject 15 Units into the skin daily at 10 pm. 08/14/13  Yes Barton Fanny, MD  Insulin Pen Needle 31G X 5 MM MISC 1 each by Does not apply route daily. 09/19/13  Yes Ryan M Dunn, PA-C  lisinopril (ZESTRIL) 2.5 MG tablet Take 1 tablet (2.5 mg total) by mouth daily. 08/14/13  Yes Barton Fanny, MD  sitaGLIPtin-metformin (JANUMET) 50-1000 MG per tablet Take 1 tablet by mouth 2 (two) times daily with a meal.   Yes Barton Fanny, MD  norethindrone (AYGESTIN) 5 MG tablet Take 1 tablet (5 mg total) by mouth daily. 09/09/13   Azalia Bilis, MD  triamcinolone ointment  (KENALOG) 0.5 % Apply 1 application topically 2 (two) times daily. 08/16/13   Azalia Bilis, MD   PMHx, Surg Hx, Soc and Fam Hx reviewed.   ROS: As per HPI.  O: Filed Vitals:   11/07/13 1029  BP: 110/66  Pulse: 80  Temp: 98 F (36.7 C)  Resp: 16   GEN: In NAD; WN,WD. HENT: [place]/AT; EOMI w/ clear conj/sclerae. Otherwise unremarkable. NECK: Supple w/o LAN or TMG. COR: RRR. Normal S1 and S2. No m/g/r.  LUNGS: Unlabored resp. No wheezes or rales. SKIN: W&D; intact w/o diaphoresis, erythema, pallor or jaundice. See DM Foot Exam. MS: MAEs; no deformities or muscle atrophy. L thumb- slightly swollen thenar eminence and decreased oppositional strength. Good ROM.  No c/c/e. NEURO: A&O x 3; CNs intact. Normal gait and coordination. Nonfocal. PSYCH: Pleasant, calm and attentive; conversant w/ her son as interpreter.   Results for orders placed in visit on 11/07/13  POCT GLYCOSYLATED HEMOGLOBIN (HGB A1C)      Result Value Ref Range   Hemoglobin A1C 12.0      A/P: Type II or unspecified type diabetes mellitus without mention of complication, not stated as uncontrolled - Increase Insulin to 22 units hs. Consider increasing to 25 units hs in 3-4 weeks if BS > 200. Continue Janumet twice a day. Reduce intake of starchy foods. Emphasized need for FSBS.  Plan: HM Diabetes Foot Exam, POCT glycosylated hemoglobin (Hb A1C)  Chronic pain of  left thumb - Plan: Ambulatory referral to Hand Surgery   Meds ordered this encounter  Medications  . sitaGLIPtin-metformin (JANUMET) 50-1000 MG per tablet    Sig: Take 1 tablet by mouth 2 (two) times daily with a meal.    Dispense:  60 tablet    Refill:  5  . Insulin Glargine (LANTUS SOLOSTAR) 100 UNIT/ML Solostar Pen    Sig: Inject 22 Units into the skin daily at 10 pm.    Dispense:  5 pen    Refill:  11

## 2013-11-07 NOTE — Patient Instructions (Addendum)
Increase Insulin dose to 22 units at bedtime.  Try to eat less rice and starchy foods. Stay active.  I have referred you to Dr. Fredna Dow for evaluation of the thumb problem.

## 2013-11-13 ENCOUNTER — Ambulatory Visit: Payer: BC Managed Care – PPO | Admitting: Family Medicine

## 2013-11-27 ENCOUNTER — Ambulatory Visit: Payer: BC Managed Care – PPO | Admitting: Gynecology

## 2013-12-23 NOTE — Telephone Encounter (Signed)
See next encounters regarding surgery.  Routing to provider for final review. Patient agreeable to disposition. Will close encounter

## 2014-01-29 ENCOUNTER — Encounter: Payer: Self-pay | Admitting: Family Medicine

## 2014-01-29 ENCOUNTER — Ambulatory Visit (INDEPENDENT_AMBULATORY_CARE_PROVIDER_SITE_OTHER): Payer: BC Managed Care – PPO | Admitting: Family Medicine

## 2014-01-29 VITALS — BP 108/71 | HR 78 | Temp 97.9°F | Resp 16 | Ht 63.5 in | Wt 168.0 lb

## 2014-01-29 DIAGNOSIS — E119 Type 2 diabetes mellitus without complications: Secondary | ICD-10-CM

## 2014-01-29 LAB — POCT GLYCOSYLATED HEMOGLOBIN (HGB A1C): Hemoglobin A1C: 10.5

## 2014-01-29 LAB — GLUCOSE, POCT (MANUAL RESULT ENTRY): POC Glucose: 259 mg/dl — AB (ref 70–99)

## 2014-01-29 MED ORDER — GEMFIBROZIL 600 MG PO TABS: ORAL_TABLET | ORAL | Status: DC

## 2014-01-29 MED ORDER — INSULIN GLARGINE 100 UNIT/ML SOLOSTAR PEN: 25.0000 [IU] | PEN_INJECTOR | Freq: Every day | SUBCUTANEOUS | Status: DC

## 2014-01-29 NOTE — Progress Notes (Signed)
S:  This 43 y.o. Asian female returns for DM follow-up.  May 2015 A1c= 12.0%. She resumed basal Insulin with good results. Son is interpreting for pt; he reports no FSBS due to cost of strips. Pt feels well and has no symptoms c/w hypoglycemia. She is compliant w/ all medications but has stopped taking Lisinopril (pt wants to take as little medication as possible). Appetite is good; pt tries to eat nutritiously. She has occasional HAs but this is related to noisy workplace.   Patient Active Problem List   Diagnosis Date Noted  . Obesity (BMI 30.0-34.9) 01/27/2012  . DM (diabetes mellitus), type 2 07/02/2011  . Dyslipidemia 07/02/2011    Prior to Admission medications   Medication Sig Start Date End Date Taking? Authorizing Provider  gemfibrozil (LOPID) 600 MG tablet TAKE 1 TABLET BY MOUTH TWICE DAILY   Yes Barton Fanny, MD  ibuprofen (ADVIL,MOTRIN) 200 MG tablet Take 200 mg by mouth every 6 (six) hours as needed for headache.   Yes Historical Provider, MD  Insulin Glargine (LANTUS SOLOSTAR) 100 UNIT/ML Solostar Pen Inject 22 Units into the skin daily at 10 pm.   Yes Barton Fanny, MD  Insulin Pen Needle 31G X 5 MM MISC 1 each by Does not apply route daily. 09/19/13  Yes Ryan M Dunn, PA-C  sitaGLIPtin-metformin (JANUMET) 50-1000 MG per tablet Take 1 tablet by mouth 2 (two) times daily with a meal. 11/07/13  Yes Barton Fanny, MD  lisinopril (ZESTRIL) 2.5 MG tablet Take 1 tablet (2.5 mg total) by mouth daily. 08/14/13   Barton Fanny, MD    No Known Allergies   Past Surgical History  Procedure Laterality Date  . Tubal ligation      Age 65  . Hysteroscopy w/d&c N/A 08/07/2013    Procedure: DILATATION AND CURETTAGE /HYSTEROSCOPY;  Surgeon: Azalia Bilis, MD;  Location: [facility];  Service: Gynecology;  Laterality: N/A;  . Intrauterine device (iud) insertion  09/13/2013    No family history on file.   History   Social History  . Marital Status: Married    Spouse  Name: N/A    Number of Children: N/A  . Years of Education: N/A   Occupational History  . sales    Social History Main Topics  . Smoking status: Never Smoker   . Smokeless tobacco: Never Used  . Alcohol Use: No  . Drug Use: No  . Sexual Activity: Yes    Partners: Female    Birth Control/ Protection: Surgical   Other Topics Concern  . Not on file   Social History Narrative   Exercise walking 4 days per week for 30 minutes. Married. Education : Other    ROS: As per HPI.  O: Filed Vitals:   01/29/14 1005  BP: 108/71  Pulse: 78  Temp: 97.9 F (36.6 C)  Resp: 16   GEN: In NAD; WN,WD. Weight up ~ 2-3 lbs. HENT: [place]/AT; EOMI w/ clear conj/sclerae. Otherwise unremarkable. COR: RRR. LUNGS: Normal resp rate and effort. SKIN: W&D; intact. No diaphoresis or erythema or rashes. See DM FOOT EXAM. NEURO: A&O x 3; CNs intact. Nonfocal.   Results for orders placed in visit on 01/29/14  GLUCOSE, POCT (MANUAL RESULT ENTRY)      Result Value Ref Range   POC Glucose 259 (*) 70 - 99 mg/dl  POCT GLYCOSYLATED HEMOGLOBIN (HGB A1C)      Result Value Ref Range   Hemoglobin A1C 10.5  A/P: Type II or unspecified type diabetes mellitus without mention of complication, not stated as uncontrolled - pt pleased w/ progress; will increase basal Insulin to 25 units. Plan: HM Diabetes Foot Exam, POCT glucose (manual entry), POCT glycosylated hemoglobin (Hb A1C)  Meds ordered this encounter  Medications  . gemfibrozil (LOPID) 600 MG tablet    Sig: TAKE 1 TABLET BY MOUTH TWICE DAILY    Dispense:  60 tablet    Refill:  5  . Insulin Glargine (LANTUS SOLOSTAR) 100 UNIT/ML Solostar Pen    Sig: Inject 25 Units into the skin daily at 10 pm.    Dispense:  5 pen    Refill:  11

## 2014-01-29 NOTE — Patient Instructions (Signed)
Increase Insulin to 25 units at bedtime...  I am including information about healthy eating-      Mediterranean Diet  Why follow it? Research shows.   Those who follow the Mediterranean diet have a reduced risk of heart disease    The diet is associated with a reduced incidence of Parkinson's and Alzheimer's diseases   People following the diet may have longer life expectancies and lower rates of chronic diseases    The Dietary Guidelines for Americans recommends the Mediterranean diet as an eating plan to promote health and prevent disease  What Is the Mediterranean Diet?    Healthy eating plan based on typical foods and recipes of Mediterranean-style cooking   The diet is primarily a plant based diet; these foods should make up a majority of meals   Starches - Plant based foods should make up a majority of meals - They are an important sources of vitamins, minerals, energy, antioxidants, and fiber - Choose whole grains, foods high in fiber and minimally processed items  - Typical grain sources include wheat, oats, barley, corn, brown rice, bulgar, farro, millet, polenta, couscous  - Various types of beans include chickpeas, lentils, fava beans, black beans, white beans   Fruits  Veggies - Large quantities of antioxidant rich fruits & veggies; 6 or more servings  - Vegetables can be eaten raw or lightly drizzled with oil and cooked  - Vegetables common to the traditional Mediterranean Diet include: artichokes, arugula, beets, broccoli, brussel sprouts, cabbage, carrots, celery, collard greens, cucumbers, eggplant, kale, leeks, lemons, lettuce, mushrooms, okra, onions, peas, peppers, potatoes, pumpkin, radishes, rutabaga, shallots, spinach, sweet potatoes, turnips, zucchini - Fruits common to the Mediterranean Diet include: apples, apricots, avocados, cherries, clementines, dates, figs, grapefruits, grapes, melons, nectarines, oranges, peaches, pears, pomegranates, strawberries, tangerines   Fats - Replace butter and margarine with healthy oils, such as olive oil, canola oil, and tahini  - Limit nuts to no more than a handful a day  - Nuts include walnuts, almonds, pecans, pistachios, pine nuts  - Limit or avoid candied, honey roasted or heavily salted nuts - Olives are central to the [employer] - can be eaten whole or used in a variety of dishes   Meats Protein - Limiting red meat: no more than a few times a month - When eating red meat: choose lean cuts and keep the portion to the size of deck of cards - Eggs: approx. 0 to 4 times a week  - Fish and lean poultry: at least 2 a week  - Healthy protein sources include, chicken, [place], lean beef, lamb - Increase intake of seafood such as tuna, salmon, trout, mackerel, shrimp, scallops - Avoid or limit high fat processed meats such as sausage and bacon  Dairy - Include moderate amounts of low fat dairy products  - Focus on healthy dairy such as fat free yogurt, skim milk, low or reduced fat cheese - Limit dairy products higher in fat such as whole or 2% milk, cheese, ice cream  Alcohol - Moderate amounts of red wine is ok  - No more than 5 oz daily for women (all ages) and men older than age 6  - No more than 10 oz of wine daily for men younger than 42  Other - Limit sweets and other desserts  - Use herbs and spices instead of salt to flavor foods  - Herbs and spices common to the traditional Mediterranean Diet include: basil, bay leaves, chives, cloves, cumin, fennel,  garlic, lavender, marjoram, mint, oregano, parsley, pepper, rosemary, sage, savory, sumac, tarragon, thyme   It's not just a diet, it's a lifestyle:    The Mediterranean diet includes lifestyle factors typical of those in the region    Foods, drinks and meals are best eaten with others and savored   Daily physical activity is important for overall good health   This could be strenuous exercise like running and aerobics   This could also be more  leisurely activities such as walking, housework, yard-work, or taking the stairs   Moderation is the key; a balanced and healthy diet accommodates most foods and drinks   Consider portion sizes and frequency of consumption of certain foods   Meal Ideas & Options:    Breakfast:  o Whole wheat toast or whole wheat English muffins with peanut butter & hard boiled egg o Steel cut oats topped with apples & cinnamon and skim milk  o Fresh fruit: banana, strawberries, melon, berries, peaches  o Smoothies: strawberries, bananas, greek yogurt, peanut butter o Low fat greek yogurt with blueberries and granola  o Egg white omelet with spinach and mushrooms o Breakfast couscous: whole wheat couscous, apricots, skim milk, cranberries    Sandwiches:  o Hummus and grilled vegetables (peppers, zucchini, squash) on whole wheat bread   o Grilled chicken on whole wheat pita with lettuce, tomatoes, cucumbers or tzatziki  o Tuna salad on whole wheat bread: tuna salad made with greek yogurt, olives, red peppers, capers, green onions o Garlic rosemary lamb pita: lamb sauted with garlic, rosemary, salt & pepper; add lettuce, cucumber, greek yogurt to pita - flavor with lemon juice and black pepper    Seafood:  o Mediterranean grilled salmon, seasoned with garlic, basil, parsley, lemon juice and black pepper o Shrimp, lemon, and spinach whole-grain pasta salad made with low fat greek yogurt  o Seared scallops with lemon orzo  o Seared tuna steaks seasoned salt, pepper, coriander topped with tomato mixture of olives, tomatoes, olive oil, minced garlic, parsley, green onions and cappers    Meats:  o Herbed greek chicken salad with kalamata olives, cucumber, feta  o Red bell peppers stuffed with spinach, bulgur, lean ground beef (or lentils) & topped with feta   o Kebabs: skewers of chicken, tomatoes, onions, zucchini, squash  o [place] burgers: made with red onions, mint, dill, lemon juice, feta cheese topped with  roasted red peppers   Vegetarian o Cucumber salad: cucumbers, artichoke hearts, celery, red onion, feta cheese, tossed in olive oil & lemon juice  o Hummus and whole grain pita points with a greek salad (lettuce, tomato, feta, olives, cucumbers, red onion) o Lentil soup with celery, carrots made with vegetable broth, garlic, salt and pepper  o Tabouli salad: parsley, bulgur, mint, scallions, cucumbers, tomato, radishes, lemon juice, olive oil, salt and pepper. o

## 2014-03-17 ENCOUNTER — Ambulatory Visit (INDEPENDENT_AMBULATORY_CARE_PROVIDER_SITE_OTHER): Payer: BC Managed Care – PPO | Admitting: Obstetrics and Gynecology

## 2014-03-17 ENCOUNTER — Encounter: Payer: Self-pay | Admitting: Obstetrics and Gynecology

## 2014-03-17 VITALS — BP 110/76 | HR 80 | Resp 16 | Ht 63.5 in | Wt 173.0 lb

## 2014-03-17 DIAGNOSIS — B373 Candidiasis of vulva and vagina: Secondary | ICD-10-CM

## 2014-03-17 DIAGNOSIS — B3731 Acute candidiasis of vulva and vagina: Secondary | ICD-10-CM

## 2014-03-17 DIAGNOSIS — Z30431 Encounter for routine checking of intrauterine contraceptive device: Secondary | ICD-10-CM

## 2014-03-17 MED ORDER — NYSTATIN-TRIAMCINOLONE 100000-0.1 UNIT/GM-% EX CREA: 1.0000 "application " | TOPICAL_CREAM | Freq: Two times a day (BID) | CUTANEOUS | Status: DC

## 2014-03-17 MED ORDER — FLUCONAZOLE 150 MG PO TABS: 150.0000 mg | ORAL_TABLET | Freq: Once | ORAL | Status: DC

## 2014-03-17 NOTE — Progress Notes (Signed)
Patient ID: Shelley Escobar, female   DOB: 17-Jul-1970, 43 y.o.   MRN: [id number]  GYNECOLOGY  VISIT   [id number] 43 y.o.   Married  Asian  female   [phone] with No LMP recorded. Patient is not currently having periods (Reason: IUD).   here for a vaginitis check.  Patient is here today with her daughter in law.  Patient gives permission for her to be in the room.  Feeling external vulvar itching.  No vaginal drainage.  Comes and goes.  Did over the counter cortisone cream.  No help at all.  No recentt antibiotics.  No new exposures.   Has menses on her Mirena IUD.   Has diabetes.   GYNECOLOGIC HISTORY: No LMP recorded. Patient is not currently having periods (Reason: IUD). Contraception:            OB History   Grav Para Term Preterm Abortions TAB SAB Ect Mult Living   3 3 3       3          Patient Active Problem List   Diagnosis Date Noted  . Obesity (BMI 30.0-34.9) 01/27/2012  . DM (diabetes mellitus), type 2 07/02/2011  . Dyslipidemia 07/02/2011    Past Medical History  Diagnosis Date  . Hyperlipidemia   . DM (diabetes mellitus)   . Vitamin D deficiency   . SVD (spontaneous vaginal delivery)     x 3  . Anemia     hx    Past Surgical History  Procedure Laterality Date  . Tubal ligation      Age 15  . Hysteroscopy w/d&c N/A 08/07/2013    Procedure: DILATATION AND CURETTAGE /HYSTEROSCOPY;  Surgeon: Azalia Bilis, MD;  Location: [facility];  Service: Gynecology;  Laterality: N/A;  . Intrauterine device (iud) insertion  09/13/2013    Current Outpatient Prescriptions  Medication Sig Dispense Refill  . gemfibrozil (LOPID) 600 MG tablet TAKE 1 TABLET BY MOUTH TWICE DAILY  60 tablet  5  . ibuprofen (ADVIL,MOTRIN) 200 MG tablet Take 200 mg by mouth every 6 (six) hours as needed for headache.      . Insulin Glargine (LANTUS SOLOSTAR) 100 UNIT/ML Solostar Pen Inject 25 Units into the skin daily at 10 pm.  5 pen  11  . Insulin Pen Needle 31G X 5 MM MISC 1 each by Does not  apply route daily.  100 each  3  . lisinopril (ZESTRIL) 2.5 MG tablet Take 1 tablet (2.5 mg total) by mouth daily.  30 tablet  5  . sitaGLIPtin-metformin (JANUMET) 50-1000 MG per tablet Take 1 tablet by mouth 2 (two) times daily with a meal.  60 tablet  5  . triamcinolone ointment (KENALOG) 0.5 % Apply 1 application topically 2 (two) times daily.  15 g  0   No current facility-administered medications for this visit.     ALLERGIES: Review of patient's allergies indicates no known allergies.  No family history on file.  History   Social History  . Marital Status: Married    Spouse Name: N/A    Number of Children: N/A  . Years of Education: N/A   Occupational History  . sales    Social History Main Topics  . Smoking status: Never Smoker   . Smokeless tobacco: Never Used  . Alcohol Use: No  . Drug Use: No  . Sexual Activity: Yes    Partners: Female    Birth Control/ Protection: Surgical   Other Topics Concern  .  Not on file   Social History Narrative   Exercise walking 4 days per week for 30 minutes. Married. Education : Other    ROS:  Pertinent items are noted in HPI.  PHYSICAL EXAMINATION:    BP 110/76  Pulse 80  Resp 16  Ht 5' 3.5" (1.613 m)  Wt 173 lb (78.472 kg)  BMI 30.16 kg/m2     General appearance: alert, cooperative and appears stated age   Pelvic: External genitalia:  no lesions              Urethra:  normal appearing urethra with no masses, tenderness or lesions              Bartholins and Skenes: normal                 Vagina: normal appearing vagina with normal color and discharge, no lesions              Cervix: normal appearance.  No IUD strings seen.                    Bimanual Exam:  Uterus:  uterus is normal size, shape, consistency and nontender                                      Adnexa: normal adnexa in size, nontender and no masses                              Wet prep - pH 4.5, positive yeast, negative clue cells and negative  trichomonas.   ASSESSMENT  Mirena IUD patient.  IUD not located on pelvic exam.  Yeast vaginitis. History of BTL.   PLAN  Diflucan and Mycolog II cream.  See orders.  Return for pelvic ultrasound to locate IUD.   An After Visit Summary was printed and given to the patient.  _15_____ minutes face to face time of which over 50% was spent in counseling.

## 2014-03-17 NOTE — Patient Instructions (Signed)
We will call you to schedule the ultrasound appointment after we have approval from your insurance company.

## 2014-03-20 ENCOUNTER — Telehealth: Payer: Self-pay | Admitting: Obstetrics and Gynecology

## 2014-03-20 NOTE — Telephone Encounter (Signed)
Left message for patient to call back. Need to go over benefits and schedule PUS. Pr $0

## 2014-03-20 NOTE — Telephone Encounter (Signed)
Patients son [name] called back. There is a release on file to talk with him. Advised that per benefit quote received, PUS will be covered at 100%.  Scheduled PUS. Advised of 72 hour cancellation policy and $460 cancellation fee. Patient agreeable.

## 2014-03-20 NOTE — Telephone Encounter (Signed)
Spoke with Mr. Alfonse Spruce, patient's son. He states that patient has been unable to pick up oral Diflucan 150 mg PO at their [facility] as they are out of tablets and not sure when they will be getting more.    Called and tansferred rx to [facility] on [address] and [address] at [phone].  Notified Mr. Alfonse Spruce and he will pick up rx.   Routing to provider for final review. Patient agreeable to disposition. Will close encounter

## 2014-03-27 ENCOUNTER — Ambulatory Visit (INDEPENDENT_AMBULATORY_CARE_PROVIDER_SITE_OTHER): Payer: BC Managed Care – PPO

## 2014-03-27 ENCOUNTER — Ambulatory Visit (HOSPITAL_COMMUNITY)
Admission: RE | Admit: 2014-03-27 | Discharge: 2014-03-27 | Disposition: A | Discharge status: Home / Self Care | Payer: BC Managed Care – PPO | Source: Ambulatory Visit | Attending: Obstetrics and Gynecology | Admitting: Obstetrics and Gynecology

## 2014-03-27 ENCOUNTER — Ambulatory Visit (INDEPENDENT_AMBULATORY_CARE_PROVIDER_SITE_OTHER): Payer: BC Managed Care – PPO | Admitting: Obstetrics and Gynecology

## 2014-03-27 ENCOUNTER — Encounter (INDEPENDENT_AMBULATORY_CARE_PROVIDER_SITE_OTHER): Payer: Self-pay

## 2014-03-27 ENCOUNTER — Encounter: Payer: Self-pay | Admitting: Obstetrics and Gynecology

## 2014-03-27 VITALS — BP 110/78 | HR 80 | Ht 63.5 in | Wt 171.0 lb

## 2014-03-27 DIAGNOSIS — Z30431 Encounter for routine checking of intrauterine contraceptive device: Secondary | ICD-10-CM

## 2014-03-27 NOTE — Progress Notes (Signed)
  Subjective  Patient is here for pelvic ultrasound to locate IUD.  Patient brings her daughter in law to her visit with her.   IUD strings not seen on recent office visit.  A couple of weeks ago passed something into the toilet that was yellow and long.  Last month her menses were normal.  Mirena IUD place 09/13/13.  Status post hysteroscopic polypectomy 08/07/13.  Benign polyp and progesterone effect noted on final pathology.   Objective  See ultrasound below - images and report reviewed with patient and her daughter in law.   Uterus - no IUD located.  2 small fibroids - 1.1 and 1.0 cm, EMS 10.70 mm. 15 mm right hemorrhagic CL.  Normal ovaries.  No free fluid.     Assessment  Lost IUD.  Status post BTL.   Plan  Proceed with abdominal films. Understands that the IUD would need to be removed surgically if it is inside her peritoneal cavity.   15 minutes face to face time of which over 50% was spent in counseling.   After visit summary to patient.

## 2014-03-27 NOTE — Progress Notes (Signed)
Order placed in EPIC. Abdominal xray 2 view pa and lat. Patient will go with daughter to [facility] for walk in xray.    Patient with hx of tubal ligation.

## 2014-04-01 ENCOUNTER — Telehealth: Payer: Self-pay | Admitting: Emergency Medicine

## 2014-04-01 NOTE — Telephone Encounter (Signed)
Message copied by Michele Mcalpine on Tue Apr 01, 2014 10:22 AM ------      Message from: [place], BROOK E      Created: Thu Mar 27, 2014  8:03 PM       Please inform patient that the IUD is not inside the abdominal cavity.      I reviewed the x-ray also.      I believe that she did expel it from the vagina.       No further testing is needed.       No surgery is needed.       Please have her call if her menses become heavy again. ------

## 2014-04-01 NOTE — Telephone Encounter (Signed)
Spoke with Mr. Shelley Escobar, patient's emergency contact. Okay per designated party release form. He will give information to patient.  He will call back if Ms. Bottoms has any further bleeding or any concerns.   Routing to provider for final review. Patient agreeable to disposition. Will close encounter

## 2014-04-21 ENCOUNTER — Encounter: Payer: Self-pay | Admitting: Obstetrics and Gynecology

## 2014-05-07 ENCOUNTER — Ambulatory Visit (INDEPENDENT_AMBULATORY_CARE_PROVIDER_SITE_OTHER): Payer: BC Managed Care – PPO

## 2014-05-07 ENCOUNTER — Ambulatory Visit (INDEPENDENT_AMBULATORY_CARE_PROVIDER_SITE_OTHER): Payer: BC Managed Care – PPO | Admitting: Family Medicine

## 2014-05-07 ENCOUNTER — Encounter: Payer: Self-pay | Admitting: Family Medicine

## 2014-05-07 VITALS — BP 100/60 | HR 74 | Temp 98.3°F | Resp 16 | Ht 63.75 in | Wt 170.0 lb

## 2014-05-07 DIAGNOSIS — M79602 Pain in left arm: Secondary | ICD-10-CM

## 2014-05-07 DIAGNOSIS — E119 Type 2 diabetes mellitus without complications: Secondary | ICD-10-CM

## 2014-05-07 DIAGNOSIS — Z23 Encounter for immunization: Secondary | ICD-10-CM

## 2014-05-07 LAB — GLUCOSE, POCT (MANUAL RESULT ENTRY): POC GLUCOSE: 241 mg/dL — AB (ref 70–99)

## 2014-05-07 LAB — POCT GLYCOSYLATED HEMOGLOBIN (HGB A1C): Hemoglobin A1C: 10.8

## 2014-05-07 MED ORDER — SITAGLIPTIN PHOS-METFORMIN HCL 50-1000 MG PO TABS: 1.0000 | ORAL_TABLET | Freq: Two times a day (BID) | ORAL | Status: DC

## 2014-05-07 MED ORDER — INSULIN GLARGINE 100 UNIT/ML SOLOSTAR PEN
30.0000 [IU] | PEN_INJECTOR | Freq: Every day | SUBCUTANEOUS | Status: DC
Start: 2014-05-07 — End: 2014-08-06

## 2014-05-07 MED ORDER — DICLOFENAC SODIUM 75 MG PO TBEC: 75.0000 mg | DELAYED_RELEASE_TABLET | Freq: Two times a day (BID) | ORAL | Status: DC

## 2014-05-07 NOTE — Patient Instructions (Signed)
Tendinitis ° Tendinitis is redness, soreness, and puffiness (inflammation) of the tendons. Tendons are band-like tissues that connect muscle to bone. Tendinitis often happens in the shoulders, heels, or elbows. It might happen if your job involves doing the same motions over and over. °HOME CARE °· Use a sling or splint as told by your doctor. °· Put ice on the injured area. °¨ Put ice in a plastic bag. °¨ Place a towel between your skin and the bag. °¨ Leave the ice on for 15-20 minutes, 03-04 times a day. °· Avoid using your injured arm or leg until the pain goes away. °· Do gentle exercises only as told by your doctor. Stop exercises if the pain gets worse, unless your doctor tells you otherwise. °· Only take medicines as told by your doctor. °GET HELP RIGHT AWAY IF: °· Your pain and puffiness get worse. °· You have new problems, such as loss of feeling (numbness) in the hands. °MAKE SURE YOU: °· Understand these instructions. °· Will watch your condition. °· Will get help right away if you are not doing well or get worse. °Document Released: 09/16/2010 Document Revised: 08/29/2011 Document Reviewed: 09/16/2010 °ExitCare® Patient Information ©2015 [employer]. This information is not intended to replace advice given to you by your health care provider. Make sure you discuss any questions you have with your health care provider. ° °

## 2014-05-07 NOTE — Progress Notes (Signed)
Subjective:    Patient ID: Shelley Escobar, female    DOB: [birth year], 43 y.o.   MRN: [id number]  HPI  This 43 y.o. Asian female is accompanied by her son, [name], who interprets for her. She is here for Type II DM follow-up; no FSBS to report (strips to expensive for One Touch Glucometer).  She is compliant w/ medications w/o adverse effects or hypoglycemia. Pt denies diaphoresis, fatigue, CP or tightness, palpitations, SOB or DOE , [place], dizziness or numbness in hands or feet.  Pt c/o L upper arm and elbow pain x 2 months. She is L handed and denies acute trauma. Pt denies shoulder, neck or chest pain. There is no numbness or weakness.  Ibuprofen 200 mg minimally effective; topical analgesic helps temporarily. Pain feels like it is "in the bone". Pain does not prevent pt from performing ADLs or job-related duties.   Patient Active Problem List   Diagnosis Date Noted  . Obesity (BMI 30.0-34.9) 01/27/2012  . DM (diabetes mellitus), type 2 07/02/2011  . Dyslipidemia 07/02/2011    Prior to Admission medications   Medication Sig Start Date End Date Taking? Authorizing Provider  gemfibrozil (LOPID) 600 MG tablet TAKE 1 TABLET BY MOUTH TWICE DAILY 01/29/14  Yes Barton Fanny, MD  Insulin Glargine (LANTUS SOLOSTAR) 100 UNIT/ML Solostar Pen Inject 25 Units into the skin daily at 10 pm.   Yes Barton Fanny, MD  Insulin Pen Needle 31G X 5 MM MISC 1 each by Does not apply route daily. 09/19/13  Yes Ryan M Dunn, PA-C  sitaGLIPtin-metformin (JANUMET) 50-1000 MG per tablet Take 1 tablet by mouth 2 (two) times daily with a meal. 11/07/13  Yes Barton Fanny, MD  ibuprofen (ADVIL,MOTRIN) 200 MG tablet Take 200 mg by mouth every 6 (six) hours as needed for headache.    Historical Provider, MD  nystatin-triamcinolone (MYCOLOG II) cream Apply 1 application topically 2 (two) times daily. Apply to affected area BID for up to 7 days. 03/17/14   [place], MD  triamcinolone  ointment (KENALOG) 0.5 % Apply 1 application topically 2 (two) times daily. 08/16/13   Azalia Bilis, MD    History   Social History  . Marital Status: Married    Spouse Name: N/A    Number of Children: N/A  . Years of Education: N/A   Occupational History  . sales    Social History Main Topics  . Smoking status: Never Smoker   . Smokeless tobacco: Never Used  . Alcohol Use: No  . Drug Use: No  . Sexual Activity:    Partners: Female, Female    Birth Control/ Protection: Surgical     Comment: Tubal   Other Topics Concern  . Not on file   Social History Narrative   Exercise walking 4 days per week for 30 minutes. Married. Education : Other    Review of Systems  Constitutional: Negative.   HENT: Negative.   Eyes: Positive for visual disturbance.  Respiratory: Negative for cough, chest tightness, shortness of breath and wheezing.   Cardiovascular: Positive for palpitations. Negative for chest pain.  Gastrointestinal: Negative.   Endocrine: Negative.   Musculoskeletal: Positive for arthralgias. Negative for myalgias and joint swelling.      Objective:   Physical Exam  Constitutional: She is oriented to person, place, and time. She appears well-developed and well-nourished. No distress.  HENT:  Head: Normocephalic and atraumatic.  Right Ear: External ear normal.  Left Ear: External ear normal.  Nose: Nose normal.  Mouth/Throat: Oropharynx is clear and moist.  Eyes: Conjunctivae and EOM are normal. Pupils are equal, round, and reactive to light. No scleral icterus.  Neck: Normal range of motion. Neck supple. No thyromegaly present.  Cardiovascular: Normal rate and regular rhythm.   Pulmonary/Chest: Effort normal. No respiratory distress.  Musculoskeletal:       Right shoulder: Normal.       Left shoulder: Normal.       Right elbow: Normal.      Left elbow: She exhibits normal range of motion, no effusion and no deformity. Tenderness found. Lateral epicondyle  tenderness noted. No medial epicondyle and no olecranon process tenderness noted.       Right wrist: Normal.       Cervical back: Normal.       Thoracic back: Normal.       Right upper arm: She exhibits tenderness. She exhibits no bony tenderness, no swelling and no deformity.       Right hand: Normal.  Neurological: [name], place, and time. No cranial nerve deficit. She exhibits normal muscle tone. Coordination normal.  See DM FOOT Exam.  Skin: Skin is warm and dry. She is not diaphoretic. No erythema.  Psychiatric: She has a normal mood and affect. Her behavior is normal. Judgment and thought content normal.  Nursing note and vitals reviewed.   [facility] reading (PRIMARY) by  Dr. Leward Quan: L humerus Alden Hipp- Normal long bones w/o fractures or abnormal bony lesions. Elbow joint appears normal w/o dislocation or degenerative changes.     Assessment & Plan:  Diabetes mellitus without complication - Increase Insulin to 30 units daily. Pt will need to purchase a new glucometer kit next year to get a less expensive one (Readlyn is least expensive according to son). Reminded son to take mother for vision eval/ DM eye exam; order placed for [facility] referral. Plan: HM Diabetes Foot Exam, POCT glucose (manual entry), POCT glycosylated hemoglobin (Hb A1C), Ambulatory referral to Ophthalmology  Pain of left arm - Continue topical analgesic and moist heat.as well as elbow brace.  RX: Diclofenac 75 mg  1 tablet twice a day with meal. Plan: DG Humerus Left, DG Elbow 2 Views Left  Flu vaccine need - Plan: Flu Vaccine QUAD 36+ mos IM   Meds ordered this encounter  Medications  . sitaGLIPtin-metformin (JANUMET) 50-1000 MG per tablet    Sig: Take 1 tablet by mouth 2 (two) times daily with a meal.    Dispense:  60 tablet    Refill:  5  . Insulin Glargine (LANTUS SOLOSTAR) 100 UNIT/ML Solostar Pen    Sig: Inject 30 Units into the skin daily at 10 pm.    Dispense:  5 pen     Refill:  11  . diclofenac (VOLTAREN) 75 MG EC tablet    Sig: Take 1 tablet (75 mg total) by mouth 2 (two) times daily.    Dispense:  60 tablet    Refill:  3

## 2014-05-09 ENCOUNTER — Telehealth: Payer: Self-pay | Admitting: Certified Nurse Midwife

## 2014-05-09 NOTE — Telephone Encounter (Signed)
I called patient to reschedule her upcoming aex appointment with Dr.Lathrop. I spoke with patient's son "[name]" he said her IUD had fallen out a while ago and was told to expect unusual bleeding. He said she did not have any bleeding until recently. He is asking to talk with a nurse. I did reschedule her aex to Evalee Mutton. (DPR: on file to talk with son)

## 2014-05-09 NOTE — Telephone Encounter (Signed)
Spoke with patient's son [name] (okay per ROI). States that patient had her LMP 2 weeks ago. Today she woke up and had some light spotting. States that spotting has stopped now. Denies any heavy bleeding or cramping. Patient had IUD which fell out last month. Patient was seen with Dr.Silva for ultrasound and sent for abdominal xray which verified the IUD was expelled and not in the abdominal cavity. Advised light spotting is okay after not having the IUD. Advised body is adjusting. Advised if bleeding continues to be irregular or begins to get heavy needs to call to be seen. Son is agreeable and will tell his mother.  Dr.Silva anything further for this patient?

## 2014-05-09 NOTE — Telephone Encounter (Signed)
I agree with observational management of her menstruation at this point.  Patient has had a tubal ligation.

## 2014-05-19 ENCOUNTER — Ambulatory Visit: Payer: BC Managed Care – PPO | Admitting: Gynecology

## 2014-05-27 ENCOUNTER — Telehealth: Payer: Self-pay

## 2014-05-27 MED ORDER — NYSTATIN-TRIAMCINOLONE 100000-0.1 UNIT/GM-% EX CREA: 1.0000 "application " | TOPICAL_CREAM | Freq: Two times a day (BID) | CUTANEOUS | Status: DC

## 2014-05-27 NOTE — Telephone Encounter (Signed)
Patient is requesting a refill on her ointment she uses for irritation and itching on the outside of vagina. Patient is requesting it to be sent to [facility] at [address] and [address]. I advise patient she may be required to come in. Per patient the provider usually calls this in when she needs it. Patients call back number is [phone]

## 2014-05-27 NOTE — Telephone Encounter (Signed)
Nystantin, triamcinolone was given by the Gynecologist, do you want to give, or have her contact the gynecologist? pended

## 2014-05-27 NOTE — Telephone Encounter (Signed)
I authorized topical cream for pt. She can pick it up Wednesday at her pharmacy.

## 2014-05-28 ENCOUNTER — Telehealth: Payer: Self-pay

## 2014-05-28 NOTE — Telephone Encounter (Signed)
Called patient to advise, voice mail box not set up.

## 2014-05-28 NOTE — Telephone Encounter (Signed)
Patient son return call regarding his mom. I informed him our office was trying to reach him regarding medication refill. I let him know Dr. Leward Quan authorized "Nystatin-Triamcinolone cream"  and it was sent to [facility] on [address].

## 2014-06-26 ENCOUNTER — Other Ambulatory Visit: Payer: Self-pay

## 2014-06-26 MED ORDER — SITAGLIPTIN PHOS-METFORMIN HCL 50-1000 MG PO TABS: 1.0000 | ORAL_TABLET | Freq: Two times a day (BID) | ORAL | Status: DC

## 2014-06-26 NOTE — Telephone Encounter (Signed)
Pt's sun called to request refill on, sitaGLIPtin-metformin (JANUMET) 50-1000 MG per tablet [[id number]] states that pharmacy faxed over request 3 days ago, and has not heard back. I advised Nguyen,[name] that I would put a message in, and for future refills ask the pharmacy to electronically send request in. Please advise once processed.

## 2014-06-26 NOTE — Telephone Encounter (Signed)
Pt son states that the pharmacy does not have the refills on file. I have resent script. Pt notified.

## 2014-06-26 NOTE — Telephone Encounter (Signed)
This medication was prescribed in Nov 2015  #60 w/ 5 refills. Pt should have refills.

## 2014-06-27 ENCOUNTER — Telehealth: Payer: Self-pay

## 2014-06-27 NOTE — Telephone Encounter (Signed)
[facility] on [address] is needing authorization on new insurance before refilling rx. Please advise

## 2014-06-28 NOTE — Telephone Encounter (Signed)
The patient's son called to clarify about the authorization that the pharmacy said they required for the medication.  He said that the pharmacy will not pay for the sitaGLIPtin-metformin (JANUMET) 50-1000 MG per tablet.  The insurance requires a lower-cost alternative.  He also said that his mother has not taken her medication for the past five days, so he is concerned that it may be an issue for her health if it is not filled soon.  Please advise.  Thank you.  CB#: [id number]

## 2014-06-30 NOTE — Telephone Encounter (Signed)
PA completed for Janumet and asked for expedited review. Notified son of status. Pending.

## 2014-07-03 ENCOUNTER — Telehealth: Payer: Self-pay | Admitting: Certified Nurse Midwife

## 2014-07-03 ENCOUNTER — Ambulatory Visit: Payer: BC Managed Care – PPO | Admitting: Certified Nurse Midwife

## 2014-07-03 NOTE — Telephone Encounter (Signed)
Pt cancelled appointment today because her son who brings her to appointments had back surgery and not able to drive. Will call back to reschedule.

## 2014-07-08 MED ORDER — LINAGLIPTIN-METFORMIN HCL 2.5-1000 MG PO TABS: 1.0000 | ORAL_TABLET | Freq: Two times a day (BID) | ORAL | Status: DC

## 2014-07-08 NOTE — Telephone Encounter (Addendum)
I will try prescribing Jentadueto. It has been sent to the pt's pharmacy. Please notify son. Thank you.

## 2014-07-08 NOTE — Addendum Note (Signed)
Addended by: Ellsworth Lennox B on: 07/08/2014 06:22 PM   Modules accepted: Orders, Medications

## 2014-07-08 NOTE — Telephone Encounter (Signed)
Received denial for coverage of Janumet. I did not receive a list of covered alternatives, but probably prefer Kombiglyze and/or Jentadueto. Do you want to try Rxing one of these?

## 2014-07-09 NOTE — Telephone Encounter (Signed)
Notified son of new Rx and advised to keep a log of BS between now and appt so that Dr Leward Quan can see how it is working for pt. He agreed.

## 2014-07-14 NOTE — Telephone Encounter (Signed)
Dr Leward Quan, Eugene Gavia is also not covered. Do you want to try the Kombiglyze? I tried to get through to [facility] to see if I could get them to tell me the preferred alternative, but they wouldn't even let me hold - a recording came on saying they have a high call volume and to try again later.

## 2014-07-15 MED ORDER — SAXAGLIPTIN-METFORMIN ER 2.5-1000 MG PO TB24: 1.0000 | ORAL_TABLET | Freq: Two times a day (BID) | ORAL | Status: DC

## 2014-07-15 NOTE — Telephone Encounter (Signed)
I changed medication to Kombiglyze (generic) 1 tablet twice daily after meals. If this med is not covered, I will have to discuss increasing Insulin dose and making other medication changes w/ pt.  Thank you.

## 2014-07-15 NOTE — Addendum Note (Signed)
Addended by: Ellsworth Lennox B on: 07/15/2014 01:47 PM   Modules accepted: Orders, Medications

## 2014-07-15 NOTE — Telephone Encounter (Signed)
Called pharm and was advised that Kombiglyze is covered. Called son back and explained new Rx. He agreed to p/up and let us know if there are any problems with it. Dr Leward Quan, Juluis Rainier

## 2014-08-06 ENCOUNTER — Ambulatory Visit (INDEPENDENT_AMBULATORY_CARE_PROVIDER_SITE_OTHER): Payer: BLUE CROSS/BLUE SHIELD | Admitting: Family Medicine

## 2014-08-06 ENCOUNTER — Encounter: Payer: Self-pay | Admitting: Family Medicine

## 2014-08-06 VITALS — BP 122/81 | HR 82 | Temp 98.4°F | Resp 16 | Ht 64.0 in | Wt 169.2 lb

## 2014-08-06 DIAGNOSIS — N898 Other specified noninflammatory disorders of vagina: Secondary | ICD-10-CM

## 2014-08-06 DIAGNOSIS — T50905A Adverse effect of unspecified drugs, medicaments and biological substances, initial encounter: Secondary | ICD-10-CM

## 2014-08-06 DIAGNOSIS — L298 Other pruritus: Secondary | ICD-10-CM

## 2014-08-06 DIAGNOSIS — E119 Type 2 diabetes mellitus without complications: Secondary | ICD-10-CM

## 2014-08-06 DIAGNOSIS — R300 Dysuria: Secondary | ICD-10-CM

## 2014-08-06 DIAGNOSIS — T887XXA Unspecified adverse effect of drug or medicament, initial encounter: Secondary | ICD-10-CM

## 2014-08-06 LAB — POCT GLYCOSYLATED HEMOGLOBIN (HGB A1C): HEMOGLOBIN A1C: 10.3

## 2014-08-06 LAB — POCT URINALYSIS DIPSTICK
Bilirubin, UA: NEGATIVE
Blood, UA: NEGATIVE
Glucose, UA: 1000
KETONES UA: NEGATIVE
Leukocytes, UA: NEGATIVE
Nitrite, UA: NEGATIVE
Protein, UA: 100
Spec Grav, UA: 1.02
Urobilinogen, UA: 0.2
pH, UA: 5

## 2014-08-06 LAB — POCT UA - MICROSCOPIC ONLY
Casts, Ur, LPF, POC: NEGATIVE
Crystals, Ur, HPF, POC: NEGATIVE
Mucus, UA: POSITIVE
Yeast, UA: NEGATIVE

## 2014-08-06 MED ORDER — INSULIN GLARGINE 100 UNIT/ML SOLOSTAR PEN: 36.0000 [IU] | PEN_INJECTOR | Freq: Every day | SUBCUTANEOUS | Status: DC

## 2014-08-06 MED ORDER — NYSTATIN-TRIAMCINOLONE 100000-0.1 UNIT/GM-% EX CREA: 1.0000 "application " | TOPICAL_CREAM | Freq: Two times a day (BID) | CUTANEOUS | Status: DC

## 2014-08-06 NOTE — Patient Instructions (Signed)
Your Diabetes is not well controlled. Continue taking the oral medication once a day. Insulin dose has to be increased to 36 units.  I will send your urine for culture to check for infection. If there is no infection, you will need to return for female exam.  Try to drink more water; getting the Diabetes under control will help reduce the burning also.

## 2014-08-06 NOTE — Progress Notes (Signed)
S;  This 44 y.o. Female has uncontrolled Type II DM; last A1c= 11.8%. She is taking Lantus and a new medication - combination metformin w/ saxagliptin- prescribed last month. Pt reports Fiorello when taking this med twice daily as prescribed. She is taking it once a day and not having Gaffin. She drinks only water; no regular exercise. Not checking FSBS due to cost of strips. Does not report hypoglycemia.  Pt continues to have vaginal itch and needs refills on nystatin- triamcinolone cream. She has dysuria, not associated w/ incontinence or gross hematuria. No fever/chills, flank/ back or pelvic pain. Pt had GYN eval w/ IUD placement last fall. Apparently, Mirena IUD which was placed in March 2015, fell out. This was after pt underwent hysteroscopic polypectomy in Feb 2015. Pt is s/p BTL. The GYN did pelvic US and ABD xray in Oct 2015 to confirm IUD was expelled from uterine cavity.  Patient Active Problem List   Diagnosis Date Noted  . Obesity (BMI 30.0-34.9) 01/27/2012  . DM (diabetes mellitus), type 2 07/02/2011  . Dyslipidemia 07/02/2011    Prior to Admission medications   Medication Sig Start Date End Date Taking? Authorizing Provider  gemfibrozil (LOPID) 600 MG tablet TAKE 1 TABLET BY MOUTH TWICE DAILY 01/29/14  Yes Barton Fanny, MD  Insulin Glargine (LANTUS SOLOSTAR) 100 UNIT/ML Solostar Pen Inject 30 Units into the skin daily at 10 pm.   Yes Barton Fanny, MD  Insulin Pen Needle 31G X 5 MM MISC 1 each by Does not apply route daily. 09/19/13  Yes Ryan M Dunn, PA-C  Saxagliptin-Metformin 2.10-998 MG TB24 Take 1 tablet by mouth 2 (two) times daily after a meal. 07/15/14  Yes Barton Fanny, MD  diclofenac (VOLTAREN) 75 MG EC tablet Take 1 tablet (75 mg total) by mouth 2 (two) times daily. Patient not taking: Reported on 08/06/2014 05/07/14   Barton Fanny, MD  ibuprofen (ADVIL,MOTRIN) 200 MG tablet Take 200 mg by mouth every 6 (six) hours as needed for headache.    Historical  Provider, MD  lisinopril (ZESTRIL) 2.5 MG tablet Take 1 tablet (2.5 mg total) by mouth daily. Patient not taking: Reported on 08/06/2014 08/14/13   Barton Fanny, MD  nystatin-triamcinolone [facility] II) cream Apply 1 application topically 2 (two) times daily. Apply to affected area BID for up to 7 days.    Barton Fanny, MD    Past Surgical History  Procedure Laterality Date  . Tubal ligation      Age 3  . Hysteroscopy w/d&c N/A 08/07/2013    Procedure: DILATATION AND CURETTAGE /HYSTEROSCOPY;  Surgeon: Azalia Bilis, MD;  Location: [facility];  Service: Gynecology;  Laterality: N/A;  . Intrauterine device (iud) insertion (IUD fell out in Oct 2015)  09/13/2013    SOC and FAM Hx reviewed.  ROS: AS per HPI. Negative for abnormal weight loss, diaphoresis, fatigue, vision disturbances, CP or tightness, SOB, edema, n/v/d, myalgias, dizziness, weakness, or numbness.   O: Filed Vitals:   08/06/14 1021  BP: 122/81  Pulse: 82  Temp: 98.4 F (36.9 C)  Resp: 16   GEN: In NAD: WN,WD. HENT: [place]/AT; EOMI w/ clear conj/sclerae. Otherwise unremarkable. COR: RRR. No edema. LUNGS: Normal resp rate and effort. SKIN: W&D; intact w/o erythema, jaundice or pallor. NEURO: A&O x 3; CNs intact. Nonfocal.  Results for orders placed or performed in visit on 08/06/14  POCT glycosylated hemoglobin (Hb A1C)  Result Value Ref Range   Hemoglobin A1C 10.3  POCT UA - Microscopic Only  Result Value Ref Range   WBC, Ur, HPF, POC 3-4    RBC, urine, microscopic 9-12    Bacteria, U Microscopic 2+    Mucus, UA pos    Epithelial cells, urine per micros 3-4    Crystals, Ur, HPF, POC neg    Casts, Ur, LPF, POC neg    Yeast, UA neg   POCT urinalysis dipstick  Result Value Ref Range   Color, UA yellow    Clarity, UA slightly    Glucose, UA 1000    Bilirubin, UA neg    Ketones, UA neg    Spec Grav, UA 1.020    Blood, UA neg    pH, UA 5.0    Protein, UA 100    Urobilinogen, UA 0.2    Nitrite, UA  neg    Leukocytes, UA Negative      A/P: DM w/o complication type II - Increase Lantus to 36 units. Encouraged FSBS but pt not likely to comply. Continue taking saxagliptin- metformin 2.5- 1000 mg 1 tablet daily w/ a meal. Need documentation of Ophthalmology evaluation; pt was referred to Dr. Katy Fitch in Nov 2015. Plan: HM Diabetes Foot Exam, POCT glycosylated hemoglobin (Hb A1C)  Medication side effect, initial encounter- Will continue saxagliptin- metformin as noted above.  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  Meds ordered this encounter  Medications  . nystatin-triamcinolone (MYCOLOG II) cream    Sig: Apply 1 application topically 2 (two) times daily. Apply to affected area BID for up to 7 days.    Dispense:  60 g    Refill:  0  . Insulin Glargine (LANTUS SOLOSTAR) 100 UNIT/ML Solostar Pen    Sig: Inject 36 Units into the skin daily at 10 pm.    Dispense:  5 pen    Refill:  11

## 2014-08-07 ENCOUNTER — Other Ambulatory Visit: Payer: Self-pay | Admitting: Family Medicine

## 2014-08-07 LAB — URINE CULTURE

## 2014-08-08 NOTE — Telephone Encounter (Signed)
Dr Leward Quan, you just saw pt this week, but I don't see this med discussed or lipid lab recently. Do you want to RF?

## 2014-08-09 NOTE — Progress Notes (Signed)
Quick Note:  Please advise pt regarding following labs... The urine culture does not show one particular bacteria; there is no indication to treat for infection. ______

## 2014-09-16 ENCOUNTER — Other Ambulatory Visit: Payer: Self-pay | Admitting: Family Medicine

## 2014-09-16 NOTE — Telephone Encounter (Signed)
Do you want to give RFs on this or pt RTC?

## 2014-09-20 ENCOUNTER — Telehealth: Payer: Self-pay

## 2014-09-20 NOTE — Telephone Encounter (Signed)
lmom to cb. 

## 2014-09-20 NOTE — Telephone Encounter (Signed)
lmom to RTC.

## 2014-09-20 NOTE — Telephone Encounter (Signed)
Spoke with pt's son--pt still has vaginal itching. This has been going on since Tuesday. Please advise.

## 2014-09-20 NOTE — Telephone Encounter (Signed)
Patient's son called about his mother's condition.  He said her rash is gone, but she is still experiencing vaginal itching.  He wants to know if another medication can be called in, or if she needs to be seen again.  Please advise.  Thank you.  CB#: [phone]

## 2014-09-20 NOTE — Telephone Encounter (Signed)
Please advise patient to RTC for re-evaluation.

## 2014-09-21 ENCOUNTER — Ambulatory Visit (INDEPENDENT_AMBULATORY_CARE_PROVIDER_SITE_OTHER): Payer: BLUE CROSS/BLUE SHIELD | Admitting: Family Medicine

## 2014-09-21 VITALS — BP 120/74 | HR 84 | Temp 97.8°F | Resp 20 | Ht 63.5 in | Wt 172.4 lb

## 2014-09-21 DIAGNOSIS — B353 Tinea pedis: Secondary | ICD-10-CM | POA: Diagnosis not present

## 2014-09-21 DIAGNOSIS — L298 Other pruritus: Secondary | ICD-10-CM

## 2014-09-21 DIAGNOSIS — N898 Other specified noninflammatory disorders of vagina: Secondary | ICD-10-CM

## 2014-09-21 DIAGNOSIS — B3731 Acute candidiasis of vulva and vagina: Secondary | ICD-10-CM

## 2014-09-21 DIAGNOSIS — B373 Candidiasis of vulva and vagina: Secondary | ICD-10-CM

## 2014-09-21 LAB — POCT WET PREP WITH KOH
KOH PREP POC: POSITIVE
TRICHOMONAS UA: NEGATIVE
Yeast Wet Prep HPF POC: POSITIVE

## 2014-09-21 MED ORDER — FLUCONAZOLE 150 MG PO TABS: 150.0000 mg | ORAL_TABLET | ORAL | Status: DC

## 2014-09-21 NOTE — Patient Instructions (Signed)
Vim m ??o Do N?m Candida (Monilial Vaginitis) Vim m ??o l m?t tnh tr?ng s?ng, ?? v ?au (vim) c?a m ??o v m h?. B?nh vim m ??o do n?m candida khng ph?i l b?nh nhi?m trng ly qua ???ng tnh d?c.  NGUYN NHN Vim m ??o do n?m gy ra b?i n?m men (n?m candida) th??ng th?y trong m ??o. Trong nhi?m trng do n?m men, n?m candida pht tri?n qu m?c v? s? l??ng ??n ?? lm xo tr?n cn b?ng v? m?t [place] h?c. TRI?U CH?NG  Huy?t tr?ng m ??o c m?ng d?y v tr?ng.  S?ng, ng?a, ?? t?y v kch ?ng m ??o v c th? c? cc mi c?a m ??o (m h?).  Ti?u ?au hay rt bu?t.  Giao h?p ?au. CH?N ?ON Nh?ng th? c th? gp ph?n gy ra vim m ??o do n?m candida l:  Cc giai ?o?n h?u mn kinh v tr??c khi l?p gia ?nh.  [place] k?.  Nhi?m trng.  M?t m?i, u? o?i hay c?ng th?ng, ??c bi?t l n?u ? t?ng b? vim m ??o do n?m candida tr??c ?y.  Ti?u ???ng. (?i?u tr? ?n ??nh s? gip h? th?p xc su?t m?c b?nh h?n.)  Dng cc vin thu?c ng?a [place].  M?c qu?n o b st.  S? d?ng s?a t?m nhi?u b?t, thu?c x?t v? sinh ph? n?, th?t r?a m ??o, ho?c b?ng v? sinh d?ng nht vo m ??o c t?m ch?t kh? mi.  ?ang dng m?t s? khng sinh (thu?c di?t vi trng).  Th?nh tho?ng c th? b? ti pht n?u b? ?m. ?I?U TR? Chuyn gia ch?m [place] s?c kh?e s? c?p thu?c cho b?nh nhn.  C m?t vi lo?i kem bi m ??o khng n?m candida v cc thu?c ??n ??t m ??o ??c hi?u dng ?? ?i?u tr? vim m ??o do n?m candida.  C?ng c th? dng kem khng n?m candida hay kem ch?i?u tr? ng?a hay kch ?ng m h?. Nn c s? cho php c?a chuyn gia ch?m [place] s?c kh?e.  Bi ln m ??o dung d?ch xanh methylene c th? gip ch n?u kem khng n?m candida khng c tc d?ng.  ?n s?a chua c th? gip phng ng?a vim m ??o do n?m candida. H??NG D?N CH?M [place] T?I NH  U?ng t?t c? cc thu?c ? ???c k ??n.  Khng quan h? tnh d?c cho ??n khi hon t?t vi?c ?i?u tr? hay khi ???c chuyn gia ch?m [place] s?c kh?e h??ng d?n.  Ngm r?a vng kn trong  n??c ?m.  Khng ???c th?t r?a m ??o.  Khng dng b?ng v? sinh d?ng nht vo m ??o, ??c bi?t l nh?ng lo?i ??p n??c hoa.  M?c cc lo?i qu?n lt v?i [place].  Trnh m?c qu?n ch?n v t?t li?n qu?n b ch?t.  Hy ni v?i b?n tnh v? vi?c b? nhi?m n?m. Nn ?i khm n?u c nh?ng tri?u ch?ng nh? pht ban d?ng nh? ho?c ng?a.  B?n tnh c?ng nn ???c ?i?u tr? lun n?u tnh tr?ng nhi?m trng kh ch?a d?t.  Th?c hnh tnh d?c an ton h?n - s? d?ng bao cao su.  M?t s? thu?c dng t?i m ??o c th? lm cho ch?t nh?a m? c?a bao cao su b? h?ng. Cc thu?c dng t?i m ??o gy h?i cho bao cao su l:  Kem thoa Cleocin.  Butoconazole (Femstat).  Vin ??t m ??o Terconazole (Terazol).  Miconazole (Monistat) (c th? mua m khng c?n k toa). H?Y ?I  KHM N?U:  Nhi?t ?? ?o ? mi?ng trn 38,9 C (102 F).  Tnh tr?ng nhi?m trng c?a b?n tr? nn t? h?n sau 2 ngy ?i?u tr?.  Tnh tr?ng nhi?m trng c?a b?n khng kh h?n sau 3 ngy ?i?u tr?Marland Kitchen  B?n b? m?n n??c trong ho?c xung quanh m ??o.  B?n b? ch?y mu m ??o, v n khng ph?i l k? kinh nguy?t c?a b?n.  B?n b? ?au khi ?i ti?u.  B?n b? cc v?n ?? v? ???ng ru?t.  B?n b? ?au khi giao h?p. Document Released: 06/06/2005 Document Revised: 02/06/2013 [facility] Patient Information 2015 [place]. This information is not intended to replace advice given to you by your health care provider. Make sure you discuss any questions you have with your health care provider.   Monilial Vaginitis Vaginitis in a soreness, swelling and redness (inflammation) of the vagina and vulva. Monilial vaginitis is not a sexually transmitted infection. CAUSES  Yeast vaginitis is caused by yeast (candida) that is normally found in your vagina. With a yeast infection, the candida has overgrown in number to a point that upsets the chemical balance. SYMPTOMS   White, thick vaginal discharge.  Swelling, itching, redness and irritation of the vagina and possibly the lips  of the vagina (vulva).  Burning or painful urination.  Painful intercourse. DIAGNOSIS  Things that may contribute to monilial vaginitis are:  Postmenopausal and virginal states.  Pregnancy.  Infections.  Being tired, sick or stressed, especially if you had monilial vaginitis in the past.  Diabetes. Good control will help lower the chance.  Birth control pills.  Tight fitting garments.  Using bubble bath, feminine sprays, douches or deodorant tampons.  Taking certain medications that kill germs (antibiotics).  Sporadic recurrence can occur if you become ill. TREATMENT  Your caregiver will give you medication.  There are several kinds of anti monilial vaginal creams and suppositories specific for monilial vaginitis. For recurrent yeast infections, use a suppository or cream in the vagina 2 times a week, or as directed.  Anti-monilial or steroid cream for the itching or irritation of the vulva may also be used. Get your caregiver's permission.  Painting the vagina with methylene blue solution may help if the monilial cream does not work.  Eating yogurt may help prevent monilial vaginitis. HOME CARE INSTRUCTIONS   Finish all medication as prescribed.  Do not have sex until treatment is completed or after your caregiver tells you it is okay.  Take warm sitz baths.  Do not douche.  Do not use tampons, especially scented ones.  Wear cotton underwear.  Avoid tight pants and panty hose.  Tell your sexual partner that you have a yeast infection. They should go to their caregiver if they have symptoms such as mild rash or itching.  Your sexual partner should be treated as well if your infection is difficult to eliminate.  Practice safer sex. Use condoms.  Some vaginal medications cause latex condoms to fail. Vaginal medications that harm condoms are:  Cleocin cream.  Butoconazole (Femstat).  Terconazole (Terazol) vaginal suppository.  Miconazole (Monistat)  (may be purchased over the counter). SEEK MEDICAL CARE IF:   You have a temperature by mouth above 102 F (38.9 C).  The infection is getting worse after 2 days of treatment.  The infection is not getting better after 3 days of treatment.  You develop blisters in or around your vagina.  You develop vaginal bleeding, and it is not your menstrual period.  You have  pain when you urinate.  You develop intestinal problems.  You have pain with sexual intercourse. Document Released: 03/16/2005 Document Revised: 08/29/2011 Document Reviewed: 11/28/2008 [facility] Patient Information 2015 [place], [place]. This information is not intended to replace advice given to you by your health care provider. Make sure you discuss any questions you have with your health care provider.   Athlete's Foot Athlete's foot (tinea pedis) is a fungal infection of the skin on the feet. It often occurs on the skin between the toes or underneath the toes. It can also occur on the soles of the feet. Athlete's foot is more likely to occur in hot, humid weather. Not washing your feet or changing your socks often enough can contribute to athlete's foot. The infection can spread from person to person (contagious). CAUSES Athlete's foot is caused by a fungus. This fungus thrives in warm, moist places. Most people get athlete's foot by sharing shower stalls, towels, and wet floors with an infected person. People with weakened immune systems, including those with diabetes, may be more likely to get athlete's foot. SYMPTOMS   Itchy areas between the toes or on the soles of the feet.  White, flaky, or scaly areas between the toes or on the soles of the feet.  Tiny, intensely itchy blisters between the toes or on the soles of the feet.  Tiny cuts on the skin. These cuts can develop a bacterial infection.  Thick or discolored toenails. DIAGNOSIS  Your caregiver can usually tell what the problem is by doing a physical exam.  Your caregiver may also take a skin sample from the rash area. The skin sample may be examined under a microscope, or it may be tested to see if fungus will grow in the sample. A sample may also be taken from your toenail for testing. TREATMENT  Over-the-counter and prescription medicines can be used to kill the fungus. These medicines are available as powders or creams. Your caregiver can suggest medicines for you. Fungal infections respond slowly to treatment. You may need to continue using your medicine for several weeks. PREVENTION   Do not share towels.  Wear sandals in wet areas, such as shared locker rooms and shared showers.  Keep your feet dry. Wear shoes that allow air to circulate. Wear cotton or wool socks. HOME CARE INSTRUCTIONS   Take medicines as directed by your caregiver. Do not use steroid creams on athlete's foot.  Keep your feet clean and cool. Wash your feet daily and dry them thoroughly, especially between your toes.  Change your socks every day. Wear cotton or wool socks. In hot climates, you may need to change your socks 2 to 3 times per day.  Wear sandals or canvas tennis shoes with good air circulation.  If you have blisters, soak your feet in Burow's solution or Epsom salts for 20 to 30 minutes, 2 times a day to dry out the blisters. Make sure you dry your feet thoroughly afterward. SEEK MEDICAL CARE IF:   You have a fever.  You have swelling, soreness, warmth, or redness in your foot.  You are not getting better after 7 days of treatment.  You are not completely cured after 30 days.  You have any problems caused by your medicines. MAKE SURE YOU:   Understand these instructions.  Will watch your condition.  Will get help right away if you are not doing well or get worse. Document Released: 06/03/2000 Document Revised: 08/29/2011 Document Reviewed: 03/25/2011 [employer] Patient Information 2015 [place]. This information is not intended to  replace advice given to you by your health care provider. Make sure you discuss any questions you have with your health care provider. N?m Bn Chn (Athlete's Foot) B?nh n?m bn chn (n?m da chn) l s? nhi?m trng do n?m trn da bn chn. B?nh th??ng xu?t hi?n trn da ? gi?a cc ngn chn ho?c bn d??i cc ngn chn. B?nh c?ng c th? xu?t hi?n trn lng bn chn. N?m bn chn nhi?u kh? n?ng xu?t hi?n khi th?i ti?t nng, ?m. Vi?c khng r?a chn ho?c thay t?t th??ng xuyn c th? gp ph?n gy b?nh n?m bn chn. Nhi?m trng ny c th? ly t? ng??i sang ng??i (d? ly). NGUYN NHN N?m bn chn gy b?i n?m. Lo?i n?m ny pht tri?n m?nh ? nh?ng n?i ?m, ?m. Ph?n l?n nh?ng ng??i b? nhi?m n?m bn chn do chung bu?ng t?m, kh?n t?m v sn nh ??t v?i ng??i b? b?nh. Nh?ng ng??i c h? mi?n d?ch y?u, bao g?m ng??i b? b?nh ?i ???ng, c th? d? b? n?m bn chn h?n. TRI?U CH?NG  Nh?ng vng ng?a gi?a cc ngn chn ho?c trn lng bn chn.  Nh?ng vng mu tr?ng, d?ng v?y ho?c c v?y gi?a cc ngn chn ho?c trn lng bn chn.  Nh?ng m?n n??c nh?, r?t ng?a gi?a cc ngn chn ho?c trn lng bn chn.  Cc v?t c?t nh? trn da. Nh?ng v?t c?t ny c th? b? nhi?m trng do vi khu?n.  Cc mng chn dy ho?c bi?n mu. CH?N ?ON Chuyn gia ch?m [place] s?c kh?e c?a b?n th??ng c th? xc ??nh v?n ?? b?ng cch khm th?c th?Paulino Rily gia ch?m [place] s?c kh?e c?ng c th? l?y m?u da t? vng pht ban. M?u da c th? ???c ki?m tra d??i knh hi?n vi ho?c c th? ???c xt nghi?m ?? xem n?m c pht tri?n trong m?u da khng. M?u c?ng c th? ???c l?y t? mng chn ?? xt nghi?m. ?I?U TR? Thu?c khng c?n k toa ho?c thu?c c?n k toa c th? ???c s? d?ng ?? di?t n?m. Nh?ng lo?i thu?c ny c s?n ? d?ng b?t ho?c kem. Chuyn gia ch?m [place] s?c kh?e c th? k ??n thu?c cho b?n. Nhi?m n?m ?p ?ng ch?m v?i ?i?u tr?Marland Kitchen B?n c th? c?n ph?i ti?p t?c s? d?ng thu?c trong vi tu?n. PHNG NG?A  Khng dng chung kh?n t?m.  ?i dp ? nh?ng ch? ??t, ch?ng h?n nh?  phng g?i hnh l v phng t?m chung.  Gi? chn kh. ?i giy thong kh. ?i t?t b?ng s?i bng ho?c len. H??NG D?N CH?M [place] T?I NH  S? d?ng thu?c theo ch? d?n c?a chuyn gia ch?m  s?c kh?e. Khng s? d?ng kem steroid ?? ?i?u tr? n?m bn chn.  Gi? chn s?ch v mt. R?a chn hng ngy v lau th?t kh, ??c bi?t l gi?a cc ngn chn.  Thay t?t hng ngy. ?i t?t lm b?ng s?i bng ho?c len. Trong th?i ti?t nng, b?n c th? c?n ph?i thay t?t 2 ??n 3 l?n m?i ngy.  ?i x?ng ?an ho?c giy qu?n v?t b?ng v?i thong kh t?t.  N?u b?n c m?n n??c, hy ngm chn trong dung d?ch mu?i Burow ho?c mu?i [place] t? 20 ??n 30 pht, 2 l?n m?i ngy ?? lm kh nh?ng m?n n??c. Sau ?, ??m b?o lau th?t kh chn. HY ?I KHM N?U:  B?n b? s?t.  B?n b? s?ng, ?au, nng ho?c t?y ?? ?  chn.  B?n c?m th?y khng ?? h?n sau 7 ngy ?i?u tr?Marland Kitchen  B?n khng kh?i hon ton sau 30 ngy.  B?n g?p b?t k? v?n ?? no do thu?c gy ra. ??M B?O B?N:  Hi?u cc h??ng d?n ny.  S? theo di tnh tr?ng c?a mnh.  S? yu c?u tr? gip ngay l?p t?c n?u b?n c?m th?y khng ?? ho?c tnh tr?ng tr?m tr?ng h?n. Document Released: 06/06/2005 Document Revised: 02/06/2013 [facility] Patient Information 2015 [place]. This information is not intended to replace advice given to you by your health care provider. Make sure you discuss any questions you have with your health care provider.

## 2014-09-30 NOTE — Progress Notes (Signed)
Subjective:  This chart was scribed for Shelley Escobar. Shelley Pulse, MD by Shelley Escobar, ED Scribe. This patient was seen in room 13 and the patient's care was started at 4:03 PM.     Patient ID: Shelley Escobar, female    DOB: May 27, 1971, 44 y.o.   MRN: [id number] Chief Complaint  Patient presents with  . Vaginal Itching    Vaginal Itching The patient's primary symptoms include vaginal discharge. Pertinent negatives include no chills, fever, hematuria, rash or sore throat.   PCP: Shelley Lennox, MD HPI Comments: Shelley Escobar Shelley Escobar is a 44 y.o. female, with PMH noted below including DM, who presents to the [facility] complaining of worsening vaginal itching onset a couple of months ago. Pt was started on mycolog II cream while that helped resolve a rash in the vaginal area but did not help alleviate the vaginal itching.    Past Medical History  Diagnosis Date  . Hyperlipidemia   . DM (diabetes mellitus)   . Vitamin D deficiency   . SVD (spontaneous vaginal delivery)     x 3  . Anemia     hx   No Known Allergies   Current outpatient prescriptions:  .  gemfibrozil (LOPID) 600 MG tablet, TAKE 1 TABLET BY MOUTH TWICE DAILY, Disp: 60 tablet, Rfl: 5 .  ibuprofen (ADVIL,MOTRIN) 200 MG tablet, Take 200 mg by mouth every 6 (six) hours as needed for headache., Disp: , Rfl:  .  Insulin Glargine (LANTUS SOLOSTAR) 100 UNIT/ML Solostar Pen, Inject 36 Units into the skin daily at 10 pm., Disp: 5 pen, Rfl: 11 .  Insulin Pen Needle 31G X 5 MM MISC, 1 each by Does not apply route daily., Disp: 100 each, Rfl: 3 .  nystatin-triamcinolone (MYCOLOG II) cream, APPLY TO THE AFFECTED AREA TWICE DAILY UP TO 7 DAYS, Disp: 60 g, Rfl: 3 .  Saxagliptin-Metformin 2.10-998 MG TB24, Take 1 tablet by mouth 2 (two) times daily after a meal., Disp: 60 tablet, Rfl: 5 .  diclofenac (VOLTAREN) 75 MG EC tablet, Take 1 tablet (75 mg total) by mouth 2 (two) times daily. (Patient not taking: Reported on 08/06/2014), Disp:  60 tablet, Rfl: 3 .  fluconazole (DIFLUCAN) 150 MG tablet, Take 1 tablet (150 mg total) by mouth once a week., Disp: 6 tablet, Rfl: 0 .  lisinopril (ZESTRIL) 2.5 MG tablet, Take 1 tablet (2.5 mg total) by mouth daily. (Patient not taking: Reported on 08/06/2014), Disp: 30 tablet, Rfl: 5   Review of Systems  Constitutional: Negative for fever and chills.  HENT: Negative for sore throat and trouble swallowing.   Genitourinary: Positive for vaginal discharge. Negative for hematuria and genital sores.       Vaginal itching   Musculoskeletal: Negative for gait problem.  Skin: Negative for rash.  Psychiatric/Behavioral: Positive for sleep disturbance (due to vaginal itching). Negative for confusion.       Objective:   Physical Exam  Constitutional: She is oriented to person, place, and time. She appears well-developed and well-nourished. No distress.  HENT:  Head: Normocephalic and atraumatic.  Eyes: Conjunctivae and EOM are normal.  Neck: Neck supple.  Cardiovascular: Normal rate.   Pulmonary/Chest: Effort normal. No respiratory distress.  Genitourinary:  Exam performed by Delman Cheadle,  exam chaperoned Date: 09/21/2014 Pelvic exam: external labia minora normal. Internal labia minora with erythema of introitus with curdy white discharge  VULVA: normal appearing vulva with no masses, tenderness or lesion. VAGINA: normal appearing vagina with normal color  and discharge, no lesions. CERVIX: normal appearing cervix without lesions, cervical motion tenderness absent, cervical os closed with out purulent discharge; vaginal discharge - white, creamy and curd-like, Wet prep and DNA probe for chlamydia and GC obtained.   ADNEXA: normal adnexa in size, nontender and no masses UTERUS: uterus is normal size, shape, consistency and nontender.     Musculoskeletal: Normal range of motion.  Neurological: [name], place, and time.  Skin: Skin is warm and dry.  Severe  tiniapedus   Psychiatric: She has a normal mood and affect. Her behavior is normal.  Nursing note and vitals reviewed.    Filed Vitals:   09/21/14 1519  BP: 120/74  Escobar: 84  Temp: 97.8 F (36.6 C)  Resp: 20   Results for orders placed or performed in visit on 09/21/14  POCT Wet Prep with KOH  Result Value Ref Range   Trichomonas, UA Negative    Clue Cells Wet Prep HPF POC 0-2    Epithelial Wet Prep HPF POC 0-3    Yeast Wet Prep HPF POC positive    Bacteria Wet Prep HPF POC 1+    RBC Wet Prep HPF POC 0-2    WBC Wet Prep HPF POC 10-15    KOH Prep POC Positive         Assessment & Plan:  DIAGNOSTIC STUDIES:l Oxygen Saturation is 98% on RA, nl by my interpretation.    COORDINATION OF CARE: 4:06 PM-Discussed treatment plan which includes pelvic exam and meds with pt at bedside and pt agreed to plan.   Vaginal itching - Plan: POCT Wet Prep with KOH  Vaginal moniliasis  Tinea pedis of both feet  Meds ordered this encounter  Medications  . fluconazole (DIFLUCAN) 150 MG tablet    Sig: Take 1 tablet (150 mg total) by mouth once a week.    Dispense:  6 tablet    Refill:  0    I personally performed the services described in this documentation, which was scribed in my presence. The recorded information has been reviewed and considered, and addended by me as needed.  Delman Cheadle, MD MPH

## 2014-10-27 ENCOUNTER — Other Ambulatory Visit: Payer: Self-pay | Admitting: Physician Assistant

## 2014-10-29 ENCOUNTER — Ambulatory Visit (INDEPENDENT_AMBULATORY_CARE_PROVIDER_SITE_OTHER): Payer: BLUE CROSS/BLUE SHIELD | Admitting: Physician Assistant

## 2014-10-29 VITALS — BP 110/70 | HR 82 | Temp 97.9°F | Resp 20 | Ht 63.75 in | Wt 167.1 lb

## 2014-10-29 DIAGNOSIS — R300 Dysuria: Secondary | ICD-10-CM

## 2014-10-29 DIAGNOSIS — R21 Rash and other nonspecific skin eruption: Secondary | ICD-10-CM

## 2014-10-29 DIAGNOSIS — N9089 Other specified noninflammatory disorders of vulva and perineum: Secondary | ICD-10-CM

## 2014-10-29 DIAGNOSIS — E119 Type 2 diabetes mellitus without complications: Secondary | ICD-10-CM | POA: Diagnosis not present

## 2014-10-29 LAB — POCT CBC
GRANULOCYTE PERCENT: 64.5 % (ref 37–80)
HEMATOCRIT: 34.2 % — AB (ref 37.7–47.9)
Hemoglobin: 10.8 g/dL — AB (ref 12.2–16.2)
LYMPH, POC: 2.9 (ref 0.6–3.4)
MCH, POC: 22.8 pg — AB (ref 27–31.2)
MCHC: 31.5 g/dL — AB (ref 31.8–35.4)
MCV: 72.5 fL — AB (ref 80–97)
MID (cbc): 0.6 (ref 0–0.9)
MPV: 9.7 fL (ref 0–99.8)
POC Granulocyte: 6.5 (ref 2–6.9)
POC LYMPH %: 28.6 % (ref 10–50)
POC MID %: 6.5 %M (ref 0–12)
Platelet Count, POC: 253 10*3/uL (ref 142–424)
RBC: 4.72 M/uL (ref 4.04–5.48)
RDW, POC: 13 %
WBC: 10 10*3/uL (ref 4.6–10.2)

## 2014-10-29 LAB — POCT UA - MICROSCOPIC ONLY
CRYSTALS, UR, HPF, POC: NEGATIVE
Casts, Ur, LPF, POC: NEGATIVE
Mucus, UA: NEGATIVE
YEAST UA: NEGATIVE

## 2014-10-29 LAB — POCT URINALYSIS DIPSTICK
BILIRUBIN UA: NEGATIVE
Glucose, UA: 500
Ketones, UA: NEGATIVE
Leukocytes, UA: NEGATIVE
Nitrite, UA: NEGATIVE
PH UA: 5
RBC UA: NEGATIVE
Urobilinogen, UA: 0.2

## 2014-10-29 LAB — POCT WET PREP WITH KOH
KOH Prep POC: NEGATIVE
Trichomonas, UA: NEGATIVE
YEAST WET PREP PER HPF POC: NEGATIVE

## 2014-10-29 LAB — POCT GLYCOSYLATED HEMOGLOBIN (HGB A1C): HEMOGLOBIN A1C: 11.6

## 2014-10-29 LAB — GLUCOSE, POCT (MANUAL RESULT ENTRY): POC Glucose: 280 mg/dl — AB (ref 70–99)

## 2014-10-29 MED ORDER — INSULIN PEN NEEDLE 31G X 5 MM MISC: 1.0000 | Freq: Every day | Status: DC

## 2014-10-29 MED ORDER — FLUCONAZOLE 150 MG PO TABS: 150.0000 mg | ORAL_TABLET | ORAL | Status: AC

## 2014-10-29 MED ORDER — INSULIN GLARGINE 100 UNIT/ML SOLOSTAR PEN: 36.0000 [IU] | PEN_INJECTOR | Freq: Every day | SUBCUTANEOUS | Status: DC

## 2014-10-29 MED ORDER — GERHARDT'S BUTT CREAM
1.0000 | TOPICAL_CREAM | Freq: Two times a day (BID) | CUTANEOUS | Status: DC
Start: 2014-10-29 — End: 2015-04-15

## 2014-10-29 MED ORDER — SAXAGLIPTIN-METFORMIN ER 2.5-1000 MG PO TB24: 1.0000 | ORAL_TABLET | Freq: Two times a day (BID) | ORAL | Status: DC

## 2014-10-29 NOTE — Progress Notes (Deleted)
   Subjective:    Patient ID: Shelley Escobar, female    DOB: 1971-01-22, 44 y.o.   MRN: [id number]  HPI    Review of Systems     Objective:   Physical Exam        Assessment & Plan:

## 2014-10-29 NOTE — Progress Notes (Signed)
[facility] [address], [place] [address] [phone]  Date:  10/29/2014   Name:  Shelley Escobar   DOB:  01-30-1971   MRN:  [id number]  PCP:  Ellsworth Lennox, MD    Chief Complaint: Follow-up and Urinary Tract Infection   History of Present Illness: Speaking vietnamese.   Shelley Escobar is a 44 y.o. very pleasant female patient who presents with the following:  She does not check her blood sugar daily.  She uses the 36 units daily.  She takes oral medication daily.  She denies any nausea, vomiting, fatigue, polydipsia, polyuria, or tremulousness.  She has noticed some vision changes.  She has never had an eye exam.      Eating: Clear noodles, small portions, soup and vegetables.    Exercise: She walks a lot.  She has a grocery store with restocking heavy items, but she does not engage in exercise.    Urination: She complains of a vaginal rash where it is pruritic.  She states it hurts with urination only at the end when urine touches the genital area.  She has no hematuria, dribbling or frequency.  She has no abnormal vaginal odor, discharge, or rash.  She states that this has appeared before, and she was put on diflucan for several weeks which did not help.    Patient Active Problem List   Diagnosis Date Noted  . Obesity (BMI 30.0-34.9) 01/27/2012  . DM (diabetes mellitus), type 2 07/02/2011  . Dyslipidemia 07/02/2011    Past Medical History  Diagnosis Date  . Hyperlipidemia   . DM (diabetes mellitus)   . Vitamin D deficiency   . SVD (spontaneous vaginal delivery)     x 3  . Anemia     hx    Past Surgical History  Procedure Laterality Date  . Tubal ligation      Age 42  . Hysteroscopy w/d&c N/A 08/07/2013    Procedure: DILATATION AND CURETTAGE /HYSTEROSCOPY;  Surgeon: Azalia Bilis, MD;  Location: [facility];  Service: Gynecology;  Laterality: N/A;  . Intrauterine device (iud) insertion  09/13/2013    History  Substance Use Topics  . Smoking  status: Never Smoker   . Smokeless tobacco: Never Used  . Alcohol Use: No    History reviewed. No pertinent family history.  No Known Allergies  Medication list has been reviewed and updated.  Current Outpatient Prescriptions on File Prior to Visit  Medication Sig Dispense Refill  . diclofenac (VOLTAREN) 75 MG EC tablet Take 1 tablet (75 mg total) by mouth 2 (two) times daily. 60 tablet 3  . gemfibrozil (LOPID) 600 MG tablet TAKE 1 TABLET BY MOUTH TWICE DAILY 60 tablet 5  . ibuprofen (ADVIL,MOTRIN) 200 MG tablet Take 200 mg by mouth every 6 (six) hours as needed for headache.    . Insulin Glargine (LANTUS SOLOSTAR) 100 UNIT/ML Solostar Pen Inject 36 Units into the skin daily at 10 pm. 5 pen 11  . Insulin Pen Needle 31G X 5 MM MISC 1 each by Does not apply route daily. 100 each 3  . lisinopril (ZESTRIL) 2.5 MG tablet Take 1 tablet (2.5 mg total) by mouth daily. 30 tablet 5  . nystatin-triamcinolone (MYCOLOG II) cream APPLY TO THE AFFECTED AREA TWICE DAILY UP TO 7 DAYS 60 g 3  . Saxagliptin-Metformin 2.10-998 MG TB24 Take 1 tablet by mouth 2 (two) times daily after a meal. 60 tablet 5  . fluconazole (DIFLUCAN) 150 MG  tablet Take 1 tablet (150 mg total) by mouth once a week. (Patient not taking: Reported on 10/29/2014) 6 tablet 0   No current facility-administered medications on file prior to visit.    Review of Systems: ROS otherwise unremarkable unless listed above.   Physical Examination: Filed Vitals:   10/29/14 1135  BP: 110/70  Pulse: 82  Temp: 97.9 F (36.6 C)  Resp: 20   Filed Vitals:   10/29/14 1135  Height: 5' 3.75" (1.619 m)  Weight: 167 lb 2 oz (75.807 kg)   Body mass index is 28.92 kg/(m^2). Ideal Body Weight: Weight in (lb) to have BMI = 25: 144.2  Physical Exam  Constitutional: She is oriented to person, place, and time. She appears well-developed and well-nourished. No distress.  HENT:  Head: Normocephalic and atraumatic.  Eyes: EOM are normal. Pupils are  equal, round, and reactive to light.  Neck: Normal range of motion. No thyromegaly present.  Cardiovascular: Normal rate, regular rhythm and normal heart sounds.  Exam reveals no friction rub.   No murmur heard. Pulmonary/Chest: Effort normal and breath sounds normal. No respiratory distress. She has no wheezes.  Abdominal: Soft. Bowel sounds are normal. She exhibits no distension. There is no hepatosplenomegaly. There is tenderness in the suprapubic area. There is no tenderness at McBurney's point and negative Murphy's sign.  Genitourinary: There is rash on the right labia. There is rash on the left labia.  Labial minora with erythematous papule rash.  There is no exudate or odor.    Musculoskeletal: She exhibits no edema.  Neurological: [name], place, and time.  Skin: Skin is warm and dry. She is not diaphoretic.  Psychiatric: She has a normal mood and affect. Her behavior is normal.     Results for orders placed or performed in visit on 10/29/14  POCT UA - Microscopic Only  Result Value Ref Range   WBC, Ur, HPF, POC 1-4    RBC, urine, microscopic 0-1    Bacteria, U Microscopic 1+    Mucus, UA neg    Epithelial cells, urine per micros 10-15    Crystals, Ur, HPF, POC neg    Casts, Ur, LPF, POC neg    Yeast, UA neg   POCT urinalysis dipstick  Result Value Ref Range   Color, UA yellow    Clarity, UA clear    Glucose, UA 500    Bilirubin, UA neg    Ketones, UA neg    Spec Grav, UA >=1.030    Blood, UA neg    pH, UA 5.0    Protein, UA trace    Urobilinogen, UA 0.2    Nitrite, UA neg    Leukocytes, UA Negative   POCT CBC  Result Value Ref Range   WBC 10.0 4.6 - 10.2 K/uL   Lymph, poc 2.9 0.6 - 3.4   POC LYMPH PERCENT 28.6 10 - 50 %L   MID (cbc) 0.6 0 - 0.9   POC MID % 6.5 0 - 12 %M   POC Granulocyte 6.5 2 - 6.9   Granulocyte percent 64.5 37 - 80 %G   RBC 4.72 4.04 - 5.48 M/uL   Hemoglobin 10.8 (A) 12.2 - 16.2 g/dL   HCT, POC 34.2 (A) 37.7 -  47.9 %   MCV 72.5 (A) 80 - 97 fL   MCH, POC 22.8 (A) 27 - 31.2 pg   MCHC 31.5 (A) 31.8 - 35.4 g/dL   RDW, POC 13.0 %  Platelet Count, POC 253 142 - 424 K/uL   MPV 9.7 0 - 99.8 fL  POCT glucose (manual entry)  Result Value Ref Range   POC Glucose 280 (A) 70 - 99 mg/dl  POCT glycosylated hemoglobin (Hb A1C)  Result Value Ref Range   Hemoglobin A1C 11.6   POCT Wet Prep with KOH  Result Value Ref Range   Trichomonas, UA Negative    Clue Cells Wet Prep HPF POC 0-1    Epithelial Wet Prep HPF POC 0-10    Yeast Wet Prep HPF POC neg    Bacteria Wet Prep HPF POC 3+    RBC Wet Prep HPF POC tntc    WBC Wet Prep HPF POC 6-10    KOH Prep POC Negative   .   Assessment and Plan: 44 year old female is here today for chief complaint follow up for diabetic medication and vaginal rash.  DM w/o complication type II - Plan: COMPLETE METABOLIC PANEL WITH GFR, Insulin Pen Needle 31G X 5 MM MISC, Saxagliptin-Metformin 2.10-998 MG TB 24, Insulin Glargine (LANTUS SOLOSTAR) 100 UNIT/ML Solostar Pen -At this time her ac has worsened.  Advised patient and son to check pharmacy and [employer] for more affordable glucose monitoring kits.  They stated that they cost $60 for 20 strips.  [facility] have glucose kits for 100 ct for 49.99.  Discussed this with patient.  We will need a glucose monitoring kit in order to begin titration of the insulin. -I have also advised patient to avoid carbs such as noodles and rice, but increase vegetable and protein intake.   -Advised to increase exercise to 4x per week of 30 minute aerobic activity.     Rash of vulva - Plan: POCT UA - Microscopic Only, POCT urinalysis dipstick, POCT Wet Prep with KOH  Rash Labial irritation - Plan: fluconazole (DIFLUCAN) 150 MG tablet, Hydrocortisone (GERHARDT'S BUTT CREAM) CREA -Discussed glucose control, and difficulty to remedy with uncontrolled diabetes.  Treating again with the weekly diflucan, and cream.  Ivar Drape,  PA-C [facility] 5/13/201612:51 PM

## 2014-10-29 NOTE — Patient Instructions (Addendum)
Please go to your pharmacy, and [employer] and see how much there glucose kits are.  We need this so we can change the insulin without being dangerously low.   Please manage your diet better, by decreasing your carbohydrate intake.  Decrease the rice, noodles, and sugars.  Include fresh vegetables, lean meats in your diet.  Diabetes Mellitus and Food It is important for you to manage your blood sugar (glucose) level. Your blood glucose level can be greatly affected by what you eat. Eating healthier foods in the appropriate amounts throughout the day at about the same time each day will help you control your blood glucose level. It can also help slow or prevent worsening of your diabetes mellitus. Healthy eating may even help you improve the level of your blood pressure and reach or maintain a healthy weight.  HOW CAN FOOD AFFECT ME? Carbohydrates Carbohydrates affect your blood glucose level more than any other type of food. Your dietitian will help you determine how many carbohydrates to eat at each meal and teach you how to count carbohydrates. Counting carbohydrates is important to keep your blood glucose at a healthy level, especially if you are using insulin or taking certain medicines for diabetes mellitus. Alcohol Alcohol can cause sudden decreases in blood glucose (hypoglycemia), especially if you use insulin or take certain medicines for diabetes mellitus. Hypoglycemia can be a life-threatening condition. Symptoms of hypoglycemia (sleepiness, dizziness, and disorientation) are similar to symptoms of having too much alcohol.  If your health care provider has given you approval to drink alcohol, do so in moderation and use the following guidelines:  Women should not have more than one drink per day, and men should not have more than two drinks per day. One drink is equal to:  12 oz of beer.  5 oz of wine.  1 oz of hard liquor.  Do not drink on an empty stomach.  Keep yourself hydrated.  Have water, diet soda, or unsweetened iced tea.  Regular soda, juice, and other mixers might contain a lot of carbohydrates and should be counted. WHAT FOODS ARE NOT RECOMMENDED? As you make food choices, it is important to remember that all foods are not the same. Some foods have fewer nutrients per serving than other foods, even though they might have the same number of calories or carbohydrates. It is difficult to get your body what it needs when you eat foods with fewer nutrients. Examples of foods that you should avoid that are high in calories and carbohydrates but low in nutrients include:  Trans fats (most processed foods list trans fats on the Nutrition Facts label).  Regular soda.  Juice.  Candy.  Sweets, such as cake, pie, doughnuts, and cookies.  Fried foods. WHAT FOODS CAN I EAT? Have nutrient-rich foods, which will nourish your body and keep you healthy. The food you should eat also will depend on several factors, including:  The calories you need.  The medicines you take.  Your weight.  Your blood glucose level.  Your blood pressure level.  Your cholesterol level. You also should eat a variety of foods, including:  Protein, such as meat, poultry, fish, tofu, nuts, and seeds (lean animal proteins are best).  Fruits.  Vegetables.  Dairy products, such as milk, cheese, and yogurt (low fat is best).  Breads, grains, pasta, cereal, rice, and beans.  Fats such as olive oil, trans fat-free margarine, canola oil, avocado, and olives. DOES EVERYONE WITH DIABETES MELLITUS HAVE THE SAME MEAL  PLAN? Because every person with diabetes mellitus is different, there is not one meal plan that works for everyone. It is very important that you meet with a dietitian who will help you create a meal plan that is just right for you. Document Released: 03/03/2005 Document Revised: 06/11/2013 Document Reviewed: 05/03/2013 [facility] Patient Information 2015 [place], [place]. This  information is not intended to replace advice given to you by your health care provider. Make sure you discuss any questions you have with your health care provider.    Iron-Rich Diet An iron-rich diet contains foods that are good sources of iron. Iron is an important mineral that helps your body produce hemoglobin. Hemoglobin is a protein in red blood cells that carries oxygen to the body's tissues. Sometimes, the iron level in your blood can be low. This may be caused by:  A lack of iron in your diet.  Blood loss.  Times of growth, such as during pregnancy or during a child's growth and development. Low levels of iron can cause a decrease in the number of red blood cells. This can result in iron deficiency anemia. Iron deficiency anemia symptoms include:  Tiredness.  Weakness.  Irritability.  Increased chance of infection. Here are some recommendations for daily iron intake:  Males older than 46 years of age need 8 mg of iron per day.  Women ages 47 to 61 need 18 mg of iron per day.  Pregnant women need 27 mg of iron per day, and women who are over 29 years of age and breastfeeding need 9 mg of iron per day.  Women over the age of 70 need 8 mg of iron per day. SOURCES OF IRON There are 2 types of iron that are found in food: heme iron and nonheme iron. Heme iron is absorbed by the body better than nonheme iron. Heme iron is found in meat, poultry, and fish. Nonheme iron is found in grains, beans, and vegetables. Heme Iron Sources Food / Iron (mg)  Chicken liver, 3 oz (85 g)/ 10 mg  Beef liver, 3 oz (85 g)/ 5.5 mg  Oysters, 3 oz (85 g)/ 8 mg  Beef, 3 oz (85 g)/ 2 to 3 mg  Shrimp, 3 oz (85 g)/ 2.8 mg  [place], 3 oz (85 g)/ 2 mg  Chicken, 3 oz (85 g) / 1 mg  Fish (tuna, halibut), 3 oz (85 g)/ 1 mg  Pork, 3 oz (85 g)/ 0.9 mg Nonheme Iron Sources Food / Iron (mg)  Ready-to-eat breakfast cereal, iron-fortified / 3.9 to 7 mg  Tofu,  cup / 3.4 mg  Kidney beans,   cup / 2.6 mg  Baked potato with skin / 2.7 mg  Asparagus,  cup / 2.2 mg  Avocado / 2 mg  Dried peaches,  cup / 1.6 mg  Raisins,  cup / 1.5 mg  Soy milk, 1 cup / 1.5 mg  Whole-wheat bread, 1 slice / 1.2 mg  Spinach, 1 cup / 0.8 mg  Broccoli,  cup / 0.6 mg IRON ABSORPTION Certain foods can decrease the body's absorption of iron. Try to avoid these foods and beverages while eating meals with iron-containing foods:  Coffee.  Tea.  Fiber.  Soy. Foods containing vitamin C can help increase the amount of iron your body absorbs from iron sources, especially from nonheme sources. Eat foods with vitamin C along with iron-containing foods to increase your iron absorption. Foods that are high in vitamin C include many fruits and vegetables. Some good  sources are:  Fresh orange juice.  Oranges.  Strawberries.  Mangoes.  Grapefruit.  Red bell peppers.  Green bell peppers.  Broccoli.  Potatoes with skin.  Tomato juice. Document Released: 01/18/2005 Document Revised: 08/29/2011 Document Reviewed: 11/25/2010 [facility] Patient Information 2015 [place], [place]. This information is not intended to replace advice given to you by your health care provider. Make sure you discuss any questions you have with your health care provider.

## 2014-10-30 LAB — COMPLETE METABOLIC PANEL WITH GFR
ALT: 28 U/L (ref 0–35)
AST: 32 U/L (ref 0–37)
Albumin: 4.3 g/dL (ref 3.5–5.2)
Alkaline Phosphatase: 72 U/L (ref 39–117)
BILIRUBIN TOTAL: 0.5 mg/dL (ref 0.2–1.2)
BUN: 14 mg/dL (ref 6–23)
CO2: 25 mEq/L (ref 19–32)
CREATININE: 0.58 mg/dL (ref 0.50–1.10)
Calcium: 9.7 mg/dL (ref 8.4–10.5)
Chloride: 98 mEq/L (ref 96–112)
GFR, Est African American: >89 mL/min
GLUCOSE: 234 mg/dL — AB (ref 70–99)
Potassium: 3.8 mEq/L (ref 3.5–5.3)
Sodium: 133 mEq/L — ABNORMAL LOW (ref 135–145)
TOTAL PROTEIN: 8 g/dL (ref 6.0–8.3)

## 2014-11-05 ENCOUNTER — Ambulatory Visit: Payer: BLUE CROSS/BLUE SHIELD | Admitting: Family Medicine

## 2015-01-22 ENCOUNTER — Encounter: Payer: Self-pay | Admitting: *Deleted

## 2015-02-04 ENCOUNTER — Other Ambulatory Visit: Payer: Self-pay | Admitting: Physician Assistant

## 2015-04-15 ENCOUNTER — Ambulatory Visit (INDEPENDENT_AMBULATORY_CARE_PROVIDER_SITE_OTHER): Payer: BLUE CROSS/BLUE SHIELD | Admitting: Physician Assistant

## 2015-04-15 ENCOUNTER — Ambulatory Visit (INDEPENDENT_AMBULATORY_CARE_PROVIDER_SITE_OTHER): Payer: BLUE CROSS/BLUE SHIELD

## 2015-04-15 VITALS — BP 108/74 | HR 94 | Temp 98.0°F | Resp 14 | Ht 63.5 in | Wt 171.2 lb

## 2015-04-15 DIAGNOSIS — R109 Unspecified abdominal pain: Secondary | ICD-10-CM | POA: Diagnosis not present

## 2015-04-15 DIAGNOSIS — Z794 Long term (current) use of insulin: Secondary | ICD-10-CM | POA: Diagnosis not present

## 2015-04-15 DIAGNOSIS — Z114 Encounter for screening for human immunodeficiency virus [HIV]: Secondary | ICD-10-CM

## 2015-04-15 DIAGNOSIS — E119 Type 2 diabetes mellitus without complications: Secondary | ICD-10-CM | POA: Diagnosis not present

## 2015-04-15 DIAGNOSIS — E1165 Type 2 diabetes mellitus with hyperglycemia: Secondary | ICD-10-CM | POA: Diagnosis not present

## 2015-04-15 DIAGNOSIS — K5900 Constipation, unspecified: Secondary | ICD-10-CM

## 2015-04-15 DIAGNOSIS — IMO0001 Reserved for inherently not codable concepts without codable children: Secondary | ICD-10-CM

## 2015-04-15 LAB — POCT CBC
GRANULOCYTE PERCENT: 64.3 % (ref 37–80)
HEMATOCRIT: 34.3 % — AB (ref 37.7–47.9)
Hemoglobin: 11.4 g/dL — AB (ref 12.2–16.2)
Lymph, poc: 3.2 (ref 0.6–3.4)
MCH, POC: 23.5 pg — AB (ref 27–31.2)
MCHC: 33.3 g/dL (ref 31.8–35.4)
MCV: 70.8 fL — AB (ref 80–97)
MID (CBC): 1 — AB (ref 0–0.9)
MPV: 8.6 fL (ref 0–99.8)
PLATELET COUNT, POC: 302 10*3/uL (ref 142–424)
POC Granulocyte: 7.5 — AB (ref 2–6.9)
POC LYMPH %: 27.2 % (ref 10–50)
POC MID %: 8.5 % (ref 0–12)
RBC: 4.85 M/uL (ref 4.04–5.48)
RDW, POC: 13.9 %
WBC: 11.6 10*3/uL — AB (ref 4.6–10.2)

## 2015-04-15 LAB — GLUCOSE, POCT (MANUAL RESULT ENTRY): POC Glucose: 258 mg/dl — AB (ref 70–99)

## 2015-04-15 LAB — HEMOGLOBIN A1C: Hgb A1c MFr Bld: 10.6 % — AB (ref 4.0–6.0)

## 2015-04-15 LAB — POCT GLYCOSYLATED HEMOGLOBIN (HGB A1C): HEMOGLOBIN A1C: 10.6

## 2015-04-15 MED ORDER — LISINOPRIL 2.5 MG PO TABS: 2.5000 mg | ORAL_TABLET | Freq: Every day | ORAL | Status: DC

## 2015-04-15 MED ORDER — SAXAGLIPTIN-METFORMIN ER 2.5-1000 MG PO TB24: 1.0000 | ORAL_TABLET | Freq: Two times a day (BID) | ORAL | Status: DC

## 2015-04-15 MED ORDER — GEMFIBROZIL 600 MG PO TABS: 600.0000 mg | ORAL_TABLET | Freq: Two times a day (BID) | ORAL | Status: DC

## 2015-04-15 MED ORDER — INSULIN GLARGINE 100 UNIT/ML SOLOSTAR PEN: 46.0000 [IU] | PEN_INJECTOR | Freq: Every day | SUBCUTANEOUS | Status: DC

## 2015-04-15 MED ORDER — INSULIN PEN NEEDLE 31G X 5 MM MISC: Status: DC

## 2015-04-15 NOTE — Patient Instructions (Addendum)
Please schedule: 1. Eye exam 2. Dental evaluation/cleaning 3. Mammogram  Your diabetes is not well controlled. This increases the risk of heart attack and stroke.  Please INCREASE the Lantus to 46 units each evening. It's important that you check your glucose in the mornings while we are adjusting the medications. Instead of rechecking you in 3 months, we need to recheck in 1 month.  Lean proteins, vegetables. When you eat grains, they should be WHOLE grains (like brown rice). Limit sugars. Be careful with fruit, as that can also cause your sugar to rise.  For the constipation, get MIRALAX. Mix 1 capful in 8 ounces of water and drink, do this 2 times each day until your bowels are moving regularly again.

## 2015-04-15 NOTE — Progress Notes (Signed)
Patient ID: Shelley Escobar, female    DOB: 11/16/1970, 44 y.o.   MRN: [id number]  PCP: Ellsworth Lennox, MD  Subjective:   Chief Complaint  Patient presents with  . Diabetes  . Medication Refill    All  . Abdominal Pain    After eating boiled egg yesterday    HPI Presents for evaluation of diabetes, medication refills and evaluation of abdominal pain. Her son translates.  At her visit in 10/2014, A1C was 11.6, but no adjustments were made in the regimen.  Abdominal pain began yesterday afternoon. Crampy, like she needs to rush to have a BM, but when she gets there, doesn't have anything come out. Last BM today about 3 pm, but was very small. Last normal BM yesterday morning, before the pain. The only thing she can identify is that she ate a boiled egg that may have been spoiled several hours before the pain began.  No fever, chills, nausea, vomiting. No urinary changes.  Doesn't check blood sugar at home because test strips are too expensive. Takes medications as prescribed. No adverse effects. Has not seen an eye specialist in the past year. Has not seen a dentist, possibly ever. Current with seasonal influenza vaccine and pneumococcal vaccine.  Needs to establish with a new PCP since Dr. Janelle Floor retirement.  Review of Systems  Constitutional: Negative for activity change, appetite change, fatigue and unexpected weight change.  HENT: Negative for congestion, dental problem, ear pain, hearing loss, mouth sores, postnasal drip, rhinorrhea, sneezing, sore throat, tinnitus and trouble swallowing.   Eyes: Negative for photophobia, pain, redness and visual disturbance.  Respiratory: Negative for cough, chest tightness and shortness of breath.   Cardiovascular: Negative for chest pain, palpitations and leg swelling.  Gastrointestinal: Positive for abdominal pain and constipation. Negative for nausea, vomiting, diarrhea and blood in stool.  Genitourinary: Negative for dysuria,  urgency, frequency and hematuria.  Musculoskeletal: Negative for myalgias, arthralgias, gait problem and neck stiffness.  Skin: Negative for rash.  Neurological: Negative for dizziness, speech difficulty, weakness, light-headedness, numbness and headaches.  Hematological: Negative for adenopathy.  Psychiatric/Behavioral: Negative for confusion and sleep disturbance. The patient is not nervous/anxious.        Patient Active Problem List   Diagnosis Date Noted  . Obesity (BMI 30.0-34.9) 01/27/2012  . DM (diabetes mellitus), type 2 ([place]) 07/02/2011  . Dyslipidemia 07/02/2011     Prior to Admission medications   Medication Sig Start Date End Date Taking? Authorizing Provider  B-D UF III MINI PEN NEEDLES 31G X 5 MM MISC USE DAILY AS DIRECTED WITH INSULIN 02/05/15  Yes Colletta [place] D English, PA  gemfibrozil (LOPID) 600 MG tablet TAKE 1 TABLET BY MOUTH TWICE DAILY 08/12/14  Yes Barton Fanny, MD  Insulin Glargine (LANTUS SOLOSTAR) 100 UNIT/ML Solostar Pen Inject 36 Units into the skin daily at 10 pm. 10/29/14  Yes Dorian Heckle English, PA  Saxagliptin-Metformin 2.10-998 MG TB24 Take 1 tablet by mouth 2 (two) times daily after a meal. 10/29/14  Yes Dorian Heckle English, PA  lisinopril (ZESTRIL) 2.5 MG tablet Take 1 tablet (2.5 mg total) by mouth daily. 08/14/13   Barton Fanny, MD     No Known Allergies     Objective:  Physical Exam  Constitutional: She is oriented to person, place, and time. She appears well-developed and well-nourished. No distress.  BP 108/74 mmHg  Pulse 94  Temp(Src) 98 F (36.7 C) (Oral)  Resp 14  Ht 5' 3.5" (1.613 m)  Wt 171  lb 3.2 oz (77.656 kg)  BMI 29.85 kg/m2  SpO2 99%   Eyes: Conjunctivae are normal. No scleral icterus.  Neck: No thyromegaly present.  Cardiovascular: Normal rate, regular rhythm, normal heart sounds and intact distal pulses.   Pulmonary/Chest: Effort normal and breath sounds normal.  Abdominal: Normal appearance and bowel sounds  are normal. There is generalized tenderness. There is no rigidity, no rebound, no guarding, no CVA tenderness, no tenderness at McBurney's point and negative Murphy's sign. No hernia.  Lymphadenopathy:    She has no cervical adenopathy.  Neurological: [name], place, and time.  Skin: Skin is warm and dry.  Psychiatric: She has a normal mood and affect. Her behavior is normal.       Results for orders placed or performed in visit on 04/15/15  POCT glucose (manual entry)  Result Value Ref Range   POC Glucose 258 (A) 70 - 99 mg/dl  POCT glycosylated hemoglobin (Hb A1C)  Result Value Ref Range   Hemoglobin A1C 10.6   POCT CBC  Result Value Ref Range   WBC 11.6 (A) 4.6 - 10.2 K/uL   Lymph, poc 3.2 0.6 - 3.4   POC LYMPH PERCENT 27.2 10 - 50 %L   MID (cbc) 1.0 (A) 0 - 0.9   POC MID % 8.5 0 - 12 %M   POC Granulocyte 7.5 (A) 2 - 6.9   Granulocyte percent 64.3 37 - 80 %G   RBC 4.85 4.04 - 5.48 M/uL   Hemoglobin 11.4 (A) 12.2 - 16.2 g/dL   HCT, POC 34.3 (A) 37.7 - 47.9 %   MCV 70.8 (A) 80 - 97 fL   MCH, POC 23.5 (A) 27 - 31.2 pg   MCHC 33.3 31.8 - 35.4 g/dL   RDW, POC 13.9 %   Platelet Count, POC 302 142 - 424 K/uL   MPV 8.6 0 - 99.8 fL   Acute Abdominal Series: [facility] reading (PRIMARY) by  Dr. Tamala Julian. Large stool burden. No Ileus. No mass. No free air.      Assessment & Plan:   1. Uncontrolled type 2 diabetes mellitus without complication, with long-term current use of insulin (HCC) Increase Lantus to 46 u SQ daily. Refer to diabetes education. Reassess in 4 weeks. Consider addition of Invokana or Trulicity. - POCT glucose (manual entry) - POCT glycosylated hemoglobin (Hb A1C) - Microalbumin, urine - Saxagliptin-Metformin 2.10-998 MG TB24; Take 1 tablet by mouth 2 (two) times daily after a meal.  Dispense: 180 tablet; Refill: 3 - lisinopril (ZESTRIL) 2.5 MG tablet; Take 1 tablet (2.5 mg total) by mouth daily.  Dispense: 90 tablet; Refill: 3 - Insulin  Glargine (LANTUS SOLOSTAR) 100 UNIT/ML Solostar Pen; Inject 46 Units into the skin daily at 10 pm.  Dispense: 5 pen; Refill: 3 - gemfibrozil (LOPID) 600 MG tablet; Take 1 tablet (600 mg total) by mouth 2 (two) times daily.  Dispense: 180 tablet; Refill: 3 - Insulin Pen Needle (B-D UF III MINI PEN NEEDLES) 31G X 5 MM MISC; USE DAILY AS DIRECTED WITH INSULIN  Dispense: 100 each; Refill: 3 - Ambulatory referral to diabetic education  2. Abdominal pain, unspecified abdominal location Due to constipation. - POCT CBC - DG Abd Acute W/Chest  3. Constipation, unspecified constipation type OTC Miralax 1-2 times daily as needed. - TSH  4. Screening for HIV (human immunodeficiency virus) - HIV antibody   Return in about 1 month (around 05/16/2015) for re-evaluation of diabetes with Laural Roes or Dr.  Berle Mull, PA-C Physician Assistant-Certified [facility]

## 2015-04-16 LAB — HIV ANTIBODY (ROUTINE TESTING W REFLEX): HIV 1&2 Ab, 4th Generation: NONREACTIVE

## 2015-04-16 LAB — TSH: TSH: 1.094 u[IU]/mL (ref 0.350–4.500)

## 2015-04-17 ENCOUNTER — Encounter: Payer: Self-pay | Admitting: Family Medicine

## 2015-04-17 LAB — MICROALBUMIN, URINE: MICROALB UR: 1.3 mg/dL

## 2015-04-19 ENCOUNTER — Encounter: Payer: Self-pay | Admitting: Physician Assistant

## 2015-05-12 ENCOUNTER — Ambulatory Visit: Payer: BLUE CROSS/BLUE SHIELD | Admitting: Physician Assistant

## 2015-06-30 ENCOUNTER — Ambulatory Visit (INDEPENDENT_AMBULATORY_CARE_PROVIDER_SITE_OTHER): Payer: BLUE CROSS/BLUE SHIELD | Admitting: Internal Medicine

## 2015-06-30 VITALS — BP 106/70 | HR 64 | Temp 98.8°F | Resp 18 | Ht 64.0 in | Wt 168.8 lb

## 2015-06-30 DIAGNOSIS — J22 Unspecified acute lower respiratory infection: Secondary | ICD-10-CM

## 2015-06-30 DIAGNOSIS — J988 Other specified respiratory disorders: Secondary | ICD-10-CM | POA: Diagnosis not present

## 2015-06-30 MED ORDER — HYDROCODONE-HOMATROPINE 5-1.5 MG/5ML PO SYRP: 5.0000 mL | ORAL_SOLUTION | Freq: Four times a day (QID) | ORAL | Status: DC | PRN

## 2015-06-30 MED ORDER — AZITHROMYCIN 250 MG PO TABS: ORAL_TABLET | ORAL | Status: DC

## 2015-06-30 NOTE — Progress Notes (Signed)
Subjective:  By signing my name below, I, Rawaa Al Rifaie, attest that this documentation has been prepared under the direction and in the presence of Tami Lin, MD.  Royann Shivers Rifaie, Medical Scribe. 06/30/2015.  10:31 AM.  I have completed the patient encounter in its entirety as documented by the scribe, with editing by me where necessary. Chou Busler P. Laney Pastor, M.D.    Patient ID: Shelley Escobar, female    DOB: 1970/10/12, 45 y.o.   MRN: [id number]  Chief Complaint  Patient presents with  . Cough    productive cough x 2 weeks    HPI HPI Comments: Shelley Escobar is a 45 y.o. female who presents to [facility] with her son as an interpreter, complaining of cough, gradual onset 2 week ago.  Per pt's son, pt's symptoms started with a fever, hot/cold symptoms, rhinorrhea, and mild cough during the day time. However, pt reports that her cough symptoms became more severe and productive with green-colored mucus last week. Pt reports that the cough is very severe at night time that is keeps her from sleeping. She indicates that the fever have not completely resolved, rather it is present intermittently. She denies shortness of breath, wheezing. Pt has a hx of asthma.   Patient Active Problem List   Diagnosis Date Noted  . Obesity (BMI 30.0-34.9) 01/27/2012  . Diabetes mellitus type 2, uncontrolled, without complications ([place]) [id number]  . Dyslipidemia 07/02/2011   Past Medical History  Diagnosis Date  . Hyperlipidemia   . DM (diabetes mellitus) ([place])   . Vitamin D deficiency   . SVD (spontaneous vaginal delivery)     x 3  . Anemia     hx   Past Surgical History  Procedure Laterality Date  . Tubal ligation      Age 64  . Hysteroscopy w/d&c N/A 08/07/2013    Procedure: DILATATION AND CURETTAGE /HYSTEROSCOPY;  Surgeon: Azalia Bilis, MD;  Location: [facility];  Service: Gynecology;  Laterality: N/A;  . Intrauterine device (iud) insertion  09/13/2013   No Known  Allergies Prior to Admission medications   Medication Sig Start Date End Date Taking? Authorizing Provider  gemfibrozil (LOPID) 600 MG tablet Take 1 tablet (600 mg total) by mouth 2 (two) times daily. 04/15/15  Yes Chelle Jeffery, PA-C  ibuprofen (ADVIL,MOTRIN) 200 MG tablet Take 200 mg by mouth every 6 (six) hours as needed for headache.   Yes Historical Provider, MD  Insulin Glargine (LANTUS SOLOSTAR) 100 UNIT/ML Solostar Pen Inject 46 Units into the skin daily at 10 pm. 04/15/15  Yes Chelle Jeffery, PA-C  Insulin Pen Needle (B-D UF III MINI PEN NEEDLES) 31G X 5 MM MISC USE DAILY AS DIRECTED WITH INSULIN 04/15/15  Yes Chelle Jeffery, PA-C  lisinopril (ZESTRIL) 2.5 MG tablet Take 1 tablet (2.5 mg total) by mouth daily. 04/15/15  Yes Chelle Jeffery, PA-C  Saxagliptin-Metformin 2.10-998 MG TB24 Take 1 tablet by mouth 2 (two) times daily after a meal. 04/15/15  Yes Harrison Mons, PA-C   Social History   Social History  . Marital Status: Married    Spouse Name: [name]  . Number of Children: 3  . Years of Education: none   Occupational History  . sales     self-employed (Health visitor)   Social History Main Topics  . Smoking status: Never Smoker   . Smokeless tobacco: Never Used  . Alcohol Use: No  . Drug Use: No  . Sexual Activity:  Partners: Female, Female    Birth Control/ Protection: Surgical     Comment: Tubal   Other Topics Concern  . Not on file   Social History Narrative   Exercise walking 4 days per week for 30 minutes.    Education : "war baby," adopted, never attended school    Review of Systems  Constitutional: Positive for fever and chills.  Respiratory: Positive for cough. Negative for shortness of breath and wheezing.   Psychiatric/Behavioral: Positive for sleep disturbance.      Objective:   Physical Exam  Constitutional: She is oriented to person, place, and time. She appears well-developed and well-nourished. No distress.  HENT:  Head:  Normocephalic and atraumatic.  Throat is clear   Eyes: EOM are normal. Pupils are equal, round, and reactive to light.  Neck: Neck supple.  Cardiovascular: Normal rate.   Pulmonary/Chest: Effort normal. She has rhonchi (rhonchi in both lungs. ).  Neurological: [name], place, and time. No cranial nerve deficit.  Skin: Skin is warm and dry.  Psychiatric: She has a normal mood and affect. Her behavior is normal.  Nursing note and vitals reviewed.   BP 106/70 mmHg  Pulse 64  Temp(Src) 98.8 F (37.1 C) (Oral)  Resp 18  Ht 5\' 4"  (1.626 m)  Wt 168 lb 12.8 oz (76.567 kg)  BMI 28.96 kg/m2  SpO2 96%     Assessment & Plan:  Lower respiratory infection  Meds ordered this encounter  Medications  . HYDROcodone-homatropine (HYCODAN) 5-1.5 MG/5ML syrup    Sig: Take 5 mLs by mouth every 6 (six) hours as needed.    Dispense:  120 mL    Refill:  0  . azithromycin (ZITHROMAX) 250 MG tablet    Sig: As packaged    Dispense:  6 tablet    Refill:  0   Fu 10d not well

## 2015-07-07 ENCOUNTER — Telehealth: Payer: Self-pay

## 2015-07-07 NOTE — Telephone Encounter (Signed)
Patient's son is calling because patient is having difficulty getting her cholesterol medication. That pharmacy told her that that the medication will be $300 out of pocket unless a provider consults with the pharmacy. Please call patient's son Shelley Escobar!

## 2015-07-08 NOTE — Telephone Encounter (Signed)
Patient son is calling to follow up on message from yesterday

## 2015-07-08 NOTE — Telephone Encounter (Signed)
Shelley Escobar can you look into this.

## 2015-07-08 NOTE — Telephone Encounter (Signed)
Spoke with son, he states the Janumet is 300 dollars! He states the insurance goes back and forth with the price. She has [place].

## 2015-07-10 NOTE — Telephone Encounter (Signed)
PT's son called to check the status of PA//Pt has been out of Rx since 07/06/15.    [phone]- [phone]

## 2015-07-13 NOTE — Telephone Encounter (Signed)
I have not gotten anything from pharm yet for this. I called pharmacist who stated that it looks like the Janumet was not covered, but they have a Rx for combiglyze that was sent to replace the [place]. It was covered, but pt has to meet a deductible first. The payment for the Rx until deductible is met is almost $300. She is not sure that the son understands what the problem is. Chelle, is there any other therapy that might be cheaper for pt she could be switched to? I can call pt/son using interpretor if needed for understanding, but wanted to check w/you first to see if there might be a less expensive route?

## 2015-07-14 MED ORDER — GLIPIZIDE ER 10 MG PO TB24: 10.0000 mg | ORAL_TABLET | Freq: Every day | ORAL | Status: DC

## 2015-07-14 MED ORDER — METFORMIN HCL 1000 MG PO TABS: 1000.0000 mg | ORAL_TABLET | Freq: Two times a day (BID) | ORAL | Status: DC

## 2015-07-14 NOTE — Telephone Encounter (Signed)
We could change her to metformin 1000 mg BID and glipizide XL 10 mg QD.  That will be a lot cheaper, though eventually, will need to be changed, as glipizide is not a good long term option. We can consider a coupon for Wilder Glade, or one of the others when she comes in. OK to send in 90-day supply of each of these and D/C the combiglyze and Janumet.  When I saw the patient in 10/16, she was to RTC to see me or [place] in 1 month.  We increased the Lantus dose.  What's the plan for her to return for re-evaluation?

## 2015-07-14 NOTE — Telephone Encounter (Signed)
Sent in the two new Rxs w/notes to pharm to DC Rxs for janumet and combiglyze. Spoke to son and explained the new medications to him and need for f/up. I helped son set up appt for pt 08/25/15.

## 2015-07-23 ENCOUNTER — Ambulatory Visit (INDEPENDENT_AMBULATORY_CARE_PROVIDER_SITE_OTHER): Payer: BLUE CROSS/BLUE SHIELD | Admitting: Family Medicine

## 2015-07-23 VITALS — BP 108/76 | HR 90 | Temp 98.0°F | Resp 16 | Ht 64.0 in | Wt 173.0 lb

## 2015-07-23 DIAGNOSIS — J22 Unspecified acute lower respiratory infection: Secondary | ICD-10-CM

## 2015-07-23 DIAGNOSIS — R059 Cough, unspecified: Secondary | ICD-10-CM

## 2015-07-23 DIAGNOSIS — J988 Other specified respiratory disorders: Secondary | ICD-10-CM | POA: Diagnosis not present

## 2015-07-23 DIAGNOSIS — R05 Cough: Secondary | ICD-10-CM | POA: Diagnosis not present

## 2015-07-23 MED ORDER — BENZONATATE 100 MG PO CAPS: 200.0000 mg | ORAL_CAPSULE | Freq: Two times a day (BID) | ORAL | Status: DC | PRN

## 2015-07-23 MED ORDER — HYDROCODONE-HOMATROPINE 5-1.5 MG/5ML PO SYRP: 5.0000 mL | ORAL_SOLUTION | Freq: Three times a day (TID) | ORAL | Status: DC | PRN

## 2015-07-23 NOTE — Patient Instructions (Signed)
Afrin as needed for nasal congestion, only 3 times a day

## 2015-08-01 ENCOUNTER — Other Ambulatory Visit: Payer: Self-pay | Admitting: Physician Assistant

## 2015-08-07 NOTE — Progress Notes (Signed)
Chief Complaint:  Chief Complaint  Patient presents with  . Cough    x 1 month  . Ear Pain    left, comes and goes    HPI: Shelley Escobar is a 45 y.o. female who reports to [facility] today complaining of 1 month hx of cough, left ear pain intermittently, No fevers or chills No [place] vision changes, tinnitus. She does have Dm but is on meds and is well controlled She denies any draiange or ear pressure. Neg for facial pain. SHe denesi GERD, Denies allegies or asthma   Past Medical History  Diagnosis Date  . Hyperlipidemia   . DM (diabetes mellitus) ([place])   . Vitamin D deficiency   . SVD (spontaneous vaginal delivery)     x 3  . Anemia     hx   Past Surgical History  Procedure Laterality Date  . Tubal ligation      Age 8  . Hysteroscopy w/d&c N/A 08/07/2013    Procedure: DILATATION AND CURETTAGE /HYSTEROSCOPY;  Surgeon: Azalia Bilis, MD;  Location: [facility];  Service: Gynecology;  Laterality: N/A;  . Intrauterine device (iud) insertion  09/13/2013   Social History   Social History  . Marital Status: Married    Spouse Name: [name]  . Number of Children: 3  . Years of Education: none   Occupational History  . sales     self-employed (Health visitor)   Social History Main Topics  . Smoking status: Never Smoker   . Smokeless tobacco: Never Used  . Alcohol Use: No  . Drug Use: No  . Sexual Activity:    Partners: Female, Female    Birth Control/ Protection: Surgical     Comment: Tubal   Other Topics Concern  . None   Social History Narrative   Exercise walking 4 days per week for 30 minutes.    Education : "war baby," adopted, never attended school   History reviewed. No pertinent family history. No Known Allergies Prior to Admission medications   Medication Sig Start Date End Date Taking? Authorizing Provider  Insulin Glargine (LANTUS SOLOSTAR) 100 UNIT/ML Solostar Pen Inject 46 Units into the skin daily at 10 pm. 04/15/15  Yes Chelle Jeffery, PA-C    Insulin Pen Needle (B-D UF III MINI PEN NEEDLES) 31G X 5 MM MISC USE DAILY AS DIRECTED WITH INSULIN 04/15/15  Yes Chelle Jeffery, PA-C  lisinopril (ZESTRIL) 2.5 MG tablet Take 1 tablet (2.5 mg total) by mouth daily. 04/15/15  Yes Chelle Jeffery, PA-C  metFORMIN (GLUCOPHAGE) 1000 MG tablet Take 1 tablet (1,000 mg total) by mouth 2 (two) times daily with a meal. 07/14/15  Yes Chelle Jeffery, PA-C  benzonatate (TESSALON) 100 MG capsule Take 2 capsules (200 mg total) by mouth 2 (two) times daily as needed. 07/23/15   Thao P Le, DO  HYDROcodone-homatropine (HYCODAN) 5-1.5 MG/5ML syrup Take 5 mLs by mouth every 8 (eight) hours as needed. 07/23/15   Thao P Le, DO     ROS: The patient denies fevers, chills, night sweats, unintentional weight loss, chest pain, palpitations, wheezing, dyspnea on exertion, nausea, vomiting, abdominal pain, dysuria, hematuria, melena, numbness, weakness, or tingling.   All other systems have been reviewed and were otherwise negative with the exception of those mentioned in the HPI and as above.    PHYSICAL EXAM: Filed Vitals:   07/23/15 1609  BP: 108/76  Pulse: 90  Temp: 98 F (36.7 C)  Resp: 16  Body mass index is 29.68 kg/(m^2).   General: Alert, no acute distress HEENT:  Normocephalic, atraumatic, oropharynx patent. EOMI, PERRLA Erythematous throat, no exudates, TM normal, neg sinus tenderness, + erythematous/boggy nasal mucosa Cardiovascular:  Regular rate and rhythm, no rubs murmurs or gallops.  No Carotid bruits, radial pulse intact. No pedal edema.  Respiratory: Clear to auscultation bilaterally.  No wheezes, rales, or rhonchi.  No cyanosis, no use of accessory musculature Abdominal: No organomegaly, abdomen is soft and non-tender, positive bowel sounds. No masses. Skin: No rashes. Neurologic: Facial musculature symmetric. Psychiatric: Patient acts appropriately throughout our interaction. Lymphatic: No cervical or submandibular  lymphadenopathy Musculoskeletal: Gait intact. No edema, tenderness   LABS:    EKG/XRAY:   Primary read interpreted by Dr. Marin Comment at [facility].   ASSESSMENT/PLAN: Encounter Diagnoses  Name Primary?  . Lower respiratory infection Yes  . Cough    She likely has a viral illness Sxs treatment Take otc meds nasacort prn  Rx tessalon and hycodan prn  Fu with chest xray prn , declines today   Gross sideeffects, risk and benefits, and alternatives of medications d/w patient. Patient is aware that all medications have potential sideeffects and we are unable to predict every sideeffect or drug-drug interaction that may occur.  Thao Le DO  08/07/2015 2:51 PM

## 2015-08-25 ENCOUNTER — Encounter: Payer: Self-pay | Admitting: Physician Assistant

## 2015-08-25 ENCOUNTER — Ambulatory Visit (INDEPENDENT_AMBULATORY_CARE_PROVIDER_SITE_OTHER): Payer: BLUE CROSS/BLUE SHIELD | Admitting: Physician Assistant

## 2015-08-25 VITALS — BP 100/61 | HR 84 | Temp 98.3°F | Resp 16 | Ht 64.0 in | Wt 171.4 lb

## 2015-08-25 DIAGNOSIS — R059 Cough, unspecified: Secondary | ICD-10-CM

## 2015-08-25 DIAGNOSIS — R05 Cough: Secondary | ICD-10-CM | POA: Diagnosis not present

## 2015-08-25 DIAGNOSIS — Z794 Long term (current) use of insulin: Secondary | ICD-10-CM

## 2015-08-25 DIAGNOSIS — E1165 Type 2 diabetes mellitus with hyperglycemia: Secondary | ICD-10-CM | POA: Diagnosis not present

## 2015-08-25 DIAGNOSIS — IMO0001 Reserved for inherently not codable concepts without codable children: Secondary | ICD-10-CM

## 2015-08-25 DIAGNOSIS — E785 Hyperlipidemia, unspecified: Secondary | ICD-10-CM

## 2015-08-25 MED ORDER — GEMFIBROZIL 600 MG PO TABS: 600.0000 mg | ORAL_TABLET | Freq: Two times a day (BID) | ORAL | Status: DC

## 2015-08-25 MED ORDER — METFORMIN HCL 1000 MG PO TABS: 1000.0000 mg | ORAL_TABLET | Freq: Two times a day (BID) | ORAL | Status: DC

## 2015-08-25 MED ORDER — INSULIN GLARGINE 100 UNIT/ML SOLOSTAR PEN: 46.0000 [IU] | PEN_INJECTOR | Freq: Every day | SUBCUTANEOUS | Status: DC

## 2015-08-25 MED ORDER — GLIPIZIDE ER 10 MG PO TB24: 10.0000 mg | ORAL_TABLET | Freq: Every day | ORAL | Status: DC

## 2015-08-25 NOTE — Patient Instructions (Signed)
Because you received labwork today, you will receive an invoice from [employer]. Please contact [facility] at [phone] with questions or concerns regarding your invoice. Our billing staff will not be able to assist you with those questions.  You will be contacted with the lab results as soon as they are available. The fastest way to get your results is to activate your My Chart account. Instructions are located on the last page of this paperwork. If you have not heard from [place] regarding the results in 2 weeks, please contact this office.  Please schedule with the eye specialist.  We recommend that you schedule a mammogram for breast cancer screening. Typically, you do not need a referral to do this. Please contact a local imaging center to schedule your mammogram.  [facility] - [phone]  *ask for the Radiology Department [facility] ([place]) - [phone] or [phone]  [facility] - [phone] [place] [phone] [facility] - [phone]  *ask for the [facility] - [phone]  *ask for the Radiology Department [facility] - [phone]  *ask for the [facility] - [phone]  Please STOP the lisinopril. It may be the cause of the cough. If it resolves in the next 2 weeks, please let me know. We will start a different medication (losartan). If it does NOT stop, please return for re-evaluation including chest x-ray.

## 2015-08-25 NOTE — Progress Notes (Signed)
Subjective:    Patient ID: Shelley Escobar, female    DOB: [birth year], 45 y.o.   MRN: [id number]  Chief Complaint  Patient presents with  . Follow-up    diabetes  . Medication Refill    metformin, lantus(SOLOSTAR) and lisinopril   HPI  Presents today for a diabetes follow up with her son [name] who is acting as a Optometrist.   Was diagnosed with diabetes about 15 years ago. Is on 46U of insulin daily in addition to several oral medications. Was taking metformin and was switched to [place]. Called in January because the jenumet became too expensive. She was told to restart metformin 1000mg  BID and start glipizide 10 mg daily. Reports no problems with the medications at this time. Denies nausea, diarrhea, or constipation.   Is not currently checking her blood sugar at home because the test strips are too expensive. Usually doesn't eat breakfast, just has coffee. For lunch eats a lot of white rice, noodles, and soup with some vegetables. Dinner consists of chicken or fish as a protein. 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   States she checks her feet every day. Refuses to wear anything other than flip flops. Her last foot exam was last year.   Her last mammogram was in 2014 which she reports was normal. Has never had an eye exam "because I am lazy" but states she is willing to go. Son says he will make an appointment today for her to go soon.   Denies headache, vision changes, or neuropathy. Denies polydipsia, polyphagia, or polyuria.  Does complain of a dry cough x 2 months. Has been seen twice for this cough. Was given hycodan cough medicine which helped relieve the cough as long as she was taking it but came back when she ran out. Is associated with a scratchy throat, sneezing, and a deep itchiness in both ears. States the tessalon perles were ineffective. Was prescribed a zpack at first visit which she reports "did nothing."  Denies fever, chills, rhinorhea, congestion, or sinus pressure. Of not she is currently taking lisinopril 2.5 mg daily for kidney protection.   Reports is taking Gemfibrozil BID for cholesterol.   No Known Allergies Prior to Admission medications   Medication Sig Start Date End Date Taking? Authorizing Provider  Insulin Glargine (LANTUS SOLOSTAR) 100 UNIT/ML Solostar Pen Inject 46 Units into the skin daily at 10 pm. 04/15/15  Yes Chelle Jeffery, PA-C  lisinopril (ZESTRIL) 2.5 MG tablet Take 1 tablet (2.5 mg total) by mouth daily. 04/15/15  Yes Chelle Jeffery, PA-C  metFORMIN (GLUCOPHAGE) 1000 MG tablet Take 1 tablet (1,000 mg total) by mouth 2 (two) times daily with a meal. 07/14/15  Yes Chelle Jeffery, PA-C  benzonatate (TESSALON) 100 MG capsule Take 2 capsules (200 mg total) by mouth 2 (two) times daily as needed. Patient not taking: Reported on 08/25/2015 07/23/15   Thao P Le, DO  HYDROcodone-homatropine (HYCODAN) 5-1.5 MG/5ML syrup Take 5 mLs by mouth every 8 (eight) hours as needed. Patient not taking: Reported on 08/25/2015 07/23/15   Thao P Le, DO  Insulin Pen Needle (B-D UF III MINI PEN NEEDLES) 31G X 5 MM MISC USE DAILY AS DIRECTED WITH INSULIN 04/15/15   Harrison Mons, PA-C    Review of Systems  Constitutional: Negative for fever, chills and fatigue.  HENT: Positive for sneezing, sore throat ("scratchy") and tinnitus. Negative for congestion, ear discharge, ear  pain, postnasal drip, rhinorrhea and sinus pressure.   Eyes: Negative for photophobia and discharge.  Respiratory: Positive for cough (dry). Negative for shortness of breath.   Cardiovascular: Negative for chest pain.  Gastrointestinal: Negative for nausea, vomiting, abdominal pain, diarrhea and constipation.  Endocrine: Negative for polydipsia and polyuria.  Genitourinary: Negative for dysuria and difficulty urinating.  Neurological: Negative for dizziness, light-headedness, numbness and headaches.       Objective:   Physical  Exam  Constitutional: She is oriented to person, place, and time. She appears well-developed and well-nourished. No distress.  Blood pressure 100/61, pulse 84, temperature 98.3 F (36.8 C), temperature source Oral, resp. rate 16, height 5\' 4"  (1.626 m), weight 171 lb 6.4 oz (77.747 kg), last menstrual period 08/09/2015, SpO2 98 %.   HENT:  Head: Normocephalic and atraumatic.  Right Ear: Hearing, tympanic membrane, external ear and ear canal normal.  Left Ear: Hearing, tympanic membrane, external ear and ear canal normal.  Nose: Nose normal.  Mouth/Throat: Oropharynx is clear and moist and mucous membranes are normal. No oropharyngeal exudate.  Eyes: Conjunctivae and EOM are normal. Pupils are equal, round, and reactive to light.  Neck: Normal range of motion and full passive range of motion without pain. Neck supple. No thyromegaly present.  Cardiovascular: Normal rate, regular rhythm, S1 normal, S2 normal, normal heart sounds and intact distal pulses.  Exam reveals no gallop and no friction rub.   No murmur heard. Pulses:      Radial pulses are 2+ on the right side, and 2+ on the left side.       Dorsalis pedis pulses are 2+ on the right side, and 2+ on the left side.  Pulmonary/Chest: Effort normal and breath sounds normal. She has no wheezes. She has no rhonchi. She has no rales. She exhibits no tenderness.  Musculoskeletal: Normal range of motion.  Lymphadenopathy:       Head (right side): No submental, no submandibular, no tonsillar, no preauricular, no posterior auricular and no occipital adenopathy present.       Head (left side): No submental, no submandibular, no tonsillar, no preauricular, no posterior auricular and no occipital adenopathy present.       Right: No supraclavicular adenopathy present.       Left: No supraclavicular adenopathy present.  Neurological: [name], place, and time. She has normal strength. No sensory deficit.  Reflex Scores:       Bicep reflexes are 2+ on the right side and 2+ on the left side. Skin: Skin is warm and dry.  Psychiatric: She has a normal mood and affect. Her speech is normal and behavior is normal. Judgment and thought content normal.   Diabetic Foot Exam - Simple   Simple Foot Form  Visual Inspection  No deformities, no ulcerations, no other skin breakdown bilaterally:  Yes  Sensation Testing  Intact to touch and monofilament testing bilaterally:  Yes  Pulse Check  Posterior Tibialis and Dorsalis pulse intact bilaterally:  Yes  Comments          Assessment & Plan:  1. Uncontrolled type 2 diabetes mellitus without complication, with long-term current use of insulin (HCC) - Hemoglobin A1c - Comprehensive metabolic panel - metFORMIN (GLUCOPHAGE) 1000 MG tablet; Take 1 tablet (1,000 mg total) by mouth 2 (two) times daily with a meal.  Dispense: 180 tablet; Refill: 0 - Insulin Glargine (LANTUS SOLOSTAR) 100 UNIT/ML Solostar Pen; Inject 46 Units into the skin daily at 10 pm.  Dispense: 5 pen; Refill: 3 - glipiZIDE (GLUCOTROL XL) 10 MG 24 hr tablet; Take 1 tablet (10 mg total) by mouth daily with breakfast.  Dispense: 90 tablet; Refill: 3  2. Dyslipidemia - Comprehensive metabolic panel - Lipid panel - gemfibrozil (LOPID) 600 MG tablet; Take 1 tablet (600 mg total) by mouth 2 (two) times daily before a meal.  Dispense: 180 tablet; Refill: 3  3. Cough  Will collect labs today to evaluate effectiveness of current diabetes and dyslipidemia regimen. Will continue on metformin and glipizide pending lab results. Discussed healthy eating habits and ways to substitute healthy rice/noodle options or to minimize serving size while increasing vegetable serving size. Also encouraged to exercise with a goal of 150 mins/week. Will schedule a follow up in 3 months.  Her constant dry cough could be due to lisinopril. Instructed to stop taking lisinopril and wait two weeks. If by that time she is no longer  coughing she can call us and we will start losartan for kidney protection. If she is still coughing she should come back for further evaluation including an xray.  Discussed assessment and plan with patient. They expressed their understanding and acceptance.   [place]

## 2015-08-26 ENCOUNTER — Encounter: Payer: Self-pay | Admitting: Physician Assistant

## 2015-08-26 LAB — LIPID PANEL
CHOL/HDL RATIO: 2.7 ratio (ref <=5.0)
Cholesterol: 71 mg/dL — ABNORMAL LOW (ref 125–200)
HDL: 26 mg/dL — ABNORMAL LOW (ref >=46)
LDL Cholesterol: 30 mg/dL (ref <130)
Triglycerides: 75 mg/dL (ref <150)
VLDL: 15 mg/dL (ref <30)

## 2015-08-26 LAB — COMPREHENSIVE METABOLIC PANEL
ALT: 27 U/L (ref 6–29)
AST: 30 U/L (ref 10–35)
Albumin: 4.3 g/dL (ref 3.6–5.1)
Alkaline Phosphatase: 57 U/L (ref 33–115)
BUN: 14 mg/dL (ref 7–25)
CHLORIDE: 100 mmol/L (ref 98–110)
CO2: 24 mmol/L (ref 20–31)
Calcium: 9.8 mg/dL (ref 8.6–10.2)
Creat: 0.51 mg/dL (ref 0.50–1.10)
GLUCOSE: 56 mg/dL — AB (ref 65–99)
POTASSIUM: 4 mmol/L (ref 3.5–5.3)
Sodium: 135 mmol/L (ref 135–146)
Total Bilirubin: 0.4 mg/dL (ref 0.2–1.2)
Total Protein: 8 g/dL (ref 6.1–8.1)

## 2015-08-26 LAB — HEMOGLOBIN A1C
HEMOGLOBIN A1C: 8.3 % — AB (ref <5.7)
MEAN PLASMA GLUCOSE: 192 mg/dL — AB (ref <117)

## 2015-08-27 NOTE — Progress Notes (Signed)
Patient ID: Shelley Escobar, female    DOB: [birth year], 45 y.o.   MRN: [id number]  PCP: Ellsworth Lennox, MD  Subjective:   Chief Complaint  Patient presents with  . Follow-up    diabetes  . Medication Refill    metformin, lantus(SOLOSTAR) and lisinopril    HPI Presents for evaluation of diabetes. Her son, Shelley Escobar, accompanies her and translates.   She was diagnosed with diabetes about 15 years ago. Is on 46U of insulin daily in addition to several oral medications. Was taking metformin and was switched to Janumet. Called in January because the Janumet became too expensive. She was told to restart metformin 1000mg  BID and start glipizide 10 XL mg daily. Reports no problems with the medications at this time. Denies nausea, diarrhea, or constipation.   Is not currently checking her blood sugar at home because the test strips are too expensive. Usually doesn't eat breakfast, just has coffee. For lunch eats a lot of white rice, noodles, and soup with some vegetables. Dinner consists of chicken or fish as a protein. 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   States she checks her feet every day. Refuses to wear anything other than flip flops. Her last foot exam was last year.   Her last mammogram was in 2014 which she reports was normal. Has never had an eye exam "because I am lazy" but states she is willing to go. Son says he will make an appointment today for her to go soon.   Denies headache, vision changes, or neuropathy. Denies polydipsia, polyphagia, or polyuria.  Does complain of a dry cough x 2 months. Has been seen twice for this cough. Was given hycodan cough medicine which helped relieve the cough as long as she was taking it but came back when she ran out. Is associated with a scratchy throat, sneezing, and a deep itchiness in both ears. States the tessalon perles were ineffective. Was prescribed a zpack at first visit  which she reports "did nothing." Denies fever, chills, rhinorhea, congestion, or sinus pressure. Of note, she is currently taking lisinopril 2.5 mg daily for kidney protection.   Reports is taking Gemfibrozil BID for cholesterol, which is not on her medication list.   Review of Systems Constitutional: Negative for fever, chills and fatigue.  HENT: Positive for sneezing, sore throat ("scratchy") and tinnitus. Negative for congestion, ear discharge, ear pain, postnasal drip, rhinorrhea and sinus pressure.  Eyes: Negative for photophobia and discharge.  Respiratory: Positive for cough (dry). Negative for shortness of breath.  Cardiovascular: Negative for chest pain.  Gastrointestinal: Negative for nausea, vomiting, abdominal pain, diarrhea and constipation.  Endocrine: Negative for polydipsia and polyuria.  Genitourinary: Negative for dysuria and difficulty urinating.  Neurological: Negative for dizziness, light-headedness, numbness and headaches.     Patient Active Problem List   Diagnosis Date Noted  . Obesity (BMI 30.0-34.9) 01/27/2012  . Diabetes mellitus type 2, uncontrolled, without complications ([place]) [id number]  . Dyslipidemia 07/02/2011     Prior to Admission medications   Medication Sig Start Date End Date Taking? Authorizing Provider  gemfibrozil (LOPID) 600 MG tablet Take 1 tablet (600 mg total) by mouth 2 (two) times daily before a meal. 08/25/15  Yes Yitta Gongaware, PA-C  glipiZIDE (GLUCOTROL XL) 10 MG 24 hr tablet Take 1 tablet (10 mg total) by mouth daily with breakfast. 08/25/15  Yes Galan Ghee, PA-C  Insulin Glargine (LANTUS SOLOSTAR) 100  UNIT/ML Solostar Pen Inject 46 Units into the skin daily at 10 pm. 08/25/15  Yes Tramane Gorum, PA-C  Insulin Pen Needle (B-D UF III MINI PEN NEEDLES) 31G X 5 MM MISC USE DAILY AS DIRECTED WITH INSULIN 04/15/15  Yes Wolfgang Finigan, PA-C  lisinopril (ZESTRIL) 2.5 MG tablet Take 1 tablet (2.5 mg total) by mouth daily. 04/15/15  Yes  Latysha Thackston, PA-C  metFORMIN (GLUCOPHAGE) 1000 MG tablet Take 1 tablet (1,000 mg total) by mouth 2 (two) times daily with a meal. 08/25/15  Yes Kazden Largo, PA-C     No Known Allergies     Objective:  Physical Exam  Constitutional: She is oriented to person, place, and time. She appears well-developed and well-nourished. She is active and cooperative. No distress.  BP 100/61 mmHg  Pulse 84  Temp(Src) 98.3 F (36.8 C) (Oral)  Resp 16  Ht 5\' 4"  (1.626 m)  Wt 171 lb 6.4 oz (77.747 kg)  BMI 29.41 kg/m2  SpO2 98%  LMP 08/09/2015  HENT:  Head: Normocephalic and atraumatic.  Right Ear: Hearing normal.  Left Ear: Hearing normal.  Eyes: Conjunctivae are normal. No scleral icterus.  Neck: Normal range of motion. Neck supple. No thyromegaly present.  Cardiovascular: Normal rate, regular rhythm and normal heart sounds.   Pulses:      Radial pulses are 2+ on the right side, and 2+ on the left side.  Pulmonary/Chest: Effort normal and breath sounds normal.  Lymphadenopathy:       Head (right side): No tonsillar, no preauricular, no posterior auricular and no occipital adenopathy present.       Head (left side): No tonsillar, no preauricular, no posterior auricular and no occipital adenopathy present.    She has no cervical adenopathy.       Right: No supraclavicular adenopathy present.       Left: No supraclavicular adenopathy present.  Neurological: [name], place, and time. No sensory deficit.  Skin: Skin is warm, dry and intact. No rash noted. No cyanosis or erythema. Nails show no clubbing.  Psychiatric: She has a normal mood and affect. Her speech is normal and behavior is normal.   Diabetic Foot Exam - Simple   Simple Foot Form  Visual Inspection  No deformities, no ulcerations, no other skin breakdown bilaterally:  Yes  Sensation Testing  Intact to touch and monofilament testing bilaterally:  Yes  Pulse Check  Posterior Tibialis and Dorsalis  pulse intact bilaterally:  Yes  Comments             Assessment & Plan:   1. Uncontrolled type 2 diabetes mellitus without complication, with long-term current use of insulin ([place]) Await A1C results. Adjust regimen as indicated. Discussed the need to reduce the starches in her diet. - Hemoglobin A1c - Comprehensive metabolic panel - metFORMIN (GLUCOPHAGE) 1000 MG tablet; Take 1 tablet (1,000 mg total) by mouth 2 (two) times daily with a meal.  Dispense: 180 tablet; Refill: 0 - Insulin Glargine (LANTUS SOLOSTAR) 100 UNIT/ML Solostar Pen; Inject 46 Units into the skin daily at 10 pm.  Dispense: 5 pen; Refill: 3 - glipiZIDE (GLUCOTROL XL) 10 MG 24 hr tablet; Take 1 tablet (10 mg total) by mouth daily with breakfast.  Dispense: 90 tablet; Refill: 3  2. Dyslipidemia Await lab results. Continue current treatment. Likely needs statin therapy. - Comprehensive metabolic panel - Lipid panel - gemfibrozil (LOPID) 600 MG tablet; Take 1 tablet (600 mg total) by mouth 2 (two)  times daily before a meal.  Dispense: 180 tablet; Refill: 3  3. Cough Possibly due to ACEI. Ask that she stop the lisinopril x 2 weeks. If cough resolves, contact me. I will plan to start low dose losartan. If the cough persists, RTC for additional evaluation, including CXR.    Return in about 3 months (around 11/25/2015).    Fara Chute, PA-C Physician Assistant-Certified [facility]

## 2015-08-29 ENCOUNTER — Encounter: Payer: Self-pay | Admitting: Physician Assistant

## 2015-10-16 ENCOUNTER — Other Ambulatory Visit (HOSPITAL_BASED_OUTPATIENT_CLINIC_OR_DEPARTMENT_OTHER): Payer: Self-pay

## 2015-10-16 DIAGNOSIS — Z1231 Encounter for screening mammogram for malignant neoplasm of breast: Secondary | ICD-10-CM

## 2015-11-05 ENCOUNTER — Ambulatory Visit: Payer: Self-pay

## 2015-11-24 ENCOUNTER — Ambulatory Visit (INDEPENDENT_AMBULATORY_CARE_PROVIDER_SITE_OTHER): Payer: BLUE CROSS/BLUE SHIELD | Admitting: Physician Assistant

## 2015-11-24 ENCOUNTER — Encounter: Payer: Self-pay | Admitting: Physician Assistant

## 2015-11-24 VITALS — BP 111/75 | HR 80 | Temp 97.6°F | Resp 16 | Ht 63.75 in | Wt 172.0 lb

## 2015-11-24 DIAGNOSIS — Z6829 Body mass index (BMI) 29.0-29.9, adult: Secondary | ICD-10-CM

## 2015-11-24 DIAGNOSIS — E1165 Type 2 diabetes mellitus with hyperglycemia: Secondary | ICD-10-CM

## 2015-11-24 DIAGNOSIS — IMO0001 Reserved for inherently not codable concepts without codable children: Secondary | ICD-10-CM

## 2015-11-24 DIAGNOSIS — E785 Hyperlipidemia, unspecified: Secondary | ICD-10-CM | POA: Diagnosis not present

## 2015-11-24 DIAGNOSIS — Z794 Long term (current) use of insulin: Secondary | ICD-10-CM | POA: Diagnosis not present

## 2015-11-24 LAB — COMPREHENSIVE METABOLIC PANEL
ALBUMIN: 4.2 g/dL (ref 3.6–5.1)
ALK PHOS: 65 U/L (ref 33–115)
ALT: 44 U/L — AB (ref 6–29)
AST: 52 U/L — AB (ref 10–35)
BILIRUBIN TOTAL: 0.5 mg/dL (ref 0.2–1.2)
BUN: 15 mg/dL (ref 7–25)
CO2: 24 mmol/L (ref 20–31)
CREATININE: 0.54 mg/dL (ref 0.50–1.10)
Calcium: 9.8 mg/dL (ref 8.6–10.2)
Chloride: 99 mmol/L (ref 98–110)
Glucose, Bld: 169 mg/dL — ABNORMAL HIGH (ref 65–99)
Potassium: 4.3 mmol/L (ref 3.5–5.3)
SODIUM: 132 mmol/L — AB (ref 135–146)
TOTAL PROTEIN: 7.8 g/dL (ref 6.1–8.1)

## 2015-11-24 LAB — LIPID PANEL
CHOLESTEROL: 75 mg/dL — AB (ref 125–200)
HDL: 25 mg/dL — ABNORMAL LOW (ref >=46)
LDL Cholesterol: 34 mg/dL (ref <130)
TRIGLYCERIDES: 78 mg/dL (ref <150)
Total CHOL/HDL Ratio: 3 Ratio (ref <=5.0)
VLDL: 16 mg/dL (ref <30)

## 2015-11-24 MED ORDER — METFORMIN HCL 1000 MG PO TABS: 1000.0000 mg | ORAL_TABLET | Freq: Two times a day (BID) | ORAL | Status: DC

## 2015-11-24 NOTE — Progress Notes (Signed)
Patient ID: Shelley Escobar, female    DOB: 1971/01/09, 44 y.o.   MRN: [id number]  PCP: Wynne Dust  Subjective:   Chief Complaint  Patient presents with  . Follow-up  . Diabetes  . Medication Refill    all meds    HPI Presents for evaluation of diabetes. She is accompanied by her son, who helps with translation.  She reports that she feels well, is tolerating her medications without difficulty. She does not check her glucose at home.  At her last visit, we planned to stop the ACEI to see if the cough she was experiencing would resolve. There was some confusion around that, and she continues the lisinopril, but her cough has resolved.    Review of Systems  Constitutional: Negative for activity change, appetite change, fatigue and unexpected weight change.  HENT: Negative for congestion, dental problem, ear pain, hearing loss, mouth sores, postnasal drip, rhinorrhea, sneezing, sore throat, tinnitus and trouble swallowing.   Eyes: Negative for photophobia, pain, redness and visual disturbance.  Respiratory: Negative for cough, chest tightness and shortness of breath.   Cardiovascular: Negative for chest pain, palpitations and leg swelling.  Gastrointestinal: Negative for nausea, vomiting, abdominal pain, diarrhea, constipation and blood in stool.  Genitourinary: Negative for dysuria, urgency, frequency and hematuria.  Musculoskeletal: Negative for myalgias, arthralgias, gait problem and neck stiffness.  Skin: Negative for rash.  Neurological: Negative for dizziness, speech difficulty, weakness, light-headedness, numbness and headaches.  Hematological: Negative for adenopathy.  Psychiatric/Behavioral: Negative for confusion and sleep disturbance. The patient is not nervous/anxious.        Patient Active Problem List   Diagnosis Date Noted  . Obesity (BMI 30.0-34.9) 01/27/2012  . Diabetes mellitus type 2, uncontrolled, without complications ([place]) [id number]  .  Dyslipidemia 07/02/2011     Prior to Admission medications   Medication Sig Start Date End Date Taking? Authorizing Provider  gemfibrozil (LOPID) 600 MG tablet Take 1 tablet (600 mg total) by mouth 2 (two) times daily before a meal. 08/25/15  Yes Zeniya Lapidus, PA-C  glipiZIDE (GLUCOTROL XL) 10 MG 24 hr tablet Take 1 tablet (10 mg total) by mouth daily with breakfast. 08/25/15  Yes Parrish Daddario, PA-C  Insulin Glargine (LANTUS SOLOSTAR) 100 UNIT/ML Solostar Pen Inject 50 Units into the skin daily at 10 pm. 08/25/15  Yes Marisela Line, PA-C  Insulin Pen Needle (B-D UF III MINI PEN NEEDLES) 31G X 5 MM MISC USE DAILY AS DIRECTED WITH INSULIN 04/15/15  Yes Aaryan Essman, PA-C  lisinopril (ZESTRIL) 2.5 MG tablet Take 1 tablet (2.5 mg total) by mouth daily. 04/15/15  Yes Andrian Sabala, PA-C  metFORMIN (GLUCOPHAGE) 1000 MG tablet Take 1 tablet (1,000 mg total) by mouth 2 (two) times daily with a meal. 08/25/15  Yes Uriel Horkey, PA-C     No Known Allergies     Objective:  Physical Exam  Constitutional: She is oriented to person, place, and time. She appears well-developed and well-nourished. No distress.  BP 111/75 mmHg  Pulse 80  Temp(Src) 97.6 F (36.4 C) (Oral)  Resp 16  Ht 5' 3.75" (1.619 m)  Wt 172 lb (78.019 kg)  BMI 29.77 kg/m2  SpO2 98%  LMP 11/06/2015   Eyes: Conjunctivae are normal. No scleral icterus.  Neck: Neck supple. No thyromegaly present.  Cardiovascular: Normal rate, regular rhythm, normal heart sounds and intact distal pulses.   Pulmonary/Chest: Effort normal and breath sounds normal.  Lymphadenopathy:    She has no cervical adenopathy.  Neurological: She  is alert and oriented to person, place, and time.  Skin: Skin is warm and dry.  Psychiatric: She has a normal mood and affect. Her speech is normal and behavior is normal.           Assessment & Plan:   1. Uncontrolled type 2 diabetes mellitus without complication, with long-term current use of insulin  ([place]) Await lab results. Will adjust insulin dose if needed. Encouraged healthy lifestyle modification, including healthy eating choices and regular cardiovascular exercise. - HM Diabetes Foot Exam - Comprehensive metabolic panel - metFORMIN (GLUCOPHAGE) 1000 MG tablet; Take 1 tablet (1,000 mg total) by mouth 2 (two) times daily with a meal.  Dispense: 180 tablet; Refill: 0 - Hemoglobin A1c  2. Dyslipidemia Await lab results. Statin is indicated due to diabetes. - Lipid panel  3. BMI 29.0-29.9,adult Healthy lifestyle modifications.   Fara Chute, PA-C Physician Assistant-Certified [facility]

## 2015-11-24 NOTE — Progress Notes (Signed)
Subjective:    Patient ID: Shelley Escobar, female    DOB: 01-10-1971, 45 y.o.   MRN: [id number] Chief Complaint  Patient presents with  . Follow-up  . Diabetes  . Medication Refill    all meds    HPI  Shelley Escobar is a 45 yo female who presents today for routine diabetes follow up, asymptomatic with no complaints, with her son serving as her interpretor.  Patient is an insulin dependent type 2 diabetic diagnosed about 15 years ago.  Following lab results of her last appointment, Lantus dosage was increased to 50 U daily and has been tolerated well.  Patient also taking 1000mg  metformin BID and 10mg  glipizide daily.  Patient does not self test at home.  Walks daily in the morning and avoids carbs when possible.    Last visit patient complained of cough with unknown etiology, instructed patient stop lisinopril to rule out as cause.  Patient states cough resolved on its own within a couple weeks, patient continued daily lisinopril without interruption.  Denies abdominal pain, chest pain, SOB, numbness of hands or feet, neuropathy, vision changes, polyuria, polydipsia or weight change.  Review of Systems All others negative except those listed in HPI.  Patient Active Problem List   Diagnosis Date Noted  . BMI 29.0-29.9,adult 11/24/2015  . Obesity (BMI 30.0-34.9) 01/27/2012  . Diabetes mellitus type 2, uncontrolled, without complications ([place]) [id number]  . Dyslipidemia 07/02/2011    Current Outpatient Prescriptions on File Prior to Visit  Medication Sig Dispense Refill  . gemfibrozil (LOPID) 600 MG tablet Take 1 tablet (600 mg total) by mouth 2 (two) times daily before a meal. 180 tablet 3  . glipiZIDE (GLUCOTROL XL) 10 MG 24 hr tablet Take 1 tablet (10 mg total) by mouth daily with breakfast. 90 tablet 3  . Insulin Glargine (LANTUS SOLOSTAR) 100 UNIT/ML Solostar Pen Inject 46 Units into the skin daily at 10 pm. 5 pen 3  . Insulin Pen Needle (B-D UF III MINI PEN NEEDLES) 31G X 5 MM  MISC USE DAILY AS DIRECTED WITH INSULIN 100 each 3  . lisinopril (ZESTRIL) 2.5 MG tablet Take 1 tablet (2.5 mg total) by mouth daily. 90 tablet 3   No current facility-administered medications on file prior to visit.    No Known Allergies     Objective: BP 111/75 mmHg  Pulse 80  Temp(Src) 97.6 F (36.4 C) (Oral)  Resp 16  Ht 5' 3.75" (1.619 m)  Wt 172 lb (78.019 kg)  BMI 29.77 kg/m2  SpO2 98%  LMP 11/06/2015   Physical Exam  Constitutional: She is oriented to person, place, and time. She appears well-developed and well-nourished.  Neck: Normal range of motion. Neck supple. No thyromegaly present.  Cardiovascular: Normal rate, regular rhythm and normal heart sounds.   Pulses:      Radial pulses are 2+ on the right side, and 2+ on the left side.       Dorsalis pedis pulses are 1+ on the right side, and 1+ on the left side.       Posterior tibial pulses are 1+ on the right side, and 1+ on the left side.  Pulmonary/Chest: Effort normal and breath sounds normal.  Neurological: [name], place, and time.  Skin: Skin is warm and dry.  Psychiatric: She has a normal mood and affect. Her behavior is normal. Judgment and thought content normal.       Assessment & Plan:  1.  Uncontrolled type 2 diabetes mellitus without complication, with long-term current use of insulin ([place]) Lab results pending, will adjust medication regimen if indicated by results. - HM Diabetes Foot Exam performed - Comprehensive metabolic panel - metFORMIN (GLUCOPHAGE) 1000 MG tablet; Take 1 tablet (1,000 mg total) by mouth 2 (two) times daily with a meal.  Dispense: 180 tablet; Refill: 0 - Hemoglobin A1c  2. Dyslipidemia Labs pending, continue current medical regimen, will adjust as indicated by results. - Lipid panel  3. BMI 29.0-29.9,adult Patient encouraged to exercise 27min/day, 5 days a week, as well as maintaining a low carb diet, with lots of vegetables and good healthy  fats.  Patient and family informed she has refers available for all her medications at the pharmacy, she needs to just let them know which medications she needs to pick up. Instructed to follow-up in 3-4 months and if a question or concern develops in the interval, contact the office.  Shoni Quijas P. Jeffree Cazeau, PA-S

## 2015-11-24 NOTE — Patient Instructions (Addendum)
Your have refills available at the pharmacy for all your medications. Please contact the pharmacy to let them know that you need to pick up the medications.    IF you received an x-ray today, you will receive an invoice from [facility]. Please contact [facility] at [phone] with questions or concerns regarding your invoice.   IF you received labwork today, you will receive an invoice from [employer]. Please contact [facility] at [phone] with questions or concerns regarding your invoice.   Our billing staff will not be able to assist you with questions regarding bills from these companies.  You will be contacted with the lab results as soon as they are available. The fastest way to get your results is to activate your My Chart account. Instructions are located on the last page of this paperwork. If you have not heard from [place] regarding the results in 2 weeks, please contact this office.

## 2015-11-27 ENCOUNTER — Encounter: Payer: Self-pay | Admitting: Physician Assistant

## 2016-01-12 ENCOUNTER — Observation Stay (HOSPITAL_COMMUNITY)
Admission: EM | Admit: 2016-01-12 | Discharge: 2016-01-14 | Disposition: A | Discharge status: Home / Self Care | Payer: BLUE CROSS/BLUE SHIELD | Attending: General Surgery | Admitting: General Surgery

## 2016-01-12 ENCOUNTER — Encounter (HOSPITAL_COMMUNITY): Payer: Self-pay

## 2016-01-12 ENCOUNTER — Emergency Department (HOSPITAL_COMMUNITY): Payer: BLUE CROSS/BLUE SHIELD

## 2016-01-12 DIAGNOSIS — Z6828 Body mass index (BMI) 28.0-28.9, adult: Secondary | ICD-10-CM | POA: Insufficient documentation

## 2016-01-12 DIAGNOSIS — E119 Type 2 diabetes mellitus without complications: Secondary | ICD-10-CM | POA: Diagnosis not present

## 2016-01-12 DIAGNOSIS — I10 Essential (primary) hypertension: Secondary | ICD-10-CM | POA: Diagnosis not present

## 2016-01-12 DIAGNOSIS — Z794 Long term (current) use of insulin: Secondary | ICD-10-CM | POA: Insufficient documentation

## 2016-01-12 DIAGNOSIS — E669 Obesity, unspecified: Secondary | ICD-10-CM | POA: Insufficient documentation

## 2016-01-12 DIAGNOSIS — K37 Unspecified appendicitis: Secondary | ICD-10-CM | POA: Diagnosis present

## 2016-01-12 DIAGNOSIS — K3580 Unspecified acute appendicitis: Principal | ICD-10-CM | POA: Insufficient documentation

## 2016-01-12 LAB — COMPREHENSIVE METABOLIC PANEL
ALBUMIN: 4.3 g/dL (ref 3.5–5.0)
ALK PHOS: 59 U/L (ref 38–126)
ALT: 55 U/L — ABNORMAL HIGH (ref 14–54)
ANION GAP: 6 (ref 5–15)
AST: 59 U/L — ABNORMAL HIGH (ref 15–41)
BUN: 12 mg/dL (ref 6–20)
CALCIUM: 9.7 mg/dL (ref 8.9–10.3)
CHLORIDE: 102 mmol/L (ref 101–111)
CO2: 28 mmol/L (ref 22–32)
Creatinine, Ser: 0.56 mg/dL (ref 0.44–1.00)
GFR calc Af Amer: >60 mL/min (ref >60)
GFR calc non Af Amer: >60 mL/min (ref >60)
GLUCOSE: 217 mg/dL — AB (ref 65–99)
POTASSIUM: 4 mmol/L (ref 3.5–5.1)
SODIUM: 136 mmol/L (ref 135–145)
Total Bilirubin: 0.7 mg/dL (ref 0.3–1.2)
Total Protein: 8.2 g/dL — ABNORMAL HIGH (ref 6.5–8.1)

## 2016-01-12 LAB — CBC
HEMATOCRIT: 34.9 % — AB (ref 36.0–46.0)
HEMOGLOBIN: 11.2 g/dL — AB (ref 12.0–15.0)
MCH: 24.1 pg — AB (ref 26.0–34.0)
MCHC: 32.1 g/dL (ref 30.0–36.0)
MCV: 75.1 fL — AB (ref 78.0–100.0)
Platelets: 342 10*3/uL (ref 150–400)
RBC: 4.65 MIL/uL (ref 3.87–5.11)
RDW: 13.3 % (ref 11.5–15.5)
WBC: 11.1 10*3/uL — ABNORMAL HIGH (ref 4.0–10.5)

## 2016-01-12 LAB — PREGNANCY, URINE: Preg Test, Ur: NEGATIVE

## 2016-01-12 LAB — URINALYSIS, ROUTINE W REFLEX MICROSCOPIC
BILIRUBIN URINE: NEGATIVE
GLUCOSE, UA: NEGATIVE mg/dL
HGB URINE DIPSTICK: NEGATIVE
Ketones, ur: NEGATIVE mg/dL
Leukocytes, UA: NEGATIVE
Nitrite: NEGATIVE
PH: 6.5 (ref 5.0–8.0)
Protein, ur: NEGATIVE mg/dL
SPECIFIC GRAVITY, URINE: 1.021 (ref 1.005–1.030)

## 2016-01-12 LAB — LIPASE, BLOOD: LIPASE: 33 U/L (ref 11–51)

## 2016-01-12 MED ORDER — HYDROMORPHONE HCL 1 MG/ML IJ SOLN
0.5000 mg | Freq: Once | INTRAMUSCULAR | Status: AC
  Administered 2016-01-12: 0.5 mg via INTRAVENOUS
  Filled 2016-01-12: qty 1

## 2016-01-12 MED ORDER — SODIUM CHLORIDE 0.9 % IV BOLUS (SEPSIS)
1000.0000 mL | Freq: Once | INTRAVENOUS | Status: AC
  Administered 2016-01-12: 1000 mL via INTRAVENOUS

## 2016-01-12 MED ORDER — IOPAMIDOL (ISOVUE-300) INJECTION 61%
100.0000 mL | Freq: Once | INTRAVENOUS | Status: AC | PRN
  Administered 2016-01-12: 100 mL via INTRAVENOUS

## 2016-01-12 NOTE — ED Triage Notes (Signed)
PT C/O GENERALIZED ABDOMINAL PAIN SINCE 3PM TODAY. DENIES N/V/D OR FEVER.

## 2016-01-13 ENCOUNTER — Encounter (HOSPITAL_COMMUNITY): Payer: Self-pay | Admitting: Anesthesiology

## 2016-01-13 ENCOUNTER — Encounter (HOSPITAL_COMMUNITY)
Admission: EM | Disposition: A | Discharge status: Home / Self Care | Payer: Self-pay | Source: Home / Self Care | Attending: Emergency Medicine

## 2016-01-13 ENCOUNTER — Observation Stay (HOSPITAL_COMMUNITY): Payer: BLUE CROSS/BLUE SHIELD | Admitting: Anesthesiology

## 2016-01-13 DIAGNOSIS — K3580 Unspecified acute appendicitis: Secondary | ICD-10-CM | POA: Diagnosis present

## 2016-01-13 DIAGNOSIS — K37 Unspecified appendicitis: Secondary | ICD-10-CM | POA: Diagnosis present

## 2016-01-13 HISTORY — PX: LAPAROSCOPIC APPENDECTOMY: SHX408

## 2016-01-13 HISTORY — DX: Unspecified acute appendicitis: K35.80

## 2016-01-13 LAB — GLUCOSE, CAPILLARY
GLUCOSE-CAPILLARY: 193 mg/dL — AB (ref 65–99)
GLUCOSE-CAPILLARY: 291 mg/dL — AB (ref 65–99)
Glucose-Capillary: 132 mg/dL — ABNORMAL HIGH (ref 65–99)
Glucose-Capillary: 198 mg/dL — ABNORMAL HIGH (ref 65–99)
Glucose-Capillary: 206 mg/dL — ABNORMAL HIGH (ref 65–99)
Glucose-Capillary: 300 mg/dL — ABNORMAL HIGH (ref 65–99)

## 2016-01-13 LAB — SURGICAL PCR SCREEN
MRSA, PCR: NEGATIVE
Staphylococcus aureus: POSITIVE — AB

## 2016-01-13 SURGERY — APPENDECTOMY, LAPAROSCOPIC
Anesthesia: General

## 2016-01-13 MED ORDER — HYDROMORPHONE HCL 1 MG/ML IJ SOLN: 0.2500 mg | INTRAMUSCULAR | Status: DC | PRN

## 2016-01-13 MED ORDER — FAMOTIDINE IN NACL 20-0.9 MG/50ML-% IV SOLN
20.0000 mg | Freq: Two times a day (BID) | INTRAVENOUS | Status: DC
Start: 2016-01-13 — End: 2016-01-14
  Administered 2016-01-13 (×3): 20 mg via INTRAVENOUS
  Filled 2016-01-13 (×4): qty 50

## 2016-01-13 MED ORDER — HEPARIN SODIUM (PORCINE) 5000 UNIT/ML IJ SOLN
5000.0000 [IU] | Freq: Once | INTRAMUSCULAR | Status: AC
  Administered 2016-01-13: 5000 [IU] via SUBCUTANEOUS
  Filled 2016-01-13: qty 1

## 2016-01-13 MED ORDER — INSULIN ASPART 100 UNIT/ML ~~LOC~~ SOLN
0.0000 [IU] | SUBCUTANEOUS | Status: DC
  Administered 2016-01-13: 3 [IU] via SUBCUTANEOUS
  Administered 2016-01-13: 11 [IU] via SUBCUTANEOUS
  Administered 2016-01-13 – 2016-01-14 (×3): 4 [IU] via SUBCUTANEOUS
  Administered 2016-01-14: 11 [IU] via SUBCUTANEOUS

## 2016-01-13 MED ORDER — EPHEDRINE SULFATE 50 MG/ML IJ SOLN
INTRAMUSCULAR | Status: AC
  Filled 2016-01-13: qty 1

## 2016-01-13 MED ORDER — ROCURONIUM BROMIDE 100 MG/10ML IV SOLN
INTRAVENOUS | Status: DC | PRN
  Administered 2016-01-13: 40 mg via INTRAVENOUS

## 2016-01-13 MED ORDER — HYDROMORPHONE HCL 1 MG/ML IJ SOLN
1.0000 mg | Freq: Once | INTRAMUSCULAR | Status: AC
  Administered 2016-01-13: 1 mg via INTRAVENOUS
  Filled 2016-01-13: qty 1

## 2016-01-13 MED ORDER — MUPIROCIN 2 % EX OINT
1.0000 "application " | TOPICAL_OINTMENT | Freq: Two times a day (BID) | CUTANEOUS | Status: DC
  Administered 2016-01-13: 1 via NASAL

## 2016-01-13 MED ORDER — PHENYLEPHRINE 40 MCG/ML (10ML) SYRINGE FOR IV PUSH (FOR BLOOD PRESSURE SUPPORT)
PREFILLED_SYRINGE | INTRAVENOUS | Status: DC | PRN
  Administered 2016-01-13: 120 ug via INTRAVENOUS

## 2016-01-13 MED ORDER — LACTATED RINGERS IR SOLN
Status: DC | PRN
  Administered 2016-01-13: 1000 mL

## 2016-01-13 MED ORDER — POTASSIUM CHLORIDE IN NACL 20-0.45 MEQ/L-% IV SOLN
INTRAVENOUS | Status: DC
  Administered 2016-01-13: 1000 mL via INTRAVENOUS
  Filled 2016-01-13 (×2): qty 1000

## 2016-01-13 MED ORDER — ONDANSETRON HCL 4 MG/2ML IJ SOLN
INTRAMUSCULAR | Status: DC | PRN
  Administered 2016-01-13: 4 mg via INTRAVENOUS

## 2016-01-13 MED ORDER — FENTANYL CITRATE (PF) 250 MCG/5ML IJ SOLN
INTRAMUSCULAR | Status: AC
  Filled 2016-01-13: qty 5

## 2016-01-13 MED ORDER — SUGAMMADEX SODIUM 200 MG/2ML IV SOLN
INTRAVENOUS | Status: DC | PRN
  Administered 2016-01-13: 200 mg via INTRAVENOUS

## 2016-01-13 MED ORDER — DIPHENHYDRAMINE HCL 50 MG/ML IJ SOLN: 12.5000 mg | Freq: Four times a day (QID) | INTRAMUSCULAR | Status: DC | PRN

## 2016-01-13 MED ORDER — MORPHINE SULFATE (PF) 2 MG/ML IV SOLN
1.0000 mg | INTRAVENOUS | Status: DC | PRN
  Administered 2016-01-13 (×2): 2 mg via INTRAVENOUS
  Filled 2016-01-13 (×2): qty 1

## 2016-01-13 MED ORDER — PROPOFOL 10 MG/ML IV BOLUS
INTRAVENOUS | Status: DC | PRN
  Administered 2016-01-13: 160 mg via INTRAVENOUS

## 2016-01-13 MED ORDER — PROMETHAZINE HCL 25 MG/ML IJ SOLN: 6.2500 mg | INTRAMUSCULAR | Status: DC | PRN

## 2016-01-13 MED ORDER — PROMETHAZINE HCL 25 MG/ML IJ SOLN
12.5000 mg | Freq: Four times a day (QID) | INTRAMUSCULAR | Status: DC | PRN
  Filled 2016-01-13: qty 1

## 2016-01-13 MED ORDER — MIDAZOLAM HCL 5 MG/5ML IJ SOLN
INTRAMUSCULAR | Status: DC | PRN
  Administered 2016-01-13: 2 mg via INTRAVENOUS

## 2016-01-13 MED ORDER — LACTATED RINGERS IV SOLN: INTRAVENOUS | Status: DC

## 2016-01-13 MED ORDER — DEXAMETHASONE SODIUM PHOSPHATE 10 MG/ML IJ SOLN
INTRAMUSCULAR | Status: DC | PRN
  Administered 2016-01-13: 10 mg via INTRAVENOUS

## 2016-01-13 MED ORDER — SUCCINYLCHOLINE CHLORIDE 200 MG/10ML IV SOSY
PREFILLED_SYRINGE | INTRAVENOUS | Status: DC | PRN
  Administered 2016-01-13: 100 mg via INTRAVENOUS

## 2016-01-13 MED ORDER — BUPIVACAINE-EPINEPHRINE 0.5% -1:200000 IJ SOLN
INTRAMUSCULAR | Status: DC | PRN
  Administered 2016-01-13: 20 mL

## 2016-01-13 MED ORDER — CHLORHEXIDINE GLUCONATE CLOTH 2 % EX PADS
6.0000 | MEDICATED_PAD | Freq: Every day | CUTANEOUS | Status: DC
  Administered 2016-01-13: 6 via TOPICAL

## 2016-01-13 MED ORDER — METRONIDAZOLE IN NACL 5-0.79 MG/ML-% IV SOLN
500.0000 mg | Freq: Three times a day (TID) | INTRAVENOUS | Status: DC
  Administered 2016-01-13 – 2016-01-14 (×3): 500 mg via INTRAVENOUS
  Filled 2016-01-13 (×5): qty 100

## 2016-01-13 MED ORDER — DEXTROSE 5 % IV SOLN
2.0000 g | Freq: Once | INTRAVENOUS | Status: AC
  Administered 2016-01-13: 2 g via INTRAVENOUS
  Filled 2016-01-13: qty 2

## 2016-01-13 MED ORDER — LIDOCAINE HCL (CARDIAC) 20 MG/ML IV SOLN
INTRAVENOUS | Status: DC | PRN
  Administered 2016-01-13: 50 mg via INTRAVENOUS

## 2016-01-13 MED ORDER — SCOPOLAMINE 1 MG/3DAYS TD PT72: 1.0000 | MEDICATED_PATCH | TRANSDERMAL | Status: DC

## 2016-01-13 MED ORDER — BUPIVACAINE-EPINEPHRINE (PF) 0.5% -1:200000 IJ SOLN
INTRAMUSCULAR | Status: AC
  Filled 2016-01-13: qty 30

## 2016-01-13 MED ORDER — 0.9 % SODIUM CHLORIDE (POUR BTL) OPTIME
TOPICAL | Status: DC | PRN
  Administered 2016-01-13: 1000 mL

## 2016-01-13 MED ORDER — MIDAZOLAM HCL 2 MG/2ML IJ SOLN
INTRAMUSCULAR | Status: AC
  Filled 2016-01-13: qty 2

## 2016-01-13 MED ORDER — ONDANSETRON HCL 4 MG/2ML IJ SOLN: 4.0000 mg | Freq: Four times a day (QID) | INTRAMUSCULAR | Status: DC | PRN

## 2016-01-13 MED ORDER — LACTATED RINGERS IV SOLN
INTRAVENOUS | Status: DC | PRN
  Administered 2016-01-13 (×2): via INTRAVENOUS

## 2016-01-13 MED ORDER — ONDANSETRON 4 MG PO TBDP: 4.0000 mg | ORAL_TABLET | Freq: Four times a day (QID) | ORAL | Status: DC | PRN

## 2016-01-13 MED ORDER — POTASSIUM CHLORIDE IN NACL 20-0.45 MEQ/L-% IV SOLN
INTRAVENOUS | Status: DC
  Administered 2016-01-14: 05:00:00 via INTRAVENOUS
  Filled 2016-01-13: qty 1000

## 2016-01-13 MED ORDER — DIPHENHYDRAMINE HCL 12.5 MG/5ML PO ELIX: 12.5000 mg | ORAL_SOLUTION | Freq: Four times a day (QID) | ORAL | Status: DC | PRN

## 2016-01-13 MED ORDER — FENTANYL CITRATE (PF) 100 MCG/2ML IJ SOLN
INTRAMUSCULAR | Status: DC | PRN
  Administered 2016-01-13: 100 ug via INTRAVENOUS
  Administered 2016-01-13: 50 ug via INTRAVENOUS

## 2016-01-13 MED ORDER — METRONIDAZOLE IN NACL 5-0.79 MG/ML-% IV SOLN
500.0000 mg | Freq: Once | INTRAVENOUS | Status: AC
  Administered 2016-01-13: 500 mg via INTRAVENOUS
  Filled 2016-01-13: qty 100

## 2016-01-13 MED ORDER — SODIUM CHLORIDE 0.9 % IJ SOLN
INTRAMUSCULAR | Status: AC
  Filled 2016-01-13: qty 10

## 2016-01-13 MED ORDER — ACETAMINOPHEN 325 MG PO TABS
650.0000 mg | ORAL_TABLET | Freq: Four times a day (QID) | ORAL | Status: DC | PRN
  Administered 2016-01-14: 650 mg via ORAL
  Filled 2016-01-13: qty 2

## 2016-01-13 MED ORDER — MUPIROCIN 2 % EX OINT
TOPICAL_OINTMENT | CUTANEOUS | Status: AC
Start: 2016-01-13 — End: 2016-01-13
  Filled 2016-01-13: qty 22

## 2016-01-13 MED ORDER — DEXTROSE 5 % IV SOLN
2.0000 g | INTRAVENOUS | Status: DC
  Administered 2016-01-13: 2 g via INTRAVENOUS
  Filled 2016-01-13: qty 2

## 2016-01-13 MED ORDER — HYDRALAZINE HCL 20 MG/ML IJ SOLN: 10.0000 mg | INTRAMUSCULAR | Status: DC | PRN

## 2016-01-13 SURGICAL SUPPLY — 30 items
APPLIER CLIP ROT 10 11.4 M/L (STAPLE)
BENZOIN TINCTURE PRP APPL 2/3 (GAUZE/BANDAGES/DRESSINGS) ×2 IMPLANT
CLIP APPLIE ROT 10 11.4 M/L (STAPLE) IMPLANT
COVER SURGICAL LIGHT HANDLE (MISCELLANEOUS) ×2 IMPLANT
CUTTER FLEX LINEAR 45M (STAPLE) IMPLANT
DECANTER SPIKE VIAL GLASS SM (MISCELLANEOUS) ×2 IMPLANT
DRAPE LAPAROSCOPIC ABDOMINAL (DRAPES) ×2 IMPLANT
ELECT REM PT RETURN 9FT ADLT (ELECTROSURGICAL) ×2
ELECTRODE REM PT RTRN 9FT ADLT (ELECTROSURGICAL) ×1 IMPLANT
ENDOLOOP SUT PDS II  0 18 (SUTURE)
ENDOLOOP SUT PDS II 0 18 (SUTURE) IMPLANT
GLOVE SURG ORTHO 8.0 STRL STRW (GLOVE) ×2 IMPLANT
GOWN STRL REUS W/TWL XL LVL3 (GOWN DISPOSABLE) ×4 IMPLANT
IRRIG SUCT STRYKERFLOW 2 WTIP (MISCELLANEOUS) ×2
IRRIGATION SUCT STRKRFLW 2 WTP (MISCELLANEOUS) ×1 IMPLANT
KIT BASIN OR (CUSTOM PROCEDURE TRAY) ×2 IMPLANT
POUCH SPECIMEN RETRIEVAL 10MM (ENDOMECHANICALS) ×2 IMPLANT
RELOAD 45 VASCULAR/THIN (ENDOMECHANICALS) IMPLANT
RELOAD STAPLE TA45 3.5 REG BLU (ENDOMECHANICALS) IMPLANT
SHEARS HARMONIC ACE PLUS 36CM (ENDOMECHANICALS) ×2 IMPLANT
STRIP CLOSURE SKIN 1/2X4 (GAUZE/BANDAGES/DRESSINGS) ×2 IMPLANT
SUT MNCRL AB 4-0 PS2 18 (SUTURE) ×2 IMPLANT
TOWEL OR 17X26 10 PK STRL BLUE (TOWEL DISPOSABLE) ×2 IMPLANT
TOWEL OR NON WOVEN STRL DISP B (DISPOSABLE) ×2 IMPLANT
TRAY FOLEY W/METER SILVER 14FR (SET/KITS/TRAYS/PACK) ×2 IMPLANT
TRAY FOLEY W/METER SILVER 16FR (SET/KITS/TRAYS/PACK) ×2 IMPLANT
TRAY LAPAROSCOPIC (CUSTOM PROCEDURE TRAY) ×2 IMPLANT
TROCAR BLADELESS OPT 5 75 (ENDOMECHANICALS) ×2 IMPLANT
TROCAR XCEL BLUNT TIP 100MML (ENDOMECHANICALS) ×2 IMPLANT
TROCAR XCEL NON-BLD 11X100MML (ENDOMECHANICALS) ×2 IMPLANT

## 2016-01-13 NOTE — Op Note (Signed)
OPERATIVE REPORT - LAPAROSCOPIC APPENDECTOMY  Preop diagnosis: Acute appendicitis  Postop diagnosis: Same  Procedure: Laparoscopic appendectomy  Surgeon:  Earnstine Regal, MD, FACS  Anesthesia: General endotracheal  Estimated blood loss: Minimal  Preparation: Chlora-prep  Complications: None  Indications:  44 year-old [name] was brought to the emergency room earlier this evening by her family for abdominal pain that started around 3 PM yesterday. The pain was constant and progressive. They could not find anything to relieve her pain. She denies any prior symptoms. She denies any fevers but reports some subjective chills. She denies any nausea or vomiting. She denies any prior abdominal surgery. CT scan positive for acute appendicitis.  Procedure:  Patient is brought to the operating room and placed in a supine position on the operating room table. Following administration of general anesthesia, a time out was held and the patient's name and procedure is confirmed. Patient is then prepped and draped in the usual strict aseptic fashion.  After ascertaining that an adequate level of anesthesia has been achieved, a peri-umbilical incision is made with a #15 blade. Dissection is carried down to the fascia. Fascia is incised in the midline and the peritoneal cavity is entered cautiously. A #0-vicryl pursestring suture is placed in the fascia. An Hassan cannula is introduced under direct vision and secured with the pursestring suture. The abdomen is insufflated with carbon dioxide. The laparoscope is introduced and the abdomen is explored. Operative ports are placed in the right upper quadrant and left lower quadrant. The appendix is identified. The mesoappendix is divided with the harmonic scalpel. Dissection is carried down to the base of the appendix. The base of the appendix is dissected out clearing the junction with the cecal wall. Using an Endo-GIA stapler, the base of the appendix  is transected at the junction with the cecal wall. There is good approximation of tissue along the staple line. There is good hemostasis along the staple line. The appendix is placed into an endo-catch bag and withdrawn through the umbilical port. The #0-vicryl pursestring suture is tied securely.  Right lower quadrant is irrigated with warm saline which is evacuated. Good hemostasis is noted. Ports are removed under direct vision. Good hemostasis is noted at the port sites. Pneumoperitoneum is released.  Skin incisions are anesthetized with local anesthetic. Wounds are closed with interrupted 4-0 Monocryl subcuticular sutures. Wounds are washed and dried and benzoin and Steri-Strips are applied. Dressings are applied. The patient is awakened from anesthesia and brought to the recovery room. The patient tolerated the procedure well.  Earnstine Regal, MD, [facility] Office: [phone]

## 2016-01-13 NOTE — Transfer of Care (Signed)
Immediate Anesthesia Transfer of Care Note  Patient: Shelley Escobar  Procedure(s) Performed: Procedure(s): APPENDECTOMY LAPAROSCOPIC (N/A)  Patient Location: PACU  Anesthesia Type:General  Level of Consciousness: awake and alert   Airway & Oxygen Therapy: Patient Spontanous Breathing and Patient connected to face mask oxygen  Post-op Assessment: Report given to RN and Post -op Vital signs reviewed and stable  Post vital signs: Reviewed and stable  Last Vitals:  Vitals:   01/13/16 1056 01/13/16 1302  BP: 115/74 132/90  Pulse: 94 (!) 114  Resp: 20   Temp: 37.2 C     Last Pain:  Vitals:   01/13/16 1056  TempSrc: Oral  PainSc:          Complications: No apparent anesthesia complications

## 2016-01-13 NOTE — Anesthesia Procedure Notes (Signed)
Procedure Name: Intubation Date/Time: 01/13/2016 12:03 PM Performed by: Daphna Lafuente, Virgel Gess Pre-anesthesia Checklist: Patient identified, Emergency Drugs available, Suction available and Patient being monitored Patient Re-evaluated:Patient Re-evaluated prior to inductionOxygen Delivery Method: Circle System Utilized Preoxygenation: Pre-oxygenation with 100% oxygen Intubation Type: IV induction Ventilation: Mask ventilation without difficulty Laryngoscope Size: Mac and 3 Grade View: Grade I Tube type: Oral Number of attempts: 1 Airway Equipment and Method: Stylet Placement Confirmation: ETT inserted through vocal cords under direct vision,  positive ETCO2 and breath sounds checked- equal and bilateral Secured at: 22 cm Tube secured with: Tape Dental Injury: Teeth and Oropharynx as per pre-operative assessment

## 2016-01-13 NOTE — Anesthesia Preprocedure Evaluation (Signed)
Anesthesia Evaluation  Patient identified by MRN, date of birth, ID band Patient awake    Reviewed: Allergy & Precautions, H&P , NPO status , Patient's Chart, lab work & pertinent test results  Airway Mallampati: III  TM Distance: >3 FB Neck ROM: Full    Dental no notable dental hx. (+) Teeth Intact, Dental Advisory Given   Pulmonary neg pulmonary ROS,    Pulmonary exam normal breath sounds clear to auscultation- rhonchi       Cardiovascular hypertension, Pt. on medications Normal cardiovascular exam Rhythm:Regular Rate:Normal     Neuro/Psych negative neurological ROS  negative psych ROS   GI/Hepatic negative GI ROS, Neg liver ROS,   Endo/Other  diabetes, Poorly Controlled, Type 2, Oral Hypoglycemic AgentsObesity Hyperlipidemia  Renal/GU negative Renal ROS  negative genitourinary   Musculoskeletal negative musculoskeletal ROS (+)   Abdominal (+) + obese,   Peds  Hematology  (+) anemia ,   Anesthesia Other Findings   Reproductive/Obstetrics menorrhagia                             Anesthesia Physical  Anesthesia Plan  ASA: III  Anesthesia Plan: General   Post-op Pain Management:    Induction: Intravenous  Airway Management Planned: LMA  Additional Equipment:   Intra-op Plan:   Post-operative Plan: Extubation in OR  Informed Consent: I have reviewed the patients History and Physical, chart, labs and discussed the procedure including the risks, benefits and alternatives for the proposed anesthesia with the patient or authorized representative who has indicated his/her understanding and acceptance.   Dental advisory given  Plan Discussed with: CRNA, Anesthesiologist and Surgeon  Anesthesia Plan Comments:         Anesthesia Quick Evaluation

## 2016-01-13 NOTE — H&P (Signed)
Shelley Escobar is an 45 y.o. female.   Chief Complaint: abd pain HPI: [name] was brought to the emergency room earlier this evening by her family for abdominal pain that started around 3 PM yesterday. The pain was constant and progressive. They could not find anything to relieve her pain. She denies any prior symptoms. She denies any fevers but reports some subjective chills. She denies any nausea or vomiting. She denies any prior abdominal surgery. She does tend to have more constipation issues than normal bowel movements. Her last bowel movement was Tuesday morning. There is no blood. She denies any dysuria. She did not have a good appetite yesterday. She denies any weight loss.  I offered translating services however the patient and family refused. The patient preferred her son to translate. Past Medical History:  Diagnosis Date  . Anemia    hx  . DM (diabetes mellitus) ([place])   . Hyperlipidemia   . SVD (spontaneous vaginal delivery)    x 3  . Vitamin D deficiency     Past Surgical History:  Procedure Laterality Date  . HYSTEROSCOPY W/D&C N/A 08/07/2013   Procedure: DILATATION AND CURETTAGE /HYSTEROSCOPY;  Surgeon: Azalia Bilis, MD;  Location: [facility];  Service: Gynecology;  Laterality: N/A;  . INTRAUTERINE DEVICE (IUD) INSERTION  09/13/2013  . TUBAL LIGATION     Age 37    Family History  Problem Relation Age of Onset  . Adopted: Yes  . Family history unknown: Yes   Social History:  reports that she has never smoked. She has never used smokeless tobacco. She reports that she does not drink alcohol or use drugs.  Allergies: No Known Allergies   (Not in a hospital admission)  Results for orders placed or performed during the hospital encounter of 01/12/16 (from the past 48 hour(s))  Lipase, blood     Status: None   Collection Time: 01/12/16  8:43 PM  Result Value Ref Range   Lipase 33 11 - 51 U/L  Comprehensive metabolic panel     Status: Abnormal    Collection Time: 01/12/16  8:43 PM  Result Value Ref Range   Sodium 136 135 - 145 mmol/L   Potassium 4.0 3.5 - 5.1 mmol/L   Chloride 102 101 - 111 mmol/L   CO2 28 22 - 32 mmol/L   Glucose, Bld 217 (H) 65 - 99 mg/dL   BUN 12 6 - 20 mg/dL   Creatinine, Ser 0.56 0.44 - 1.00 mg/dL   Calcium 9.7 8.9 - 10.3 mg/dL   Total Protein 8.2 (H) 6.5 - 8.1 g/dL   Albumin 4.3 3.5 - 5.0 g/dL   AST 59 (H) 15 - 41 U/L   ALT 55 (H) 14 - 54 U/L   Alkaline Phosphatase 59 38 - 126 U/L   Total Bilirubin 0.7 0.3 - 1.2 mg/dL   GFR calc non Af Amer >60 >60 mL/min   GFR calc Af Amer >60 >60 mL/min    Comment: (NOTE) The eGFR has been calculated using the CKD EPI equation. This calculation has not been validated in all clinical situations. eGFR's persistently <60 mL/min signify possible Chronic Kidney Disease.    Anion gap 6 5 - 15  CBC     Status: Abnormal   Collection Time: 01/12/16  8:43 PM  Result Value Ref Range   WBC 11.1 (H) 4.0 - 10.5 K/uL   RBC 4.65 3.87 - 5.11 MIL/uL   Hemoglobin 11.2 (L) 12.0 - 15.0 g/dL  HCT 34.9 (L) 36.0 - 46.0 %   MCV 75.1 (L) 78.0 - 100.0 fL   MCH 24.1 (L) 26.0 - 34.0 pg   MCHC 32.1 30.0 - 36.0 g/dL   RDW 13.3 11.5 - 15.5 %   Platelets 342 150 - 400 K/uL  Urinalysis, Routine w reflex microscopic     Status: None   Collection Time: 01/12/16 10:00 PM  Result Value Ref Range   Color, Urine YELLOW YELLOW   APPearance CLEAR CLEAR   Specific Gravity, Urine 1.021 1.005 - 1.030   pH 6.5 5.0 - 8.0   Glucose, UA NEGATIVE NEGATIVE mg/dL   Hgb urine dipstick NEGATIVE NEGATIVE   Bilirubin Urine NEGATIVE NEGATIVE   Ketones, ur NEGATIVE NEGATIVE mg/dL   Protein, ur NEGATIVE NEGATIVE mg/dL   Nitrite NEGATIVE NEGATIVE   Leukocytes, UA NEGATIVE NEGATIVE    Comment: MICROSCOPIC NOT DONE ON URINES WITH NEGATIVE PROTEIN, BLOOD, LEUKOCYTES, NITRITE, OR GLUCOSE <1000 mg/dL.  Pregnancy, urine     Status: None   Collection Time: 01/12/16 10:00 PM  Result Value Ref Range   Preg Test,  Ur NEGATIVE NEGATIVE    Comment:        THE SENSITIVITY OF THIS METHODOLOGY IS >20 mIU/mL.    Ct Abdomen Pelvis W Contrast  Result Date: 01/13/2016 CLINICAL DATA:  Left-sided abdominal pain. Evaluate for diverticulitis. EXAM: CT ABDOMEN AND PELVIS WITH CONTRAST TECHNIQUE: Multidetector CT imaging of the abdomen and pelvis was performed using the standard protocol following bolus administration of intravenous contrast. CONTRAST:  171m ISOVUE-300 IOPAMIDOL (ISOVUE-300) INJECTION 61% COMPARISON:  None. FINDINGS: Lower chest: Limited visualization of the lower thorax demonstrates minimal dependent subpleural ground-glass atelectasis. Punctate granuloma within the image right lower lobe (image 3, series 3), the sequela of prior granulomatous infection. No focal airspace opacities. No pleural effusion. Normal heart size.  No pericardial effusion. Hepatobiliary: Normal hepatic contour. No discrete hepatic lesions. Normal appearance of the gallbladder given degree distention. No radiopaque gallstones. No intra extrahepatic biliary ductal dilatation. No ascites. Pancreas: Normal appearance of the pancreas Spleen: Normal appearance of the spleen. Incidental note is made of a small splenule. Adrenals/Urinary Tract: There is symmetric enhancement and excretion of the bilateral kidneys. No definite renal stones on this postcontrast examination. Punctate (approximately 0.4 cm) hypo attenuating lesion within the inferior pole the right kidney (coronal image 82, series 4) is too small to accurately characterize though favored to represent a renal cyst. No discrete left-sided renal lesions. No urinary obstruction or perinephric stranding. Normal appearance of the bilateral adrenal glands. Normal appearance of the urinary bladder given degree distention. Stomach/Bowel: Ingested enteric contrast extends to the level of the distal small bowel. Scattered minimal colonic diverticulosis without evidence of diverticulitis. The  appendix is enlarged measuring approximately 1.2 cm in greatest oblique coronal diameter (coronal image 65, series 4) with associated minimal amount of adjacent periappendiceal stranding, findings compatible with acute uncomplicated appendicitis. No evidence of perforation or definable/ drainable fluid collection. The bowel is otherwise normal in course and caliber without wall thickening. No pneumoperitoneum, pneumatosis or portal venous gas. Vascular/Lymphatic: Normal caliber the abdominal aorta. The major branch vessels of the abdominal aorta appear patent on this non CTA examination. Reproductive: Note is made of a approximately 2.1 cm hypo attenuating (17 Hounsfield unit) left-sided adnexal cyst. No discrete right-sided adnexal lesion. No free fluid in the pelvic cul-de-sac. Other: Regional soft tissues appear normal. Musculoskeletal: No acute or aggressive osseous abnormalities. Stigmata of DISH within the thoracic spine. IMPRESSION: 1. Findings  compatible with acute uncomplicated appendicitis. No evidence of perforation for definable/drainable fluid collection. 2. Minimal colonic diverticulosis without evidence of diverticulitis. 3. Incidentally noted approximately 2.1 cm left-sided presumably physiologic adnexal cyst. Electronically Signed   By: Sandi Mariscal M.D.   On: 01/13/2016 00:04   Review of Systems  Constitutional: Positive for chills. Negative for weight loss.  HENT: Negative for nosebleeds.   Eyes: Negative for blurred vision.  Respiratory: Negative for shortness of breath.   Cardiovascular: Negative for chest pain, palpitations, orthopnea and PND.       Denies DOE  Gastrointestinal: Positive for abdominal pain and constipation. Negative for nausea and vomiting.  Genitourinary: Negative for dysuria and hematuria.  Musculoskeletal: Negative.   Skin: Negative for itching and rash.  Neurological: Negative for dizziness, focal weakness, seizures, loss of consciousness and headaches.        Denies TIAs, amaurosis fugax  Endo/Heme/Allergies: Does not bruise/bleed easily.  Psychiatric/Behavioral: The patient is not nervous/anxious.     Blood pressure 129/78, pulse 84, temperature 98.5 F (36.9 C), temperature source Oral, resp. rate 14, height _0  (1.676 m), weight 78.6 kg (173 lb 3.2 oz), SpO2 96 %. Physical Exam  Vitals reviewed. Constitutional: She is oriented to person, place, and time. She appears well-developed and well-nourished. No distress.  HENT:  Head: Normocephalic and atraumatic.  Right Ear: External ear normal.  Left Ear: External ear normal.  Eyes: Conjunctivae are normal. No scleral icterus.  Neck: Normal range of motion. Neck supple. No tracheal deviation present. No thyromegaly present.  Cardiovascular: Normal rate and normal heart sounds.   Respiratory: Effort normal and breath sounds normal. No stridor. No respiratory distress. She has no wheezes.  GI: Soft. She exhibits no distension. There is no rebound and no guarding.  Mild RLQ/periumbilical TTP. No rebound/guarding/peritoneal signs  Musculoskeletal: She exhibits no edema or tenderness.  Lymphadenopathy:    She has no cervical adenopathy.  Neurological: [name], place, and time. She exhibits normal muscle tone.  Skin: Skin is warm and dry. No rash noted. She is not diaphoretic. No erythema. No pallor.  Psychiatric: She has a normal mood and affect. Her behavior is normal. Judgment and thought content normal.     Assessment/Plan Acute appendicitis Obesity  insulin-dependent diabetes mellitus  She is afebrile, nontoxic, not tachycardic and her abdominal exam is stable. Therefore I do not believe she needs emergent appendectomy. She will be admitted to our surgical service and continued on antibiotics. Plans will be made for her to go the operating room later today with my partner Dr. Harlow Asa.  We discussed the etiology and management of acute appendicitis. We discussed  operative and nonoperative management.  I recommended operative management along with IV antibiotics.  We discussed laparoscopic appendectomy. We discussed the risk and benefits of surgery including but not limited to bleeding, infection, injury to surrounding structures, need to convert to an open procedure, blood clot formation, post operative abscess or wound infection, staple line complications such as leak or bleeding, hernia formation, post operative ileus, need for additional procedures, anesthesia complications, and the typical postoperative course. I explained that the patient should expect a good improvement in their symptoms.   Leighton Ruff. Redmond Pulling, MD, [place], Bariatric, [facility] Minimally Invasive Surgery [facility], [place]  [place] Curry, MD 01/13/2016, 1:51 AM

## 2016-01-13 NOTE — Anesthesia Postprocedure Evaluation (Signed)
Anesthesia Post Note  Patient: Shelley Escobar  Procedure(s) Performed: Procedure(s) (LRB): APPENDECTOMY LAPAROSCOPIC (N/A)  Patient location during evaluation: PACU Anesthesia Type: General Level of consciousness: sedated Pain management: pain level controlled Vital Signs Assessment: post-procedure vital signs reviewed and stable Respiratory status: spontaneous breathing and respiratory function stable Cardiovascular status: stable Anesthetic complications: no    Last Vitals:  Vitals:   01/13/16 1330 01/13/16 1345  BP: 137/80 131/78  Pulse: (!) 106 (!) 104  Resp: 18 18  Temp: 36.8 C     Last Pain:  Vitals:   01/13/16 1330  TempSrc:   PainSc: 1                  Dray Dente DANIEL

## 2016-01-13 NOTE — ED Provider Notes (Signed)
[facility] DEPT Provider Note   CSN: [id number] Arrival date & time: 01/12/16  [id number]  First Provider Contact:  First MD Initiated Contact with Patient 01/12/16 2147        History   Chief Complaint Chief Complaint  Patient presents with  . Abdominal Pain    SINCE 3PM TODAY    HPI Shelley Escobar is a 45 y.o. female.  45 year old female who primarily speaks [place] presents with her family members for evaluation for abdominal pain that started acutely today at 3 PM. The patient was offered a translator to assist in taking history and explaining results but refused. She wanted to have her son at bedside translate. The patient denies any history of abdominal surgeries. She states that she has never had pain like this before but developed pain in her abdomen mostly on the left side earlier this afternoon. She has not eaten or drank anything since this morning. Denies fevers or chills. She has had associated nausea. No constipation or diarrhea. Reports normal urinary patterns.      Past Medical History:  Diagnosis Date  . Anemia    hx  . DM (diabetes mellitus) ([place])   . Hyperlipidemia   . SVD (spontaneous vaginal delivery)    x 3  . Vitamin D deficiency     Patient Active Problem List   Diagnosis Date Noted  . BMI 29.0-29.9,adult 11/24/2015  . Obesity (BMI 30.0-34.9) 01/27/2012  . Diabetes mellitus type 2, uncontrolled, without complications ([place]) [id number]  . Dyslipidemia 07/02/2011    Past Surgical History:  Procedure Laterality Date  . HYSTEROSCOPY W/D&C N/A 08/07/2013   Procedure: DILATATION AND CURETTAGE /HYSTEROSCOPY;  Surgeon: Azalia Bilis, MD;  Location: [facility];  Service: Gynecology;  Laterality: N/A;  . INTRAUTERINE DEVICE (IUD) INSERTION  09/13/2013  . TUBAL LIGATION     Age 40    OB History    Gravida Para Term Preterm AB Living   3 3 3     3    SAB TAB Ectopic Multiple Live Births           3       Home Medications    Prior to Admission  medications   Medication Sig Start Date End Date Taking? Authorizing Provider  glipiZIDE (GLUCOTROL XL) 10 MG 24 hr tablet Take 1 tablet (10 mg total) by mouth daily with breakfast. 08/25/15  Yes Chelle Jeffery, PA-C  Insulin Glargine (LANTUS SOLOSTAR) 100 UNIT/ML Solostar Pen Inject 46 Units into the skin daily at 10 pm. Patient taking differently: Inject 50 Units into the skin daily at 10 pm.  08/25/15  Yes Chelle Jeffery, PA-C  Insulin Pen Needle (B-D UF III MINI PEN NEEDLES) 31G X 5 MM MISC USE DAILY AS DIRECTED WITH INSULIN 04/15/15  Yes Chelle Jeffery, PA-C  metFORMIN (GLUCOPHAGE) 1000 MG tablet Take 1 tablet (1,000 mg total) by mouth 2 (two) times daily with a meal. 11/24/15  Yes Chelle Jeffery, PA-C  gemfibrozil (LOPID) 600 MG tablet Take 1 tablet (600 mg total) by mouth 2 (two) times daily before a meal. Patient not taking: Reported on 01/12/2016 08/25/15   Chelle Jeffery, PA-C  lisinopril (ZESTRIL) 2.5 MG tablet Take 1 tablet (2.5 mg total) by mouth daily. Patient not taking: Reported on 01/12/2016 04/15/15   Harrison Mons, PA-C    Family History Family History  Problem Relation Age of Onset  . Adopted: Yes  . Family history unknown: Yes    Social History Social History  Substance Use  Topics  . Smoking status: Never Smoker  . Smokeless tobacco: Never Used  . Alcohol use No     Allergies   Review of patient's allergies indicates no known allergies.   Review of Systems Review of Systems  Constitutional: Negative for chills, fatigue and fever.  HENT: Negative for congestion, postnasal drip and rhinorrhea.   Eyes: Negative for visual disturbance.  Respiratory: Negative for cough, chest tightness, shortness of breath and wheezing.   Cardiovascular: Negative for chest pain and palpitations.  Gastrointestinal: Positive for abdominal pain and nausea. Negative for diarrhea and vomiting.  Genitourinary: Negative for dysuria, hematuria and urgency.  Musculoskeletal: Negative for back  pain and myalgias.  Skin: Negative for rash.  Neurological: Negative for dizziness, weakness and headaches.  Hematological: Does not bruise/bleed easily.     Physical Exam Updated Vital Signs BP 129/78 (BP Location: Left Arm)   Pulse 84   Temp 98.5 F (36.9 C) (Oral)   Resp 14   Ht 5\' 6"  (1.676 m)   Wt 173 lb 3.2 oz (78.6 kg)   SpO2 96%   BMI 27.96 kg/m   Physical Exam  Constitutional: She is oriented to person, place, and time. She appears well-developed and well-nourished. No distress.  HENT:  Head: Normocephalic and atraumatic.  Right Ear: External ear normal.  Left Ear: External ear normal.  Nose: Nose normal.  Mouth/Throat: Oropharynx is clear and moist. No oropharyngeal exudate.  Eyes: EOM are normal. Pupils are equal, round, and reactive to light.  Neck: Normal range of motion. Neck supple.  Cardiovascular: Normal rate, regular rhythm, normal heart sounds and intact distal pulses.   No murmur heard. Pulmonary/Chest: Effort normal. No respiratory distress. She has no wheezes. She has no rales.  Abdominal: Soft. She exhibits no distension. There is tenderness in the periumbilical area, left upper quadrant and left lower quadrant. There is no rigidity and no guarding.  Musculoskeletal: Normal range of motion. She exhibits no edema or tenderness.  Neurological: [name], place, and time.  Skin: Skin is warm and dry. No rash noted. She is not diaphoretic.  Vitals reviewed.    ED Treatments / Results  Labs (all labs ordered are listed, but only abnormal results are displayed) Labs Reviewed  COMPREHENSIVE METABOLIC PANEL - Abnormal; Notable for the following:       Result Value   Glucose, Bld 217 (*)    Total Protein 8.2 (*)    AST 59 (*)    ALT 55 (*)    All other components within normal limits  CBC - Abnormal; Notable for the following:    WBC 11.1 (*)    Hemoglobin 11.2 (*)    HCT 34.9 (*)    MCV 75.1 (*)    MCH 24.1 (*)    All  other components within normal limits  LIPASE, BLOOD  URINALYSIS, ROUTINE W REFLEX MICROSCOPIC (NOT AT [facility])  PREGNANCY, URINE    EKG  EKG Interpretation None       Radiology Ct Abdomen Pelvis W Contrast  Result Date: 01/13/2016 CLINICAL DATA:  Left-sided abdominal pain. Evaluate for diverticulitis. EXAM: CT ABDOMEN AND PELVIS WITH CONTRAST TECHNIQUE: Multidetector CT imaging of the abdomen and pelvis was performed using the standard protocol following bolus administration of intravenous contrast. CONTRAST:  112mL ISOVUE-300 IOPAMIDOL (ISOVUE-300) INJECTION 61% COMPARISON:  None. FINDINGS: Lower chest: Limited visualization of the lower thorax demonstrates minimal dependent subpleural ground-glass atelectasis. Punctate granuloma within the image right lower lobe (image  3, series 3), the sequela of prior granulomatous infection. No focal airspace opacities. No pleural effusion. Normal heart size.  No pericardial effusion. Hepatobiliary: Normal hepatic contour. No discrete hepatic lesions. Normal appearance of the gallbladder given degree distention. No radiopaque gallstones. No intra extrahepatic biliary ductal dilatation. No ascites. Pancreas: Normal appearance of the pancreas Spleen: Normal appearance of the spleen. Incidental note is made of a small splenule. Adrenals/Urinary Tract: There is symmetric enhancement and excretion of the bilateral kidneys. No definite renal stones on this postcontrast examination. Punctate (approximately 0.4 cm) hypo attenuating lesion within the inferior pole the right kidney (coronal image 82, series 4) is too small to accurately characterize though favored to represent a renal cyst. No discrete left-sided renal lesions. No urinary obstruction or perinephric stranding. Normal appearance of the bilateral adrenal glands. Normal appearance of the urinary bladder given degree distention. Stomach/Bowel: Ingested enteric contrast extends to the level of the distal small  bowel. Scattered minimal colonic diverticulosis without evidence of diverticulitis. The appendix is enlarged measuring approximately 1.2 cm in greatest oblique coronal diameter (coronal image 65, series 4) with associated minimal amount of adjacent periappendiceal stranding, findings compatible with acute uncomplicated appendicitis. No evidence of perforation or definable/ drainable fluid collection. The bowel is otherwise normal in course and caliber without wall thickening. No pneumoperitoneum, pneumatosis or portal venous gas. Vascular/Lymphatic: Normal caliber the abdominal aorta. The major branch vessels of the abdominal aorta appear patent on this non CTA examination. Reproductive: Note is made of a approximately 2.1 cm hypo attenuating (17 Hounsfield unit) left-sided adnexal cyst. No discrete right-sided adnexal lesion. No free fluid in the pelvic cul-de-sac. Other: Regional soft tissues appear normal. Musculoskeletal: No acute or aggressive osseous abnormalities. Stigmata of DISH within the thoracic spine. IMPRESSION: 1. Findings compatible with acute uncomplicated appendicitis. No evidence of perforation for definable/drainable fluid collection. 2. Minimal colonic diverticulosis without evidence of diverticulitis. 3. Incidentally noted approximately 2.1 cm left-sided presumably physiologic adnexal cyst. Electronically Signed   By: Sandi Mariscal M.D.   On: 01/13/2016 00:04   Procedures Procedures (including critical care time)  Medications Ordered in ED Medications  cefTRIAXone (ROCEPHIN) 2 g in dextrose 5 % 50 mL IVPB (2 g Intravenous New Bag/Given 01/13/16 0056)    And  metroNIDAZOLE (FLAGYL) IVPB 500 mg (500 mg Intravenous New Bag/Given 01/13/16 0055)  sodium chloride 0.9 % bolus 1,000 mL (1,000 mLs Intravenous New Bag/Given 01/12/16 2236)  HYDROmorphone (DILAUDID) injection 0.5 mg (0.5 mg Intravenous Given 01/12/16 2236)  iopamidol (ISOVUE-300) 61 % injection 100 mL (100 mLs Intravenous Contrast  Given 01/12/16 2345)  HYDROmorphone (DILAUDID) injection 1 mg (1 mg Intravenous Given 01/13/16 0052)     Initial Impression / Assessment and Plan / ED Course  I have reviewed the triage vital signs and the nursing notes.  Pertinent labs & imaging results that were available during my care of the patient were reviewed by me and considered in my medical decision making (see chart for details).  Clinical Course    Patient was seen and evaluated in stable condition. Hemodynamically stable. Laboratory studies with mild leukocytosis. Patient was given Dilaudid for pain control. CT consistent with uncomplicated acute appendicitis. Case was discussed with Dr. Redmond Pulling who recommended starting the patient on Rocephin and Flagyl which were ordered. He said he would come bedside to see and evaluate the patient. I discussed all results with the patient and her family at bedside. They expressed understanding. I told him final recommendations for treatment would come from the  surgical team.  Final Clinical Impressions(s) / ED Diagnoses   Final diagnoses:  Acute appendicitis, unspecified acute appendicitis type    New Prescriptions New Prescriptions   No medications on file     Harvel Quale, MD 01/13/16 [phone]

## 2016-01-13 NOTE — Interval H&P Note (Signed)
History and Physical Interval Note:  01/13/2016 11:18 AM  Shelley Escobar  has presented today for surgery, with the diagnosis of Appendicitis.  The various methods of treatment have been discussed with the patient and family. After consideration of risks, benefits and other options for treatment, the patient has consented to    Procedure(s): APPENDECTOMY LAPAROSCOPIC (N/A) as a surgical intervention .    The patient's history has been reviewed, patient examined, no change in status, stable for surgery.  I have reviewed the patient's chart and labs.  Questions were answered to the patient's satisfaction.    Earnstine Regal, MD, [facility] Office: [place]

## 2016-01-14 LAB — GLUCOSE, CAPILLARY: GLUCOSE-CAPILLARY: 163 mg/dL — AB (ref 65–99)

## 2016-01-14 MED ORDER — HYDROCODONE-ACETAMINOPHEN 5-325 MG PO TABS: 1.0000 | ORAL_TABLET | Freq: Four times a day (QID) | ORAL | 0 refills | Status: DC | PRN

## 2016-01-14 NOTE — Progress Notes (Signed)
Shelley Escobar to be D/C'd Home per MD order.  Discussed with the patient and all questions fully answered.  VSS, Skin clean, dry and intact without evidence of skin break down, no evidence of skin tears noted. IV catheter discontinued intact. Site without signs and symptoms of complications. Dressing and pressure applied.  An After Visit Summary was printed and given to the patient. Patient received prescription.  D/c education completed with patient/family including follow up instructions, medication list, d/c activities limitations if indicated, with other d/c instructions as indicated by MD - patient able to verbalize understanding, all questions fully answered.   Patient instructed to return to ED, call 911, or call MD for any changes in condition.   Patient escorted via [place], and D/C home via private auto.  Marcy Salvo 01/14/2016 5:59 AM

## 2016-01-14 NOTE — Discharge Instructions (Signed)
-  see above 

## 2016-01-14 NOTE — Discharge Summary (Signed)
  Patient ID: Shelley Escobar [id number] 45 y.o. 11/14/1970  Admit date: 01/12/2016  Discharge date and time: 01/14/2016  Admitting Physician: Greer Pickerel  Discharge Physician: Adin Hector  Admission Diagnoses: Acute appendicitis, unspecified acute appendicitis type [K35.80]  Discharge Diagnoses: Acute appendicitis  Operations: Procedure(s): APPENDECTOMY LAPAROSCOPIC  Admission Condition: fair  Discharged Condition: good  Indication for Admission: [name] was brought to the emergency room earlier this evening by her family for abdominal pain that started around 3 PM yesterday. The pain was constant and progressive. They could not find anything to relieve her pain. She denies any prior symptoms. She denies any fevers but reports some subjective chills. She denies any nausea or vomiting. She denies any prior abdominal surgery. CT scan positive for acute appendicitis.  Hospital Course: The patient was admitted at 1:51 AM by Dr. Greer Pickerel and was started on antibiotics and IV fluid resuscitation.  Dr. Lynford Humphrey assumed care in the morning and took her to the operating room where she underwent uncomplicated laparoscopic appendectomy observation overnight was without complication.  She became ambulatory.  Had no trouble voiding.  Advance to solid diet by supper and wanted to go home in the morning.  Examination on postop day 1 revealed she was alert and oriented.  Her son was present.  He asked if he could take her home at 7 AM and I told him that was fine.  Her abdominal exam revealed that her abdomen was soft, nondistended, minimally tender.  The wounds were clean and dry.  She was given instructions in diet and activities.  She was given a prescription for Norco for pain.  She was asked to call and set up an appointment to see [place] in the [facility] on surgery office in 3 weeks.  Consults: None  Significant Diagnostic Studies: Lab, CT scan, surgical  pathology  Treatments: surgery: Laparoscopic appendectomy  Disposition: Home  Patient Instructions:    Medication List    TAKE these medications   gemfibrozil 600 MG tablet Commonly known as:  LOPID Take 1 tablet (600 mg total) by mouth 2 (two) times daily before a meal.   glipiZIDE 10 MG 24 hr tablet Commonly known as:  GLUCOTROL XL Take 1 tablet (10 mg total) by mouth daily with breakfast.   HYDROcodone-acetaminophen 5-325 MG tablet Commonly known as:  NORCO Take 1-2 tablets by mouth every 6 (six) hours as needed.   Insulin Glargine 100 UNIT/ML Solostar Pen Commonly known as:  LANTUS SOLOSTAR Inject 46 Units into the skin daily at 10 pm. What changed:  how much to take   Insulin Pen Needle 31G X 5 MM Misc Commonly known as:  B-D UF III MINI PEN NEEDLES USE DAILY AS DIRECTED WITH INSULIN   lisinopril 2.5 MG tablet Commonly known as:  ZESTRIL Take 1 tablet (2.5 mg total) by mouth daily.   metFORMIN 1000 MG tablet Commonly known as:  GLUCOPHAGE Take 1 tablet (1,000 mg total) by mouth 2 (two) times daily with a meal.       Activity: activity as tolerated Diet: low fat, low cholesterol diet Wound Care: as directed  Follow-up:  With [facility] surgery in 3 weeks.  Signed: Edsel Petrin. Dalbert Batman, M.D., FACS General and minimally invasive surgery Breast and Colorectal Surgery  01/14/2016, 5:32 AM

## 2016-02-03 ENCOUNTER — Other Ambulatory Visit: Payer: Self-pay | Admitting: Emergency Medicine

## 2016-02-03 DIAGNOSIS — Z1231 Encounter for screening mammogram for malignant neoplasm of breast: Secondary | ICD-10-CM

## 2016-02-15 ENCOUNTER — Other Ambulatory Visit: Payer: Self-pay | Admitting: Physician Assistant

## 2016-02-15 DIAGNOSIS — E1165 Type 2 diabetes mellitus with hyperglycemia: Principal | ICD-10-CM

## 2016-02-15 DIAGNOSIS — Z794 Long term (current) use of insulin: Principal | ICD-10-CM

## 2016-02-15 DIAGNOSIS — IMO0001 Reserved for inherently not codable concepts without codable children: Secondary | ICD-10-CM

## 2016-02-18 ENCOUNTER — Ambulatory Visit
Admission: RE | Admit: 2016-02-18 | Discharge: 2016-02-18 | Disposition: A | Discharge status: Home / Self Care | Payer: BLUE CROSS/BLUE SHIELD | Source: Ambulatory Visit | Attending: Emergency Medicine | Admitting: Emergency Medicine

## 2016-02-18 DIAGNOSIS — Z1231 Encounter for screening mammogram for malignant neoplasm of breast: Secondary | ICD-10-CM

## 2016-02-23 ENCOUNTER — Telehealth: Payer: Self-pay | Admitting: Emergency Medicine

## 2016-02-23 NOTE — Telephone Encounter (Signed)
Call patient and tell her she needs to be scheduled for a mammogram as long as she is not pregnant. Please schedule that if she is agreeable.

## 2016-02-24 NOTE — Telephone Encounter (Signed)
Unable to reach patient no VM

## 2016-02-26 NOTE — Telephone Encounter (Signed)
Called pt, no answer.

## 2016-02-29 NOTE — Telephone Encounter (Signed)
Sent unable to reach letter with Dr Perfecto Kingdom message that pt should have a mammogram and asked for CB if she needs help scheduling it.

## 2016-03-08 ENCOUNTER — Ambulatory Visit (INDEPENDENT_AMBULATORY_CARE_PROVIDER_SITE_OTHER): Payer: BLUE CROSS/BLUE SHIELD | Admitting: Physician Assistant

## 2016-03-08 ENCOUNTER — Encounter: Payer: Self-pay | Admitting: Physician Assistant

## 2016-03-08 VITALS — BP 100/62 | HR 94 | Temp 98.3°F | Resp 16 | Ht 64.0 in | Wt 173.0 lb

## 2016-03-08 DIAGNOSIS — E1165 Type 2 diabetes mellitus with hyperglycemia: Secondary | ICD-10-CM | POA: Diagnosis not present

## 2016-03-08 DIAGNOSIS — Z794 Long term (current) use of insulin: Secondary | ICD-10-CM | POA: Diagnosis not present

## 2016-03-08 DIAGNOSIS — E785 Hyperlipidemia, unspecified: Secondary | ICD-10-CM | POA: Diagnosis not present

## 2016-03-08 DIAGNOSIS — IMO0001 Reserved for inherently not codable concepts without codable children: Secondary | ICD-10-CM

## 2016-03-08 DIAGNOSIS — E669 Obesity, unspecified: Secondary | ICD-10-CM

## 2016-03-08 LAB — COMPREHENSIVE METABOLIC PANEL
ALK PHOS: 52 U/L (ref 33–115)
ALT: 38 U/L — AB (ref 6–29)
AST: 43 U/L — ABNORMAL HIGH (ref 10–35)
Albumin: 4.3 g/dL (ref 3.6–5.1)
BUN: 13 mg/dL (ref 7–25)
CALCIUM: 9.4 mg/dL (ref 8.6–10.2)
CHLORIDE: 103 mmol/L (ref 98–110)
CO2: 27 mmol/L (ref 20–31)
CREATININE: 0.57 mg/dL (ref 0.50–1.10)
GLUCOSE: 167 mg/dL — AB (ref 65–99)
POTASSIUM: 4.1 mmol/L (ref 3.5–5.3)
Sodium: 137 mmol/L (ref 135–146)
Total Bilirubin: 0.4 mg/dL (ref 0.2–1.2)
Total Protein: 7.6 g/dL (ref 6.1–8.1)

## 2016-03-08 LAB — CBC WITH DIFFERENTIAL/PLATELET
BASOS ABS: 0 {cells}/uL (ref 0–200)
BASOS PCT: 0 %
EOS ABS: 415 {cells}/uL (ref 15–500)
Eosinophils Relative: 5 %
HEMATOCRIT: 33.9 % — AB (ref 35.0–45.0)
Hemoglobin: 11 g/dL — ABNORMAL LOW (ref 11.7–15.5)
LYMPHS PCT: 22 %
Lymphs Abs: 1826 cells/uL (ref 850–3900)
MCH: 24.3 pg — AB (ref 27.0–33.0)
MCHC: 32.4 g/dL (ref 32.0–36.0)
MCV: 74.8 fL — AB (ref 80.0–100.0)
MONO ABS: 581 {cells}/uL (ref 200–950)
MONOS PCT: 7 %
MPV: 10.9 fL (ref 7.5–12.5)
NEUTROS PCT: 66 %
Neutro Abs: 5478 cells/uL (ref 1500–7800)
PLATELETS: 316 10*3/uL (ref 140–400)
RBC: 4.53 MIL/uL (ref 3.80–5.10)
RDW: 13.6 % (ref 11.0–15.0)
WBC: 8.3 10*3/uL (ref 3.8–10.8)

## 2016-03-08 LAB — MICROALBUMIN, URINE: MICROALB UR: 1.1 mg/dL

## 2016-03-08 LAB — LIPID PANEL
CHOL/HDL RATIO: 3 ratio (ref <=5.0)
CHOLESTEROL: 75 mg/dL — AB (ref 125–200)
HDL: 25 mg/dL — ABNORMAL LOW (ref >=46)
LDL Cholesterol: 31 mg/dL (ref <130)
Triglycerides: 95 mg/dL (ref <150)
VLDL: 19 mg/dL (ref <30)

## 2016-03-08 LAB — HEMOGLOBIN A1C
Hgb A1c MFr Bld: 8.7 % — ABNORMAL HIGH (ref <5.7)
MEAN PLASMA GLUCOSE: 203 mg/dL

## 2016-03-08 MED ORDER — METFORMIN HCL 1000 MG PO TABS: ORAL_TABLET | ORAL | 3 refills | Status: DC

## 2016-03-08 MED ORDER — LISINOPRIL 2.5 MG PO TABS: 2.5000 mg | ORAL_TABLET | Freq: Every day | ORAL | 3 refills | Status: DC

## 2016-03-08 NOTE — Progress Notes (Signed)
Patient ID: Shelley Escobar, female    DOB: 11/25/1970, 45 y.o.   MRN: [id number]  PCP: Harrison Mons, PA-C  Subjective:   Chief Complaint  Patient presents with  . Follow-up    DIABETES    HPI Presents for evaluation of diabetes. Her son is present to help translate.  Feels well. Tolerating her medications without adverse effects. Has stopped lisinopril due to BP being low 100's/60's. She did not realize that we were using it for renoprotection, rather than blood pressure. When she was taking it, she did not have dizziness, fatigue or any other adverse effects.   Review of Systems Denies chest pain, shortness of breath, Hamor, dizziness, vision change, nausea, vomiting, diarrhea, constipation, melena, hematochezia, dysuria, increased urinary urgency or frequency, increased hunger or thirst, unintentional weight change, unexplained myalgias or arthralgias, rash.     Patient Active Problem List   Diagnosis Date Noted  . Appendicitis 01/13/2016  . Acute appendicitis 01/13/2016  . BMI 29.0-29.9,adult 11/24/2015  . Obesity (BMI 30.0-34.9) 01/27/2012  . Diabetes mellitus type 2, uncontrolled, without complications ([place]) [id number]  . Dyslipidemia 07/02/2011     Prior to Admission medications   Medication Sig Start Date End Date Taking? Authorizing Provider  gemfibrozil (LOPID) 600 MG tablet Take 1 tablet (600 mg total) by mouth 2 (two) times daily before a meal. 08/25/15  Yes Liisa Picone, PA-C  glipiZIDE (GLUCOTROL XL) 10 MG 24 hr tablet Take 1 tablet (10 mg total) by mouth daily with breakfast. 08/25/15  Yes Betzy Barbier, PA-C  HYDROcodone-acetaminophen (NORCO) 5-325 MG tablet Take 1-2 tablets by mouth every 6 (six) hours as needed. 01/14/16  Yes Fanny Skates, MD  Insulin Glargine (LANTUS SOLOSTAR) 100 UNIT/ML Solostar Pen Inject 46 Units into the skin daily at 10 pm. Patient taking differently: Inject 50 Units into the skin daily at 10 pm.  08/25/15  Yes Brookie Wayment, PA-C    lisinopril (ZESTRIL) 2.5 MG tablet Take 1 tablet (2.5 mg total) by mouth daily. 04/15/15  Yes Kamya Watling, PA-C  metFORMIN (GLUCOPHAGE) 1000 MG tablet TAKE 1 TABLET(1000 MG) BY MOUTH TWICE DAILY WITH A MEAL 02/16/16  Yes Catheline Hixon, PA-C  Insulin Pen Needle (B-D UF III MINI PEN NEEDLES) 31G X 5 MM MISC USE DAILY AS DIRECTED WITH INSULIN 04/15/15   Darlene Brozowski, PA-C     No Known Allergies     Objective:  Physical Exam  Constitutional: She is oriented to person, place, and time. She appears well-developed and well-nourished. She is active and cooperative. No distress.  BP 100/62 (BP Location: Left Arm, Patient Position: Sitting, Cuff Size: Normal)   Pulse 94   Temp 98.3 F (36.8 C) (Oral)   Resp 16   Ht 5\' 4"  (1.626 m)   Wt 173 lb (78.5 kg)   SpO2 98%   BMI 29.70 kg/m   HENT:  Head: Normocephalic and atraumatic.  Right Ear: Hearing normal.  Left Ear: Hearing normal.  Eyes: Conjunctivae are normal. No scleral icterus.  Neck: Normal range of motion. Neck supple. No thyromegaly present.  Cardiovascular: Normal rate, regular rhythm and normal heart sounds.   Pulses:      Radial pulses are 2+ on the right side, and 2+ on the left side.  Pulmonary/Chest: Effort normal and breath sounds normal.  Lymphadenopathy:       Head (right side): No tonsillar, no preauricular, no posterior auricular and no occipital adenopathy present.       Head (left side): No tonsillar, no  preauricular, no posterior auricular and no occipital adenopathy present.    She has no cervical adenopathy.       Right: No supraclavicular adenopathy present.       Left: No supraclavicular adenopathy present.  Neurological: [name], place, and time. No sensory deficit.  Skin: Skin is warm, dry and intact. No rash noted. No cyanosis or erythema. Nails show no clubbing.  Psychiatric: She has a normal mood and affect. Her speech is normal and behavior is normal.            Assessment & Plan:   1. Uncontrolled type 2 diabetes mellitus without complication, with long-term current use of insulin (HCC) Resume lisinopril. Encouraged eye and dental exams. Await labs. Adjust regimen as indicated by results. - CBC with Differential/Platelet - Comprehensive metabolic panel - Hemoglobin A1c - Microalbumin, urine - HM Diabetes Eye Exam - HM Diabetes Foot Exam - metFORMIN (GLUCOPHAGE) 1000 MG tablet; TAKE 1 TABLET(1000 MG) BY MOUTH TWICE DAILY WITH A MEAL  Dispense: 180 tablet; Refill: 3 - lisinopril (ZESTRIL) 2.5 MG tablet; Take 1 tablet (2.5 mg total) by mouth daily.  Dispense: 90 tablet; Refill: 3  2. Dyslipidemia Await labs. Adjust regimen as indicated by results. - Lipid panel  3. Obesity (BMI 30.0-34.9) Healthy eating, regular exercise.   Return in about 3 months (around 06/07/2016) for re-evaluation and annual wellness visit (with pap test).   Fara Chute, PA-C Physician Assistant-Certified [facility]

## 2016-03-08 NOTE — Patient Instructions (Addendum)
     IF you received an x-ray today, you will receive an invoice from [facility]. Please contact [facility] at [phone] with questions or concerns regarding your invoice.   IF you received labwork today, you will receive an invoice from [employer]. Please contact [facility] at [phone] with questions or concerns regarding your invoice.   Our billing staff will not be able to assist you with questions regarding bills from these companies.  You will be contacted with the lab results as soon as they are available. The fastest way to get your results is to activate your My Chart account. Instructions are located on the last page of this paperwork. If you have not heard from [place] regarding the results in 2 weeks, please contact this office.    [facility]  [address] [place], [place] [address]  Phone: [phone]  [facility] [place], [place], [place] [address]  Phone: [phone]  [place] [facility] [address], [place], [place] [address]  Phone: [phone]   Please schedule with a dentist of your choice. Good dental care can help control the diabetes!

## 2016-03-08 NOTE — Progress Notes (Signed)
Subjective:    Patient ID: Shelley Escobar, female    DOB: Sep 16, 1970, 45 y.o.   MRN: [id number] Chief Complaint  Patient presents with  . Follow-up    DIABETES    HPI Patient is a long standing diabetic from [place] who presents today for a diabetes follow-up. She is here with her son who will help translate.  Over the past 3 months she reports no complaints other than her recent surgery for an appendicitis on 01/12/16. Most recent A1C was on 08/25/15 was 8.3. Reports that she is tolerating medications well. She recently stopped taking her Lisinopril as her blood pressure number were around 100's/60's. She reports no dizziness or light headedness w this medication.     No Known Allergies Past Medical History:  Diagnosis Date  . Anemia    hx  . DM (diabetes mellitus) ([place])   . Hyperlipidemia   . SVD (spontaneous vaginal delivery)    x 3  . Vitamin D deficiency    Family History  Problem Relation Age of Onset  . Adopted: Yes  . Family history unknown: Yes   Social History   Social History  . Marital status: Married    Spouse name: [name]  . Number of children: 3  . Years of education: none   Occupational History  . sales Self Employed    self-employed (Health visitor)   Social History Main Topics  . Smoking status: Never Smoker  . Smokeless tobacco: Never Used  . Alcohol use No  . Drug use: No  . Sexual activity: Yes    Partners: Female, Female    Birth control/ protection: Surgical     Comment: Tubal   Other Topics Concern  . Not on file   Social History Narrative   Was a "war baby," adopted, never attended school and doesn't know anything about her family history.      Married in 1983 and has 3 grown children, 2 boys and one girl.       Lives with husband and two boys.    Prior to Admission medications   Medication Sig Start Date End Date Taking? Authorizing Provider  gemfibrozil (LOPID) 600 MG tablet Take 1 tablet (600 mg total) by mouth 2 (two)  times daily before a meal. 08/25/15  Yes Chelle Jeffery, PA-C  glipiZIDE (GLUCOTROL XL) 10 MG 24 hr tablet Take 1 tablet (10 mg total) by mouth daily with breakfast. 08/25/15  Yes Chelle Jeffery, PA-C  HYDROcodone-acetaminophen (NORCO) 5-325 MG tablet Take 1-2 tablets by mouth every 6 (six) hours as needed. 01/14/16  Yes Fanny Skates, MD  Insulin Glargine (LANTUS SOLOSTAR) 100 UNIT/ML Solostar Pen Inject 46 Units into the skin daily at 10 pm. Patient taking differently: Inject 50 Units into the skin daily at 10 pm.  08/25/15  Yes Chelle Jeffery, PA-C  lisinopril (ZESTRIL) 2.5 MG tablet Take 1 tablet (2.5 mg total) by mouth daily. 04/15/15  Yes Chelle Jeffery, PA-C  metFORMIN (GLUCOPHAGE) 1000 MG tablet TAKE 1 TABLET(1000 MG) BY MOUTH TWICE DAILY WITH A MEAL 02/16/16  Yes Chelle Jeffery, PA-C  Insulin Pen Needle (B-D UF III MINI PEN NEEDLES) 31G X 5 MM MISC USE DAILY AS DIRECTED WITH INSULIN 04/15/15   Harrison Mons, PA-C    Review of Systems  Constitutional: Negative for activity change, appetite change, chills and fever.  HENT: Negative for ear pain, hearing loss, sore throat and tinnitus.   Eyes: Negative for visual disturbance.  Respiratory: Negative for chest  tightness.   Cardiovascular: Negative for chest pain, palpitations and leg swelling.  Gastrointestinal: Negative for abdominal pain.  Genitourinary: Negative for dysuria, frequency (No more than usual ) and urgency.  Neurological: Negative for dizziness, light-headedness and numbness.  Psychiatric/Behavioral: Negative for agitation and dysphoric mood.       Objective:   Physical Exam  Constitutional: She appears well-developed and well-nourished. No distress.  HENT:  Head: Normocephalic and atraumatic.  Right Ear: External ear normal.  Left Ear: External ear normal.  Mouth/Throat: Oropharyngeal exudate present.  Eyes: Conjunctivae are normal. Right eye exhibits no discharge. Left eye exhibits no discharge.  Neck: Neck supple.    Cardiovascular: Normal rate, regular rhythm, normal heart sounds and intact distal pulses.  Exam reveals no gallop and no friction rub.   No murmur heard. Raidial and dorsalis pedis pulse 2+  Pulmonary/Chest: Effort normal. No respiratory distress.  Abdominal: Soft. There is no tenderness.  Musculoskeletal: She exhibits no edema.  Swelling around left clavicle, benign (Recent ultrasound showed no abnormalities)  Lymphadenopathy:    She has cervical adenopathy.  Neurological: She is alert. No cranial nerve deficit.  Reflex Scores:      Bicep reflexes are 2+ on the right side and 2+ on the left side.      Brachioradialis reflexes are 2+ on the right side and 2+ on the left side.      Patellar reflexes are 2+ on the right side and 2+ on the left side.      Achilles reflexes are 2+ on the right side and 2+ on the left side. Sensation in lower legs intact  Skin: Skin is warm and dry.  Psychiatric: She has a normal mood and affect.       Assessment & Plan:  1. Uncontrolled type 2 diabetes mellitus without complication, with long-term current use of insulin ([place]) Patient has no complaints and is tolerating medication well. Will refill her Metformin and Lisinopril. Educated patient on kidney protective properties of Lisinopril and to continue medication if if blood pressure is within normal range. Will adjust therapy if needed according to labs  - CBC with Differential/Platelet - Comprehensive metabolic panel - Hemoglobin A1c - Microalbumin, urine - HM Diabetes Eye Exam - HM Diabetes Foot Exam - metFORMIN (GLUCOPHAGE) 1000 MG tablet; TAKE 1 TABLET(1000 MG) BY MOUTH TWICE DAILY WITH A MEAL  Dispense: 180 tablet; Refill: 3 - lisinopril (ZESTRIL) 2.5 MG tablet; Take 1 tablet (2.5 mg total) by mouth daily.  Dispense: 90 tablet; Refill: 3  2. Dyslipidemia Stable. Will continue current regimen and recheck lipids  - Lipid panel  3. Obesity (BMI 30.0-34.9) Anticipatory guidance given  regarding exercise and nutrition

## 2016-03-10 ENCOUNTER — Encounter: Payer: Self-pay | Admitting: Physician Assistant

## 2016-03-10 DIAGNOSIS — D649 Anemia, unspecified: Secondary | ICD-10-CM | POA: Insufficient documentation

## 2016-03-10 DIAGNOSIS — K760 Fatty (change of) liver, not elsewhere classified: Secondary | ICD-10-CM

## 2016-04-17 ENCOUNTER — Other Ambulatory Visit: Payer: Self-pay | Admitting: Physician Assistant

## 2016-04-17 DIAGNOSIS — E1165 Type 2 diabetes mellitus with hyperglycemia: Principal | ICD-10-CM

## 2016-04-17 DIAGNOSIS — Z794 Long term (current) use of insulin: Principal | ICD-10-CM

## 2016-04-17 DIAGNOSIS — IMO0001 Reserved for inherently not codable concepts without codable children: Secondary | ICD-10-CM

## 2016-04-18 ENCOUNTER — Other Ambulatory Visit: Payer: Self-pay | Admitting: Physician Assistant

## 2016-04-18 DIAGNOSIS — Z794 Long term (current) use of insulin: Principal | ICD-10-CM

## 2016-04-18 DIAGNOSIS — IMO0001 Reserved for inherently not codable concepts without codable children: Secondary | ICD-10-CM

## 2016-04-18 DIAGNOSIS — E1165 Type 2 diabetes mellitus with hyperglycemia: Principal | ICD-10-CM

## 2016-06-07 ENCOUNTER — Ambulatory Visit (INDEPENDENT_AMBULATORY_CARE_PROVIDER_SITE_OTHER): Payer: BLUE CROSS/BLUE SHIELD | Admitting: Physician Assistant

## 2016-06-07 ENCOUNTER — Encounter: Payer: Self-pay | Admitting: Physician Assistant

## 2016-06-07 VITALS — BP 110/70 | HR 84 | Temp 99.0°F | Resp 16 | Ht 64.0 in | Wt 174.8 lb

## 2016-06-07 DIAGNOSIS — IMO0001 Reserved for inherently not codable concepts without codable children: Secondary | ICD-10-CM

## 2016-06-07 DIAGNOSIS — K760 Fatty (change of) liver, not elsewhere classified: Secondary | ICD-10-CM

## 2016-06-07 DIAGNOSIS — D649 Anemia, unspecified: Secondary | ICD-10-CM

## 2016-06-07 DIAGNOSIS — E785 Hyperlipidemia, unspecified: Secondary | ICD-10-CM | POA: Diagnosis not present

## 2016-06-07 DIAGNOSIS — Z124 Encounter for screening for malignant neoplasm of cervix: Secondary | ICD-10-CM | POA: Diagnosis not present

## 2016-06-07 DIAGNOSIS — E669 Obesity, unspecified: Secondary | ICD-10-CM | POA: Diagnosis not present

## 2016-06-07 DIAGNOSIS — Z0000 Encounter for general adult medical examination without abnormal findings: Secondary | ICD-10-CM | POA: Diagnosis not present

## 2016-06-07 DIAGNOSIS — Z794 Long term (current) use of insulin: Secondary | ICD-10-CM

## 2016-06-07 DIAGNOSIS — E1165 Type 2 diabetes mellitus with hyperglycemia: Secondary | ICD-10-CM

## 2016-06-07 DIAGNOSIS — E66811 Obesity, class 1: Secondary | ICD-10-CM

## 2016-06-07 LAB — POCT URINALYSIS DIP (MANUAL ENTRY)
BILIRUBIN UA: NEGATIVE
BILIRUBIN UA: NEGATIVE
GLUCOSE UA: NEGATIVE
Leukocytes, UA: NEGATIVE
Nitrite, UA: NEGATIVE
PH UA: 5
Protein Ur, POC: NEGATIVE
RBC UA: NEGATIVE
SPEC GRAV UA: 1.015
Urobilinogen, UA: 0.2

## 2016-06-07 NOTE — Patient Instructions (Addendum)
There are refills of all your medications on file at the pharmacy.  Keeping You Healthy  Get These Tests 1. Blood Pressure- Have your blood pressure checked once a year by your health care provider.  Normal blood pressure is 120/80. 2. Weight- Have your body mass index (BMI) calculated to screen for obesity.  BMI is measure of body fat based on height and weight.  You can also calculate your own BMI at GravelBags.it. 3. Cholesterol- Have your cholesterol checked every 5 years starting at age 38 then yearly starting at age 22. 80. Chlamydia, HIV, and other sexually transmitted diseases- Get screened every year until age 28, then within three months of each new sexual provider. 5. Pap Test - Every 1-5 years; discuss with your health care provider. 6. Mammogram- Every 1-2 years starting at age 15--50  Take these medicines  Calcium with Vitamin D-Your body needs 1200 mg of Calcium each day and [phone] IU of Vitamin D daily.  Your body can only absorb 500 mg of Calcium at a time so Calcium must be taken in 2 or 3 divided doses throughout the day.  Multivitamin with folic acid- Once daily if it is possible for you to become pregnant.  Get these Immunizations  Gardasil-Series of three doses; prevents HPV related illness such as genital warts and cervical cancer.  Menactra-Single dose; prevents meningitis.  Tetanus shot- Every 10 years.  Flu shot-Every year.  Take these steps 1. Do not smoke-Your healthcare provider can help you quit.  For tips on how to quit go to www.smokefree.gov or call 1-800 QUITNOW. 2. Be physically active- Exercise 5 days a week for at least 30 minutes.  If you are not already physically active, start slow and gradually work up to 30 minutes of moderate physical activity.  Examples of moderate activity include walking briskly, dancing, swimming, bicycling, etc. 3. Breast Cancer- A self breast exam every month is important for early detection of breast  cancer.  For more information and instruction on self breast exams, ask your healthcare provider or https://www.patel.info/. 4. Eat a healthy diet- Eat a variety of healthy foods such as fruits, vegetables, whole grains, low fat milk, low fat cheeses, yogurt, lean meats, poultry and fish, beans, nuts, tofu, etc.  For more information go to www. Thenutritionsource.org 5. Drink alcohol in moderation- Limit alcohol intake to one drink or less per day. Never drink and drive. 6. Depression- Your emotional health is as important as your physical health.  If you're feeling down or losing interest in things you normally enjoy please talk to your healthcare provider about being screened for depression. 7. Dental visit- Brush and floss your teeth twice daily; visit your dentist twice a year. 8. Eye doctor- Get an eye exam at least every 2 years. 9. Helmet use- Always wear a helmet when riding a bicycle, motorcycle, rollerblading or skateboarding. 6. Safe sex- If you may be exposed to sexually transmitted infections, use a condom. 11. Seat belts- Seat belts can save your live; always wear one. 12. Smoke/Carbon Monoxide detectors- These detectors need to be installed on the appropriate level of your home. Replace batteries at least once a year. 13. Skin cancer- When out in the sun please cover up and use sunscreen 15 SPF or higher. 14. Violence- If anyone is threatening or hurting you, please tell your healthcare provider.           IF you received an x-ray today, you will receive an invoice from [facility]. Please  contact [facility] at [phone] with questions or concerns regarding your invoice.   IF you received labwork today, you will receive an invoice from [facility]. Please contact [facility] at [phone] with questions or concerns regarding your invoice.   Our billing staff will not be able to assist you with questions regarding bills from these  companies.  You will be contacted with the lab results as soon as they are available. The fastest way to get your results is to activate your My Chart account. Instructions are located on the last page of this paperwork. If you have not heard from [place] regarding the results in 2 weeks, please contact this office.

## 2016-06-07 NOTE — Progress Notes (Signed)
Patient ID: Shelley Escobar, female    DOB: 18-Feb-1971, 45 y.o.   MRN: [id number]  PCP: Harrison Mons, PA-C  Chief Complaint  Patient presents with  . Annual Exam  . Medication Refill    SEE RF for meds    Subjective:   Presents for [employer] Visit.  Cervical Cancer Screening: 2014, normal cytology Breast Cancer Screening: mammogram 01/2016, repeat in 1-2 years Colorectal Cancer Screening: not yet  a candidate Bone Density Testing: not yet HIV Screening: complete STI Screening: very low risk Seasonal Influenza Vaccination: current Td/Tdap Vaccination: 2011 Pneumococcal Vaccination: next dose at age 74 Zoster Vaccination: not yet Frequency of Dental evaluation: not regularly Frequency of Eye evaluation: not regularly  Experiences some hot flashes at night during the end of her menstrual bleed. Menstrual cycle remains regular.  Medications reviewed: all have refills on file at the pharmacy.     Patient Active Problem List   Diagnosis Date Noted  . Fatty infiltration of liver 03/10/2016  . Anemia 03/10/2016  . BMI 29.0-29.9,adult 11/24/2015  . Obesity (BMI 30.0-34.9) 01/27/2012  . Diabetes mellitus type 2, uncontrolled, without complications ([place]) [id number]  . Dyslipidemia 07/02/2011    Past Medical History:  Diagnosis Date  . Acute appendicitis 01/13/2016  . Anemia    hx  . DM (diabetes mellitus) ([place])   . Hyperlipidemia   . SVD (spontaneous vaginal delivery)    x 3  . Vitamin D deficiency      Prior to Admission medications   Medication Sig Start Date End Date Taking? Authorizing Provider  gemfibrozil (LOPID) 600 MG tablet Take 1 tablet (600 mg total) by mouth 2 (two) times daily before a meal. 08/25/15   Katrisha Segall, PA-C  glipiZIDE (GLUCOTROL XL) 10 MG 24 hr tablet Take 1 tablet (10 mg total) by mouth daily with breakfast. 08/25/15   Leeandra Ellerson, PA-C  HYDROcodone-acetaminophen (NORCO) 5-325 MG tablet Take 1-2 tablets by mouth every 6 (six) hours as  needed. 01/14/16   Fanny Skates, MD  Insulin Pen Needle (B-D UF III MINI PEN NEEDLES) 31G X 5 MM MISC USE DAILY AS DIRECTED WITH INSULIN 04/15/15   Thayden Lemire, PA-C  LANTUS SOLOSTAR 100 UNIT/ML Solostar Pen ADMINISTER 46 UNITS UNDER THE SKIN DAILY AT 10 PM 04/20/16   Zakariya Knickerbocker, PA-C  lisinopril (ZESTRIL) 2.5 MG tablet Take 1 tablet (2.5 mg total) by mouth daily. 03/08/16   Jerrell Mangel, PA-C  metFORMIN (GLUCOPHAGE) 1000 MG tablet TAKE 1 TABLET(1000 MG) BY MOUTH TWICE DAILY WITH A MEAL 03/08/16   Harrison Mons, PA-C    No Known Allergies  Past Surgical History:  Procedure Laterality Date  . HYSTEROSCOPY W/D&C N/A 08/07/2013   Procedure: DILATATION AND CURETTAGE /HYSTEROSCOPY;  Surgeon: Azalia Bilis, MD;  Location: [facility];  Service: Gynecology;  Laterality: N/A;  . INTRAUTERINE DEVICE (IUD) INSERTION  09/13/2013  . LAPAROSCOPIC APPENDECTOMY N/A 01/13/2016   Procedure: APPENDECTOMY LAPAROSCOPIC;  Surgeon: Armandina Gemma, MD;  Location: [facility];  Service: General;  Laterality: N/A;  . TUBAL LIGATION     Age 32    Family History  Problem Relation Age of Onset  . Adopted: Yes  . Family history unknown: Yes    Social History   Social History  . Marital status: Married    Spouse name: [name]  . Number of children: 3  . Years of education: none   Occupational History  . sales Self Employed    self-employed (Health visitor)  Social History Main Topics  . Smoking status: Never Smoker  . Smokeless tobacco: Never Used  . Alcohol use No  . Drug use: No  . Sexual activity: Yes    Partners: Female, Female    Birth control/ protection: Surgical     Comment: Tubal   Other Topics Concern  . None   Social History Narrative   Was a "war baby," adopted, never attended school and doesn't know anything about her family history.      Married in 1983 and has 3 grown children, 2 boys and one girl.       Lives with husband and two boys.        Review of Systems    Constitutional: Negative.   HENT: Negative.   Eyes: Negative.   Respiratory: Negative.   Cardiovascular: Negative.   Gastrointestinal: Negative.   Genitourinary: Negative.   Musculoskeletal: Negative.   Skin: Negative.   Neurological: Negative.   Psychiatric/Behavioral: Negative.         Objective:  Physical Exam  Constitutional: She is oriented to person, place, and time. Vital signs are normal. She appears well-developed and well-nourished. She is active and cooperative. No distress.  BP 110/70 (BP Location: Left Arm, Patient Position: Sitting, Cuff Size: Normal)   Pulse 84   Temp 99 F (37.2 C) (Oral)   Resp 16   Ht 5\' 4"  (1.626 m)   Wt 174 lb 12.8 oz (79.3 kg)   SpO2 98%   BMI 30.00 kg/m    HENT:  Head: Normocephalic and atraumatic.  Right Ear: Hearing, tympanic membrane, external ear and ear canal normal. No foreign bodies.  Left Ear: Hearing, tympanic membrane, external ear and ear canal normal. No foreign bodies.  Nose: Nose normal.  Mouth/Throat: Uvula is midline, oropharynx is clear and moist and mucous membranes are normal. No oral lesions. Normal dentition. No dental abscesses or uvula swelling. No oropharyngeal exudate.  Eyes: Conjunctivae, EOM and lids are normal. Pupils are equal, round, and reactive to light. Right eye exhibits no discharge. Left eye exhibits no discharge. No scleral icterus.  Fundoscopic exam:      The right eye shows no arteriolar narrowing, no AV nicking, no exudate, no hemorrhage and no papilledema.       The left eye shows no arteriolar narrowing, no AV nicking, no exudate, no hemorrhage and no papilledema.  Neck: Trachea normal, normal range of motion and full passive range of motion without pain. Neck supple. No spinous process tenderness and no muscular tenderness present. No thyroid mass and no thyromegaly present.  Cardiovascular: Normal rate, regular rhythm, normal heart sounds, intact distal pulses and normal pulses.    Pulmonary/Chest: Effort normal and breath sounds normal. She exhibits no tenderness and no retraction. Right breast exhibits no inverted nipple, no mass, no nipple discharge, no skin change and no tenderness. Left breast exhibits no inverted nipple, no mass, no nipple discharge, no skin change and no tenderness. Breasts are symmetrical.  Abdominal: Soft. Normal appearance and bowel sounds are normal. She exhibits no distension and no mass. There is no hepatosplenomegaly. There is no tenderness. There is no rigidity, no rebound, no guarding, no CVA tenderness, no tenderness at McBurney's point and negative Murphy's sign. No hernia. Hernia confirmed negative in the right inguinal area and confirmed negative in the left inguinal area.  Genitourinary: Rectum normal, vagina normal and uterus normal. Rectal exam shows no external hemorrhoid and no fissure. No breast swelling, tenderness, discharge or bleeding. Pelvic exam  was performed with patient supine. No labial fusion. There is no rash, tenderness, lesion or injury on the right labia. There is no rash, tenderness, lesion or injury on the left labia. Cervix exhibits no motion tenderness, no discharge and no friability. Right adnexum displays no mass, no tenderness and no fullness. Left adnexum displays no mass, no tenderness and no fullness. No erythema, tenderness or bleeding in the vagina. No foreign body in the vagina. No signs of injury around the vagina. No vaginal discharge found.  Musculoskeletal: She exhibits no edema or tenderness.       Cervical back: Normal.       Thoracic back: Normal.       Lumbar back: Normal.  Lymphadenopathy:       Head (right side): No tonsillar, no preauricular, no posterior auricular and no occipital adenopathy present.       Head (left side): No tonsillar, no preauricular, no posterior auricular and no occipital adenopathy present.    She has no cervical adenopathy.    She has no axillary adenopathy.       Right: No  inguinal and no supraclavicular adenopathy present.       Left: No inguinal and no supraclavicular adenopathy present.  Neurological: [name], place, and time. She has normal strength and normal reflexes. No cranial nerve deficit. She exhibits normal muscle tone. Coordination and gait normal.  Skin: Skin is warm, dry and intact. No rash noted. She is not diaphoretic. No cyanosis or erythema. Nails show no clubbing.     Psychiatric: She has a normal mood and affect. Her speech is normal and behavior is normal. Judgment and thought content normal.           Assessment & Plan:  1. Annual physical exam Age appropriate anticipatory guidance provided.  2. Screening for cervical cancer If cytology and HPV both negative, repeat co-testing in 5 years - Pap IG and HPV (high risk) DNA detection  3. Uncontrolled type 2 diabetes mellitus without complication, with long-term current use of insulin ([place]) Await labs. Adjust regimen as indicated by results. - Hemoglobin A1c - Comprehensive metabolic panel - POCT urinalysis dipstick - Urine Microscopic  4. Dyslipidemia Await labs. Adjust regimen as indicated by results. - Lipid panel  5. Anemia, unspecified type Await labs. Adjust regimen as indicated by results. - CBC with Differential/Platelet  6. Fatty infiltration of liver Manage lipids.  7. Obesity (BMI 30.0-34.9) Healthy eating and regular exercise - TSH   Fara Chute, PA-C Physician Assistant-Certified [facility]

## 2016-06-08 LAB — COMPREHENSIVE METABOLIC PANEL
ALBUMIN: 4.5 g/dL (ref 3.5–5.5)
ALK PHOS: 70 IU/L (ref 39–117)
ALT: 47 IU/L — ABNORMAL HIGH (ref 0–32)
AST: 57 IU/L — AB (ref 0–40)
Albumin/Globulin Ratio: 1.3 (ref 1.2–2.2)
BUN/Creatinine Ratio: 22 (ref 9–23)
BUN: 14 mg/dL (ref 6–24)
Bilirubin Total: 0.3 mg/dL (ref 0.0–1.2)
CO2: 23 mmol/L (ref 18–29)
CREATININE: 0.63 mg/dL (ref 0.57–1.00)
Calcium: 10.1 mg/dL (ref 8.7–10.2)
Chloride: 99 mmol/L (ref 96–106)
GFR calc Af Amer: 124 mL/min/{1.73_m2} (ref >59)
GFR calc non Af Amer: 107 mL/min/{1.73_m2} (ref >59)
GLUCOSE: 201 mg/dL — AB (ref 65–99)
Globulin, Total: 3.6 g/dL (ref 1.5–4.5)
Potassium: 4.6 mmol/L (ref 3.5–5.2)
Sodium: 138 mmol/L (ref 134–144)
Total Protein: 8.1 g/dL (ref 6.0–8.5)

## 2016-06-08 LAB — HEMOGLOBIN A1C
Est. average glucose Bld gHb Est-mCnc: 237 mg/dL
HEMOGLOBIN A1C: 9.9 % — AB (ref 4.8–5.6)

## 2016-06-08 LAB — CBC WITH DIFFERENTIAL/PLATELET
Basophils Absolute: 0 10*3/uL (ref 0.0–0.2)
Basos: 1 %
EOS (ABSOLUTE): 0.5 10*3/uL — ABNORMAL HIGH (ref 0.0–0.4)
EOS: 6 %
HEMATOCRIT: 33.5 % — AB (ref 34.0–46.6)
HEMOGLOBIN: 10.7 g/dL — AB (ref 11.1–15.9)
IMMATURE GRANULOCYTES: 0 %
Immature Grans (Abs): 0 10*3/uL (ref 0.0–0.1)
LYMPHS: 27 %
Lymphocytes Absolute: 2.2 10*3/uL (ref 0.7–3.1)
MCH: 23.4 pg — ABNORMAL LOW (ref 26.6–33.0)
MCHC: 31.9 g/dL (ref 31.5–35.7)
MCV: 73 fL — ABNORMAL LOW (ref 79–97)
MONOCYTES: 7 %
Monocytes Absolute: 0.6 10*3/uL (ref 0.1–0.9)
NEUTROS PCT: 59 %
Neutrophils Absolute: 4.8 10*3/uL (ref 1.4–7.0)
Platelets: 431 10*3/uL — ABNORMAL HIGH (ref 150–379)
RBC: 4.58 x10E6/uL (ref 3.77–5.28)
RDW: 13.4 % (ref 12.3–15.4)
WBC: 8.2 10*3/uL (ref 3.4–10.8)

## 2016-06-08 LAB — URINALYSIS, MICROSCOPIC ONLY: Casts: NONE SEEN /lpf

## 2016-06-08 LAB — LIPID PANEL
CHOL/HDL RATIO: 3.1 ratio (ref 0.0–4.4)
Cholesterol, Total: 86 mg/dL — ABNORMAL LOW (ref 100–199)
HDL: 28 mg/dL — AB (ref >39)
LDL CALC: 37 mg/dL (ref 0–99)
TRIGLYCERIDES: 103 mg/dL (ref 0–149)
VLDL Cholesterol Cal: 21 mg/dL (ref 5–40)

## 2016-06-08 LAB — TSH: TSH: 1.17 u[IU]/mL (ref 0.450–4.500)

## 2016-06-15 LAB — PAP IG AND HPV HIGH-RISK: PAP Smear Comment: 0

## 2016-06-15 LAB — HPV, LOW VOLUME (REFLEX): HPV low volume reflex: NEGATIVE

## 2016-06-21 ENCOUNTER — Telehealth: Payer: Self-pay | Admitting: Emergency Medicine

## 2016-06-21 NOTE — Telephone Encounter (Signed)
-----   Message from Harrison Mons, [place] sent at 06/10/2016  9:29 AM EST ----- Please call this patient. You'll either need a translator, or speak to her son. 1. The diabetes is even less well controlled than last time. 2. Verify that she is taking: Metformin 1000 mg TWICE each day, Glipizide ER 10 mg EVERY day, and Lantus insulin 46 units EVERY DAY. 3. If she is doing the above, I plan to refer her to endocrinology. 4. If she is not, please advise her to.

## 2016-06-24 NOTE — Addendum Note (Signed)
Addended by: Fara Chute on: 06/24/2016 09:19 AM   Modules accepted: Orders

## 2016-06-28 ENCOUNTER — Other Ambulatory Visit: Payer: Self-pay | Admitting: Physician Assistant

## 2016-06-28 DIAGNOSIS — Z794 Long term (current) use of insulin: Principal | ICD-10-CM

## 2016-06-28 DIAGNOSIS — IMO0001 Reserved for inherently not codable concepts without codable children: Secondary | ICD-10-CM

## 2016-06-28 DIAGNOSIS — E1165 Type 2 diabetes mellitus with hyperglycemia: Principal | ICD-10-CM

## 2016-06-30 ENCOUNTER — Telehealth: Payer: Self-pay

## 2016-06-30 DIAGNOSIS — Z794 Long term (current) use of insulin: Principal | ICD-10-CM

## 2016-06-30 DIAGNOSIS — IMO0001 Reserved for inherently not codable concepts without codable children: Secondary | ICD-10-CM

## 2016-06-30 DIAGNOSIS — E1165 Type 2 diabetes mellitus with hyperglycemia: Principal | ICD-10-CM

## 2016-06-30 NOTE — Telephone Encounter (Signed)
Insulin pens Patient is calling to request a refill of her insulin pens.  She states that she has sent a request through the pharmacy but she is completely out.  Please advise  [phone]

## 2016-07-01 ENCOUNTER — Telehealth: Payer: Self-pay

## 2016-07-01 ENCOUNTER — Other Ambulatory Visit: Payer: Self-pay | Admitting: Emergency Medicine

## 2016-07-01 DIAGNOSIS — E1165 Type 2 diabetes mellitus with hyperglycemia: Principal | ICD-10-CM

## 2016-07-01 DIAGNOSIS — Z794 Long term (current) use of insulin: Principal | ICD-10-CM

## 2016-07-01 DIAGNOSIS — IMO0001 Reserved for inherently not codable concepts without codable children: Secondary | ICD-10-CM

## 2016-07-01 MED ORDER — INSULIN PEN NEEDLE 31G X 5 MM MISC: 3 refills | Status: DC

## 2016-07-01 NOTE — Telephone Encounter (Signed)
Following up on Diabetic pins - three days waiting.

## 2016-07-01 NOTE — Telephone Encounter (Signed)
Diabetic pens reordered and sent to pharmacy Please advise patient

## 2016-07-02 MED ORDER — INSULIN GLARGINE 100 UNIT/ML SOLOSTAR PEN: 46.0000 [IU] | PEN_INJECTOR | Freq: Every day | SUBCUTANEOUS | 1 refills | Status: DC

## 2016-07-02 NOTE — Telephone Encounter (Signed)
12/17 last ov and lab

## 2016-07-19 ENCOUNTER — Other Ambulatory Visit: Payer: Self-pay | Admitting: Physician Assistant

## 2016-07-19 DIAGNOSIS — IMO0001 Reserved for inherently not codable concepts without codable children: Secondary | ICD-10-CM

## 2016-07-19 DIAGNOSIS — E1165 Type 2 diabetes mellitus with hyperglycemia: Principal | ICD-10-CM

## 2016-07-19 DIAGNOSIS — Z794 Long term (current) use of insulin: Principal | ICD-10-CM

## 2016-07-19 NOTE — Telephone Encounter (Signed)
Left message for patient. I called and let patient know her Lantus was ready. I maintained the dose of 46 units nightly as determined by PA-Jeffery from last office visit 05/2016. I recommended she call our clinic if she has not yet seen an endocrinologist so that we can place that referral. I will forward this to PA-Jeffery for her review.

## 2016-08-02 NOTE — Progress Notes (Signed)
Patient ID: Shelley Escobar, female   DOB: Apr 07, 1971, 46 y.o.   MRN: [id number]            Reason for Appointment: Consultation for Type 2 Diabetes  Referring physician: Daphane Shepherd   History of Present Illness:          Date of diagnosis of type 2 diabetes mellitus:2000        Background history:   At the time of diagnosis she was having symptoms of dizziness and was initially given metformin; later she was also given Janumet. Her level of control has been consistently inadequate with A1c over 7% since at least 2013 Blood sugars however has been poorly controlled in 2013 with A1c consistently over 8%  Recent history:   She has been referred here because of persistently poor control and A1c of 9.9 done in 12/17  INSULIN regimen is: 50 U  Lantus hs    Non-insulin hypoglycemic drugs the patient is taking are: Metformin 1 g twice a day, glipizide ER 10 mg daily  Current management, blood sugar patterns and problems identified:  She does not check her blood sugars and has not done that for quite some time because of perceived cost of her test strips  Her last blood sugar was 201 in December, possibly fasting  She is mostly eating 2 meals a day and generally getting rice at lunch and noodles and her soup at dinner  She is generally active during the day working and also tries to walk up to 5 days a week in the evenings for at least 15 minutes.  She has not been able to lose weight  She does not complain of excessive thirst and is mostly drinking water           Side effects from medications have been: None  Compliance with the medical regimen: Fair Hypoglycemia:   never  Glucose monitoring:  done 0  times a day         Glucometer: none   Self-care: The diet that the patient has been following is: tries to limit fried food and drinks with sugar .     Meal times are:  Breakfast is at Lunch: Dinner:   Typical meal intake: Breakfast is none              Dietician visit,  most recent: Never               Exercise: Walking 15-30 minutes in the evenings at least 4-5 days a week   Weight history:  Wt Readings from Last 3 Encounters:  08/03/16 173 lb (78.5 kg)  06/07/16 174 lb 12.8 oz (79.3 kg)  03/08/16 173 lb (78.5 kg)    Glycemic control:   Lab Results  Component Value Date   HGBA1C 9.9 (H) 06/07/2016   HGBA1C 8.7 (H) 03/08/2016   HGBA1C 8.3 (H) 08/25/2015   Lab Results  Component Value Date   MICROALBUR 1.1 03/08/2016   LDLCALC 37 06/07/2016   CREATININE 0.63 06/07/2016   No results found for: MICRALBCREAT     Allergies as of 08/03/2016   No Known Allergies     Medication List       Accurate as of 08/03/16 12:45 PM. Always use your most recent med list.          gemfibrozil 600 MG tablet Commonly known as:  LOPID Take 1 tablet (600 mg total) by mouth 2 (two) times daily before a meal.   glipiZIDE 10  MG 24 hr tablet Commonly known as:  GLUCOTROL XL Take 1 tablet (10 mg total) by mouth daily with breakfast.   HYDROcodone-acetaminophen 5-325 MG tablet Commonly known as:  NORCO Take 1-2 tablets by mouth every 6 (six) hours as needed.   Insulin Glargine 100 UNIT/ML Solostar Pen Commonly known as:  LANTUS SOLOSTAR Inject 46 Units into the skin daily.   Insulin Pen Needle 31G X 5 MM Misc Commonly known as:  B-D UF III MINI PEN NEEDLES USE DAILY AS DIRECTED WITH INSULIN   lisinopril 2.5 MG tablet Commonly known as:  ZESTRIL Take 1 tablet (2.5 mg total) by mouth daily.   metFORMIN 1000 MG tablet Commonly known as:  GLUCOPHAGE TAKE 1 TABLET(1000 MG) BY MOUTH TWICE DAILY WITH A MEAL       Allergies: No Known Allergies  Past Medical History:  Diagnosis Date  . Acute appendicitis 01/13/2016  . Anemia    hx  . DM (diabetes mellitus) ([place])   . Hyperlipidemia   . SVD (spontaneous vaginal delivery)    x 3  . Vitamin D deficiency     Past Surgical History:  Procedure Laterality Date  . HYSTEROSCOPY W/D&C N/A  08/07/2013   Procedure: DILATATION AND CURETTAGE /HYSTEROSCOPY;  Surgeon: Azalia Bilis, MD;  Location: [facility];  Service: Gynecology;  Laterality: N/A;  . INTRAUTERINE DEVICE (IUD) INSERTION  09/13/2013  . LAPAROSCOPIC APPENDECTOMY N/A 01/13/2016   Procedure: APPENDECTOMY LAPAROSCOPIC;  Surgeon: Armandina Gemma, MD;  Location: [facility];  Service: General;  Laterality: N/A;  . TUBAL LIGATION     Age 8    Family History  Problem Relation Age of Onset  . Adopted: Yes  . Family history unknown: Yes    Social History:  reports that she has never smoked. She has never used smokeless tobacco. She reports that she does not drink alcohol or use drugs.   Review of Systems  Constitutional: Negative for weight loss and reduced appetite.  HENT: Negative for trouble swallowing.   Eyes: Negative for visual disturbance.  Respiratory: Negative for shortness of breath.   Cardiovascular: Negative for palpitations and leg swelling.  Gastrointestinal: Negative for nausea and abdominal pain.  Endocrine: Negative for fatigue and polydipsia.  Genitourinary: Positive for frequency.       Has nocturia only one time  Musculoskeletal: Negative for joint pain.  Skin: Negative for rash.  Neurological: Negative for weakness, numbness and tingling.  Psychiatric/Behavioral: Negative for insomnia.     Lipid history: Is on a long-term treatment regimen of gemfibrozil only Normal LDL   Lab Results  Component Value Date   CHOL 86 (L) 06/07/2016   HDL 28 (L) 06/07/2016   LDLCALC 37 06/07/2016   TRIG 103 06/07/2016   CHOLHDL 3.1 06/07/2016           Hypertension:None  Most recent eye exam was never  Most recent foot exam: 1/18   LABS:  No visits with results within 1 Week(s) from this visit.  Latest known visit with results is:  Office Visit on 06/07/2016  Component Date Value Ref Range Status  . Hgb A1c MFr Bld 06/07/2016 9.9* 4.8 - 5.6 % Final   Comment:          Pre-diabetes: 5.7 - 6.4           Diabetes: >6.4          Glycemic control for adults with diabetes: <7.0   . Est. average glucose Bld gHb Est-m* 06/07/2016 237  mg/dL  Final  . Glucose 06/07/2016 201* 65 - 99 mg/dL Final  . BUN 06/07/2016 14  6 - 24 mg/dL Final  . Creatinine, Ser 06/07/2016 0.63  0.57 - 1.00 mg/dL Final  . GFR calc non Af Amer 06/07/2016 107  >59 mL/min/1.73 Final  . GFR calc Af Amer 06/07/2016 124  >59 mL/min/1.73 Final  . BUN/Creatinine Ratio 06/07/2016 22  9 - 23 Final  . Sodium 06/07/2016 138  134 - 144 mmol/L Final  . Potassium 06/07/2016 4.6  3.5 - 5.2 mmol/L Final  . Chloride 06/07/2016 99  96 - 106 mmol/L Final  . CO2 06/07/2016 23  18 - 29 mmol/L Final  . Calcium 06/07/2016 10.1  8.7 - 10.2 mg/dL Final  . Total Protein 06/07/2016 8.1  6.0 - 8.5 g/dL Final  . Albumin 06/07/2016 4.5  3.5 - 5.5 g/dL Final  . Globulin, Total 06/07/2016 3.6  1.5 - 4.5 g/dL Final  . Albumin/Globulin Ratio 06/07/2016 1.3  1.2 - 2.2 Final  . Bilirubin Total 06/07/2016 0.3  0.0 - 1.2 mg/dL Final  . Alkaline Phosphatase 06/07/2016 70  39 - 117 IU/L Final  . AST 06/07/2016 57* 0 - 40 IU/L Final  . ALT 06/07/2016 47* 0 - 32 IU/L Final  . WBC 06/07/2016 8.2  3.4 - 10.8 x10E3/uL Final  . RBC 06/07/2016 4.58  3.77 - 5.28 x10E6/uL Final  . Hemoglobin 06/07/2016 10.7* 11.1 - 15.9 g/dL Final  . Hematocrit 06/07/2016 33.5* 34.0 - 46.6 % Final  . MCV 06/07/2016 73* 79 - 97 fL Final  . MCH 06/07/2016 23.4* 26.6 - 33.0 pg Final  . MCHC 06/07/2016 31.9  31.5 - 35.7 g/dL Final  . RDW 06/07/2016 13.4  12.3 - 15.4 % Final  . Platelets 06/07/2016 431* 150 - 379 x10E3/uL Final  . Neutrophils 06/07/2016 59  Not Estab. % Final  . Lymphs 06/07/2016 27  Not Estab. % Final  . Monocytes 06/07/2016 7  Not Estab. % Final  . Eos 06/07/2016 6  Not Estab. % Final  . Basos 06/07/2016 1  Not Estab. % Final  . Neutrophils Absolute 06/07/2016 4.8  1.4 - 7.0 x10E3/uL Final  . Lymphocytes Absolute 06/07/2016 2.2  0.7 - 3.1 x10E3/uL Final  .  Monocytes Absolute 06/07/2016 0.6  0.1 - 0.9 x10E3/uL Final  . EOS (ABSOLUTE) 06/07/2016 0.5* 0.0 - 0.4 x10E3/uL Final  . Basophils Absolute 06/07/2016 0.0  0.0 - 0.2 x10E3/uL Final  . Immature Granulocytes 06/07/2016 0  Not Estab. % Final  . Immature Grans (Abs) 06/07/2016 0.0  0.0 - 0.1 x10E3/uL Final  . Cholesterol, Total 06/07/2016 86* 100 - 199 mg/dL Final  . Triglycerides 06/07/2016 103  0 - 149 mg/dL Final  . HDL 06/07/2016 28* >39 mg/dL Final  . VLDL Cholesterol Cal 06/07/2016 21  5 - 40 mg/dL Final  . LDL Calculated 06/07/2016 37  0 - 99 mg/dL Final  . Chol/HDL Ratio 06/07/2016 3.1  0.0 - 4.4 ratio units Final   Comment:                                   T. Chol/HDL Ratio                                             Men  Women  1/2 Avg.Risk  3.4    3.3                                   Avg.Risk  5.0    4.4                                2X Avg.Risk  9.6    7.1                                3X Avg.Risk 23.4   11.0   . TSH 06/07/2016 1.170  0.450 - 4.500 uIU/mL Final  . Color, UA 06/07/2016 yellow  yellow Final  . Clarity, UA 06/07/2016 clear  clear Final  . Glucose, UA 06/07/2016 negative  negative Final  . Bilirubin, UA 06/07/2016 negative  negative Final  . Ketones, POC UA 06/07/2016 negative  negative Final  . Spec Grav, UA 06/07/2016 1.015   Final  . Blood, UA 06/07/2016 negative  negative Final  . pH, UA 06/07/2016 5.0   Final  . Protein Ur, POC 06/07/2016 negative  negative Final  . Urobilinogen, UA 06/07/2016 0.2   Final  . Nitrite, UA 06/07/2016 Negative  Negative Final  . Leukocytes, UA 06/07/2016 Negative  Negative Final  . DIAGNOSIS: 06/07/2016 Comment   Final  . Specimen adequacy: 06/07/2016 Comment   Final   Comment: Satisfactory for evaluation. Endocervical and/or squamous metaplastic cells (endocervical component) are present.   Marland Kitchen CLINICIAN PROVIDED ICD10: 06/07/2016 Comment   Final  . Performed by: 06/07/2016 Comment   Final    . PAP SMEAR COMMENT 06/07/2016 .   Final  . Note: 06/07/2016 Comment   Final   Comment: The Pap smear is a screening test designed to aid in the detection of premalignant and malignant conditions of the uterine cervix.  It is not a diagnostic procedure and should not be used as the sole means of detecting cervical cancer.  Both false-positive and false-negative reports do occur.   . Test Methodology 06/07/2016 Comment   Final   Comment: This liquid based ThinPrep(R) pap test was screened with the use of an image guided system.   . HPV, high-risk 06/07/2016 CANCELED   Final-Edited   Comment: The quantity of specimen remaining in the vial after Pap slide preparation was less than the 4 mL minimum cell suspension required. Low sample cellularity may be the cause.  See HPV, low volume rfx test result. This high-risk HPV test detects thirteen high-risk types (16/18/31/33/35/39/45/51/52/56/58/59/68) without differentiation.  Result canceled by the ancillary   . WBC, UA 06/07/2016 0-5  0 - 5 /hpf Final  . RBC, UA 06/07/2016 0-2  0 - 2 /hpf Final  . Epithelial Cells (non renal) 06/07/2016 0-10  0 - 10 /hpf Final  . Casts 06/07/2016 None seen  None seen /lpf Final  . Mucus, UA 06/07/2016 Present  Not Estab. Final  . Bacteria, UA 06/07/2016 Few  None seen/Few Final  . HPV low volume reflex 06/07/2016 Negative  Negative Final   Comment: This test detects fourteen high-risk HPV types (16,18,31,33,35,39,45, 51,52,56,58,59,66,68) without differentiation.     Physical Examination:  BP 108/70   Pulse 93   Ht 5\' 4"  (1.626 m)   Wt 173 lb (78.5 kg)   SpO2 93%   BMI 29.70 kg/m  GENERAL:         Patient has Mild generalized obesity.   HEENT:         Eye exam shows normal external appearance. Fundus exam shows no retinopathy.  Oral exam shows normal mucosa .  NECK:   There is no lymphadenopathy  Thyroid is not enlarged and no nodules felt.  Carotids are normal to palpation and no bruit  heard LUNGS:         Chest is symmetrical. Lungs are clear to auscultation.Marland Kitchen   HEART:         Heart sounds:  S1 and S2 are normal. No murmur or click heard., no S3 or S4.   ABDOMEN:   There is no distention present. Liver and spleen are not palpable.  No other mass or tenderness present.   NEUROLOGICAL:   Ankle jerks are absent bilaterally.    Diabetic Foot Exam - Simple   Simple Foot Form Diabetic Foot exam was performed with the following findings:  Yes   Visual Inspection No deformities, no ulcerations, no other skin breakdown bilaterally:  Yes Sensation Testing Intact to touch and monofilament testing bilaterally:  Yes Pulse Check Posterior Tibialis and Dorsalis pulse intact bilaterally:  Yes Comments            Vibration sense is Mildly reduced in distal first toes. MUSCULOSKELETAL:  There is no swelling or deformity of the peripheral joints.  Spine is normal to inspection.   EXTREMITIES:     There is no edema.  SKIN:       No rash or lesions of concern.        ASSESSMENT:  Diabetes type 2, uncontrolled She has persistently poor control and is currently on a regimen of basal insulin, metformin and glipizide A1c has been persistently over 8% and nearly 10% about 2 months ago She has not monitored blood sugars at home, last blood sugar was 200 in the office Although she may have significant postprandial hyperglycemia also in addition to overnight hypoglycemia not clear what patterns she has Does however need to lose weight and can do better with cutting back on portions     Complications of diabetes: None evident, minimal objective signs on exam No history of nephropathy but does need to 9 exam which she has not had in several years  No significant hypertension or history of hyperlipidemia, is on gemfibrozil long-term baseline lipids not available  PLAN:   Trial of GLP-1 drug to help her control. Discussed with the patient the nature of GLP-1 drugs, the action on various  organ systems, how they benefit blood glucose control, as well as the benefit of weight loss and  increase satiety . Explained possible side effects, particularly nausea and vomiting that usually resolve over time;  Demonstrated the medication injection device and injection technique to the patient.  Showed patient where to inject the medication. To start with 0.25 mg dosage weekly for the first 2 weeks and then 0.5 mg weekly. She was also given more information by the nurse educator Start glucose monitoring with the freestyle LIBRE sensor She will be instructed on this in detail by the nurse educator also She needs to follow-up in 4 weeks to review her blood sugar patterns Meanwhile she will  continue the same dose of Lantus but advised her to let us know if she feels hypoglycemic Will consider adding mealtime insulin if postprandial readings are consistently high despite optimal dose of Ozempic No change in metformin, most likely can  stop her glipizide on the next visit She needs regular eye exams, her son will try to schedule her     Patient Instructions  Take Ozempic 0.25mg  weekly for 2 shots then 0.5mg  weekly    Consultation note has been sent to the referring physician  [facility] 08/03/2016, 12:45 PM   Note: This office note was prepared with Dragon voice recognition system technology. Any transcriptional errors that result from this process are unintentional.

## 2016-08-03 ENCOUNTER — Other Ambulatory Visit: Payer: Self-pay

## 2016-08-03 ENCOUNTER — Ambulatory Visit (INDEPENDENT_AMBULATORY_CARE_PROVIDER_SITE_OTHER): Payer: BLUE CROSS/BLUE SHIELD | Admitting: Endocrinology

## 2016-08-03 ENCOUNTER — Encounter: Payer: No Typology Code available for payment source | Attending: Endocrinology | Admitting: Nutrition

## 2016-08-03 ENCOUNTER — Encounter: Payer: Self-pay | Admitting: Endocrinology

## 2016-08-03 VITALS — BP 108/70 | HR 93 | Ht 64.0 in | Wt 173.0 lb

## 2016-08-03 DIAGNOSIS — Z794 Long term (current) use of insulin: Secondary | ICD-10-CM | POA: Insufficient documentation

## 2016-08-03 DIAGNOSIS — IMO0001 Reserved for inherently not codable concepts without codable children: Secondary | ICD-10-CM

## 2016-08-03 DIAGNOSIS — Z713 Dietary counseling and surveillance: Secondary | ICD-10-CM | POA: Insufficient documentation

## 2016-08-03 DIAGNOSIS — E1165 Type 2 diabetes mellitus with hyperglycemia: Secondary | ICD-10-CM | POA: Diagnosis not present

## 2016-08-03 MED ORDER — FREESTYLE LIBRE READER DEVI: 1.0000 | Freq: Once | 1 refills | Status: DC

## 2016-08-03 MED ORDER — FREESTYLE LIBRE SENSOR SYSTEM MISC: 1.0000 | 3 refills | Status: DC

## 2016-08-03 MED ORDER — INSULIN GLARGINE 100 UNIT/ML SOLOSTAR PEN: 50.0000 [IU] | PEN_INJECTOR | Freq: Every day | SUBCUTANEOUS | 3 refills | Status: DC

## 2016-08-03 NOTE — Patient Instructions (Signed)
Take Ozempic 0.25mg  weekly for 2 shots then 0.5mg  weekly

## 2016-08-04 NOTE — Patient Instructions (Signed)
Take Osempia 0.25 once a week for 4 weeks. Increase the dose to 0.5 once a week.

## 2016-08-04 NOTE — Progress Notes (Signed)
Patient and her son were here to learn about the drug Osempic, and how to take it.  We discussed how to drug works to lower blood sugar, and how to use the pen and adjust the dose.   They were show how to attach the needle and dial in 0.25units.  Written instructions were given to take this dose once a week for 4 weeks, and then increase the dose to 0.5. We discussed the side effects and what to do if she gets nauseated.  They reported good understanding of this. They were also shown how to use the personal libre CGM.  The son will do this, and he was shown how to combine the 2 sensors and inject this into the back of her arm.  He reported good understanding of this.  He was also shown how to start the sensor, with the directions that it will take 12 hours before being able to read the glucoses.  They were both show how to use it to read the results, and reported good understanding of this.   They had no final questions.

## 2016-08-05 ENCOUNTER — Ambulatory Visit (INDEPENDENT_AMBULATORY_CARE_PROVIDER_SITE_OTHER): Payer: No Typology Code available for payment source | Admitting: Physician Assistant

## 2016-08-05 VITALS — BP 112/70 | HR 103 | Temp 98.1°F | Ht 64.0 in | Wt 172.6 lb

## 2016-08-05 DIAGNOSIS — E785 Hyperlipidemia, unspecified: Secondary | ICD-10-CM | POA: Diagnosis not present

## 2016-08-05 DIAGNOSIS — R509 Fever, unspecified: Secondary | ICD-10-CM

## 2016-08-05 DIAGNOSIS — J989 Respiratory disorder, unspecified: Secondary | ICD-10-CM

## 2016-08-05 DIAGNOSIS — R05 Cough: Secondary | ICD-10-CM

## 2016-08-05 DIAGNOSIS — E1165 Type 2 diabetes mellitus with hyperglycemia: Secondary | ICD-10-CM | POA: Diagnosis not present

## 2016-08-05 DIAGNOSIS — Z794 Long term (current) use of insulin: Secondary | ICD-10-CM | POA: Diagnosis not present

## 2016-08-05 DIAGNOSIS — R059 Cough, unspecified: Secondary | ICD-10-CM

## 2016-08-05 DIAGNOSIS — IMO0001 Reserved for inherently not codable concepts without codable children: Secondary | ICD-10-CM

## 2016-08-05 MED ORDER — INSULIN GLARGINE 100 UNIT/ML SOLOSTAR PEN: 50.0000 [IU] | PEN_INJECTOR | Freq: Every day | SUBCUTANEOUS | 3 refills | Status: DC

## 2016-08-05 MED ORDER — FREESTYLE LIBRE SENSOR SYSTEM MISC: 1.0000 | 3 refills | Status: DC

## 2016-08-05 MED ORDER — METFORMIN HCL 1000 MG PO TABS: ORAL_TABLET | ORAL | 3 refills | Status: DC

## 2016-08-05 MED ORDER — BENZONATATE 100 MG PO CAPS: 100.0000 mg | ORAL_CAPSULE | Freq: Three times a day (TID) | ORAL | 0 refills | Status: DC | PRN

## 2016-08-05 MED ORDER — OSELTAMIVIR PHOSPHATE 75 MG PO CAPS: 75.0000 mg | ORAL_CAPSULE | Freq: Two times a day (BID) | ORAL | 0 refills | Status: DC

## 2016-08-05 MED ORDER — INSULIN PEN NEEDLE 31G X 5 MM MISC: 3 refills | Status: DC

## 2016-08-05 MED ORDER — GLIPIZIDE ER 10 MG PO TB24: 10.0000 mg | ORAL_TABLET | Freq: Every day | ORAL | 3 refills | Status: DC

## 2016-08-05 MED ORDER — LISINOPRIL 2.5 MG PO TABS: 2.5000 mg | ORAL_TABLET | Freq: Every day | ORAL | 3 refills | Status: DC

## 2016-08-05 MED ORDER — GEMFIBROZIL 600 MG PO TABS: 600.0000 mg | ORAL_TABLET | Freq: Two times a day (BID) | ORAL | 3 refills | Status: DC

## 2016-08-05 NOTE — Progress Notes (Signed)
Patient ID: Shelley Escobar, female    DOB: [birth year], 46 y.o.   MRN: [id number]  PCP: Harrison Mons, PA-C  Chief Complaint  Patient presents with  . Cough    X1 day  . Dizziness    X 1 day    Subjective:   Presents for evaluation of cough and headache. She is accompanied by her son, who helps with translation.  Onset yesterday of cough, headache, fever and chills. Initially reported dizziness, but upon clarification she is not experiencing dizziness, vertigo or loss of balance. No pre-syncope, light-headedness. Felt really tired this morning, generalized malaise.  Normal vision. No nausea, vomiting or diarrhea. No urinary changes. No muscle pain. Cough produces yellow mucous. No runny or stuffy nose. Sore throat.  Saw endocrinology yesterday and started a new product-Ozempic. She took the first injection last night at [facility].  Her son had influenza about 3 weeks ago and was treated with Tamiflu.    Review of Systems As above. No CP, SOB, dizziness.    Patient Active Problem List   Diagnosis Date Noted  . Fatty infiltration of liver 03/10/2016  . Anemia 03/10/2016  . Obesity (BMI 30.0-34.9) 01/27/2012  . Diabetes mellitus type 2, uncontrolled, without complications ([place]) [id number]  . Dyslipidemia 07/02/2011     Prior to Admission medications   Medication Sig Start Date End Date Taking? Authorizing Provider  Continuous Blood Gluc Sensor (FREESTYLE LIBRE SENSOR SYSTEM) MISC 1 patch by Does not apply route continuous. [id number] 08/03/16  Yes Elayne Snare, MD  gemfibrozil (LOPID) 600 MG tablet Take 1 tablet (600 mg total) by mouth 2 (two) times daily before a meal. 08/25/15  Yes Steward Sames, PA-C  glipiZIDE (GLUCOTROL XL) 10 MG 24 hr tablet Take 1 tablet (10 mg total) by mouth daily with breakfast. 08/25/15  Yes Seairra Otani, PA-C  HYDROcodone-acetaminophen (NORCO) 5-325 MG tablet Take 1-2 tablets by mouth every 6 (six) hours as needed. 01/14/16  Yes Fanny Skates, MD  Insulin Glargine (LANTUS SOLOSTAR) 100 UNIT/ML Solostar Pen Inject 50 Units into the skin daily. 08/03/16  Yes Elayne Snare, MD  Insulin Pen Needle (B-D UF III MINI PEN NEEDLES) 31G X 5 MM MISC USE DAILY AS DIRECTED WITH INSULIN 07/01/16  Yes Price Lachapelle, PA-C  lisinopril (ZESTRIL) 2.5 MG tablet Take 1 tablet (2.5 mg total) by mouth daily. 03/08/16  Yes Wonda Goodgame, PA-C  metFORMIN (GLUCOPHAGE) 1000 MG tablet TAKE 1 TABLET(1000 MG) BY MOUTH TWICE DAILY WITH A MEAL 03/08/16  Yes Gavin Faivre, PA-C     No Known Allergies     Objective:  Physical Exam  Constitutional: She is oriented to person, place, and time. She appears well-developed and well-nourished. She is active and cooperative. No distress.  BP 112/70 (BP Location: Right Arm, Patient Position: Sitting, Cuff Size: Small)   Pulse (!) 103   Temp 98.1 F (36.7 C) (Oral)   Ht 5\' 4"  (1.626 m)   Wt 172 lb 9.6 oz (78.3 kg)   SpO2 96%   BMI 29.63 kg/m   HENT:  Head: Normocephalic and atraumatic.  Right Ear: Hearing, tympanic membrane, external ear and ear canal normal.  Left Ear: Hearing, tympanic membrane, external ear and ear canal normal.  Nose: Mucosal edema (mild) present.  Mouth/Throat: Uvula is midline, oropharynx is clear and moist and mucous membranes are normal.  Eyes: Conjunctivae are normal. No scleral icterus.  Neck: Normal range of motion. Neck supple. No thyromegaly present.  Cardiovascular: Normal rate, regular rhythm  and normal heart sounds.   Pulses:      Radial pulses are 2+ on the right side, and 2+ on the left side.  Pulmonary/Chest: Effort normal and breath sounds normal.  Lymphadenopathy:       Head (right side): No tonsillar, no preauricular, no posterior auricular and no occipital adenopathy present.       Head (left side): No tonsillar, no preauricular, no posterior auricular and no occipital adenopathy present.    She has no cervical adenopathy.       Right: No supraclavicular adenopathy  present.       Left: No supraclavicular adenopathy present.  Neurological: [name], place, and time. No sensory deficit.  Skin: Skin is warm, dry and intact. No rash noted. No cyanosis or erythema. Nails show no clubbing.  Psychiatric: She has a normal mood and affect. Her speech is normal and behavior is normal.           Assessment & Plan:   1. Cough Likely due to #2 - benzonatate (TESSALON) 100 MG capsule; Take 1-2 capsules (100-200 mg total) by mouth 3 (three) times daily as needed for cough.  Dispense: 40 capsule; Refill: 0  2. Respiratory illness with fever Suspect influenza, despite lack of muscle aches and afebrile here. Given increased risk of complications, elect to add oseltamivir to supportive care. - oseltamivir (TAMIFLU) 75 MG capsule; Take 1 capsule (75 mg total) by mouth 2 (two) times daily.  Dispense: 10 capsule; Refill: 0  Patient has new insurance, and it doesn't work well with the [employer]. Her son asks that I send ALL her medications to [facility].  3. Uncontrolled type 2 diabetes mellitus without complication, with long-term current use of insulin ([facility]) - Insulin Glargine (LANTUS SOLOSTAR) 100 UNIT/ML Solostar Pen; Inject 50 Units into the skin daily.  Dispense: 5 pen; Refill: 3 - metFORMIN (GLUCOPHAGE) 1000 MG tablet; TAKE 1 TABLET(1000 MG) BY MOUTH TWICE DAILY WITH A MEAL  Dispense: 180 tablet; Refill: 3 - lisinopril (ZESTRIL) 2.5 MG tablet; Take 1 tablet (2.5 mg total) by mouth daily.  Dispense: 90 tablet; Refill: 3 - Insulin Pen Needle (B-D UF III MINI PEN NEEDLES) 31G X 5 MM MISC; USE DAILY AS DIRECTED WITH INSULIN  Dispense: 100 each; Refill: 3 - glipiZIDE (GLUCOTROL XL) 10 MG 24 hr tablet; Take 1 tablet (10 mg total) by mouth daily with breakfast.  Dispense: 90 tablet; Refill: 3 - Continuous Blood Gluc Sensor ([place]) MISC; 1 patch by Does not apply route continuous. [id number]   Dispense: 3 each; Refill: 3  4. Dyslipidemia - gemfibrozil (LOPID) 600 MG tablet; Take 1 tablet (600 mg total) by mouth 2 (two) times daily before a meal.  Dispense: 180 tablet; Refill: 3   Fara Chute, PA-C Physician Assistant-Certified [facility] [place]

## 2016-08-05 NOTE — Patient Instructions (Addendum)
   IF you received an x-ray today, you will receive an invoice from [facility]. Please contact [facility] at [phone] with questions or concerns regarding your invoice.   IF you received labwork today, you will receive an invoice from [facility]. Please contact [facility] at [phone] with questions or concerns regarding your invoice.   Our billing staff will not be able to assist you with questions regarding bills from these companies.  You will be contacted with the lab results as soon as they are available. The fastest way to get your results is to activate your My Chart account. Instructions are located on the last page of this paperwork. If you have not heard from us regarding the results in 2 weeks, please contact this office.      Influenza, Adult Influenza, more commonly known as "the flu," is a viral infection that primarily affects the respiratory tract. The respiratory tract includes organs that help you breathe, such as the lungs, nose, and throat. The flu causes many common cold symptoms, as well as a high fever and body aches. The flu spreads easily from person to person (is contagious). Getting a flu shot (influenza vaccination) every year is the best way to prevent influenza. What are the causes? Influenza is caused by a virus. You can catch the virus by:  Breathing in droplets from an infected person's cough or sneeze.  Touching something that was recently contaminated with the virus and then touching your mouth, nose, or eyes.  What increases the risk? The following factors may make you more likely to get the flu:  Not cleaning your hands frequently with soap and water or alcohol-based hand sanitizer.  Having close contact with many people during cold and flu season.  Touching your mouth, eyes, or nose without washing or sanitizing your hands first.  Not drinking enough fluids or not eating a healthy diet.  Not getting enough sleep or  exercise.  Being under a high amount of stress.  Not getting a yearly (annual) flu shot.  You may be at a higher risk of complications from the flu, such as a severe lung infection (pneumonia), if you:  Are over the age of 65.  Are pregnant.  Have a weakened disease-fighting system (immune system). You may have a weakened immune system if you: ? Have HIV or AIDS. ? Are undergoing chemotherapy. ? Aretaking medicines that reduce the activity of (suppress) the immune system.  Have a long-term (chronic) illness, such as heart disease, kidney disease, diabetes, or lung disease.  Have a liver disorder.  Are obese.  Have anemia.  What are the signs or symptoms? Symptoms of this condition typically last 4-10 days and may include:  Fever.  Chills.  Headache, body aches, or muscle aches.  Sore throat.  Cough.  Runny or congested nose.  Chest discomfort and cough.  Poor appetite.  Weakness or tiredness (fatigue).  Dizziness.  Nausea or vomiting.  How is this diagnosed? This condition may be diagnosed based on your medical history and a physical exam. Your health care provider may do a nose or throat swab test to confirm the diagnosis. How is this treated? If influenza is detected early, you can be treated with antiviral medicine that can reduce the length of your illness and the severity of your symptoms. This medicine may be given by mouth (orally) or through an IV tube that is inserted in one of your veins. The goal of treatment is to relieve symptoms by taking   care of yourself at home. This may include taking over-the-counter medicines, drinking plenty of fluids, and adding humidity to the air in your home. In some cases, influenza goes away on its own. Severe influenza or complications from influenza may be treated in a hospital. Follow these instructions at home:  Take over-the-counter and prescription medicines only as told by your health care provider.  Use a  cool mist humidifier to add humidity to the air in your home. This can make breathing easier.  Rest as needed.  Drink enough fluid to keep your urine clear or pale yellow.  Cover your mouth and nose when you cough or sneeze.  Wash your hands with soap and water often, especially after you cough or sneeze. If soap and water are not available, use hand sanitizer.  Stay home from work or school as told by your health care provider. Unless you are visiting your health care provider, try to avoid leaving home until your fever has been gone for 24 hours without the use of medicine.  Keep all follow-up visits as told by your health care provider. This is important. How is this prevented?  Getting an annual flu shot is the best way to avoid getting the flu. You may get the flu shot in late summer, fall, or winter. Ask your health care provider when you should get your flu shot.  Wash your hands often or use hand sanitizer often.  Avoid contact with people who are sick during cold and flu season.  Eat a healthy diet, drink plenty of fluids, get enough sleep, and exercise regularly. Contact a health care provider if:  You develop new symptoms.  You have: ? Chest pain. ? Diarrhea. ? A fever.  Your cough gets worse.  You produce more mucus.  You feel nauseous or you vomit. Get help right away if:  You develop shortness of breath or difficulty breathing.  Your skin or nails turn a bluish color.  You have severe pain or stiffness in your neck.  You develop a sudden headache or sudden pain in your face or ear.  You cannot stop vomiting. This information is not intended to replace advice given to you by your health care provider. Make sure you discuss any questions you have with your health care provider. Document Released: 06/03/2000 Document Revised: 11/12/2015 Document Reviewed: 03/31/2015 Elsevier Interactive Patient Education  2017 [employer]  

## 2016-09-06 ENCOUNTER — Ambulatory Visit: Payer: BLUE CROSS/BLUE SHIELD | Admitting: Physician Assistant

## 2016-09-07 ENCOUNTER — Encounter: Payer: Self-pay | Admitting: Endocrinology

## 2016-09-07 ENCOUNTER — Ambulatory Visit (INDEPENDENT_AMBULATORY_CARE_PROVIDER_SITE_OTHER): Payer: No Typology Code available for payment source | Admitting: Endocrinology

## 2016-09-07 VITALS — BP 108/70 | HR 85 | Ht 64.0 in | Wt 169.0 lb

## 2016-09-07 DIAGNOSIS — Z794 Long term (current) use of insulin: Secondary | ICD-10-CM | POA: Diagnosis not present

## 2016-09-07 DIAGNOSIS — E1165 Type 2 diabetes mellitus with hyperglycemia: Secondary | ICD-10-CM

## 2016-09-07 MED ORDER — SEMAGLUTIDE(0.25 OR 0.5MG/DOS) 2 MG/1.5ML ~~LOC~~ SOPN: 0.5000 mg | PEN_INJECTOR | SUBCUTANEOUS | 3 refills | Status: DC

## 2016-09-07 NOTE — Progress Notes (Signed)
Patient ID: Shelley Escobar, female   DOB: [birth year], 46 y.o.   MRN: [id number]            Reason for Appointment: f/u for Type 2 Diabetes  Referring physician: Daphane Shepherd   History of Present Illness:          Date of diagnosis of type 2 diabetes mellitus:2000        Background history:   At the time of diagnosis she was having symptoms of dizziness and was initially given metformin; later she was also given Janumet. Her level of control has been consistently inadequate with A1c over 7% since at least 2013 Blood sugars however has been poorly controlled in 2013 with A1c consistently over 8%  Recent history:   She has been referred here because of persistently poor control and A1c of 9.9 done in 12/17  INSULIN regimen is: 50 U  Lantus hs    Non-insulin hypoglycemic drugs the patient is taking are: Metformin 1 g twice a day, glipizide ER 10 mg daily, Ozempic 0.5 mg weekly  Current management, blood sugar patterns and problems identified:  She is not using the freestyle Libre sensor for monitoring her blood sugar, she says that she is willing to continue this because of convenience even though it is costing her $75  She did start taking the Ozempic but not clear if she got the full doses injected as at least once after injection she still had some medication left.  She did start taking the 0.5 dosage last week and has no nausea  She says her portions small, usually has only about half a cup of rice at a time at a meal  Her weight is started to come down  She is starting to feel hypoglycemic with low normal sugars before breakfast and occasionally before supper also        Side effects from medications have been: None  Compliance with the medical regimen: Fair Hypoglycemia:   never  Glucose monitoring:  using FreeStyle Libre  CGM report:   Average glucose 124 with standard deviation 45  Target range 70-140, blood sugars 52% and this range  Above target blood  sugars 35% and below 70 or 13%  Patient is scanning on average 4 times a day  LOWEST blood sugar between 2 AM-4 AM with average 90 has been in the low range overnight about 6 times in the 2 weeks  HIGHEST blood sugars 12 noon-2 PM with average 161 and some variability present  As inadequate data for monitoring between about 8:30 PM-10:30 PM   Self-care: The diet that the patient has been following is: tries to limit fried food and drinks with sugar .     Meal times are:  Breakfast is at Lunch: 12 noon Dinner: 9 pm   Typical meal intake: Breakfast is none              Dietician visit, most recent: Never               Exercise: Walking 15-30 minutes in the evenings at least 4-5 days a week   Weight history:  Wt Readings from Last 3 Encounters:  09/07/16 169 lb (76.7 kg)  08/05/16 172 lb 9.6 oz (78.3 kg)  08/03/16 173 lb (78.5 kg)    Glycemic control:   Lab Results  Component Value Date   HGBA1C 9.9 (H) 06/07/2016   HGBA1C 8.7 (H) 03/08/2016   HGBA1C 8.3 (H) 08/25/2015   Lab Results  Component Value Date   MICROALBUR 1.1 03/08/2016   LDLCALC 37 06/07/2016   CREATININE 0.63 06/07/2016   No results found for: MICRALBCREAT   Other problems: See review of systems   Allergies as of 09/07/2016   No Known Allergies     Medication List       Accurate as of 09/07/16 11:59 AM. Always use your most recent med list.          benzonatate 100 MG capsule Commonly known as:  TESSALON Take 1-2 capsules (100-200 mg total) by mouth 3 (three) times daily as needed for cough.   FREESTYLE LIBRE SENSOR SYSTEM Misc 1 patch by Does not apply route continuous. [id number]   gemfibrozil 600 MG tablet Commonly known as:  LOPID Take 1 tablet (600 mg total) by mouth 2 (two) times daily before a meal.   HYDROcodone-acetaminophen 5-325 MG tablet Commonly known as:  NORCO Take 1-2 tablets by mouth every 6 (six) hours as needed.   Insulin Glargine 100 UNIT/ML Solostar  Pen Commonly known as:  LANTUS SOLOSTAR Inject 50 Units into the skin daily.   Insulin Pen Needle 31G X 5 MM Misc Commonly known as:  B-D UF III MINI PEN NEEDLES USE DAILY AS DIRECTED WITH INSULIN   lisinopril 2.5 MG tablet Commonly known as:  ZESTRIL Take 1 tablet (2.5 mg total) by mouth daily.   metFORMIN 1000 MG tablet Commonly known as:  GLUCOPHAGE TAKE 1 TABLET(1000 MG) BY MOUTH TWICE DAILY WITH A MEAL   oseltamivir 75 MG capsule Commonly known as:  TAMIFLU Take 1 capsule (75 mg total) by mouth 2 (two) times daily.   Semaglutide 0.25 or 0.5 MG/DOSE Sopn Commonly known as:  OZEMPIC Inject 0.5 mg into the skin once a week.       Allergies: No Known Allergies  Past Medical History:  Diagnosis Date  . Acute appendicitis 01/13/2016  . Anemia    hx  . DM (diabetes mellitus) ([place])   . Hyperlipidemia   . SVD (spontaneous vaginal delivery)    x 3  . Vitamin D deficiency     Past Surgical History:  Procedure Laterality Date  . HYSTEROSCOPY W/D&C N/A 08/07/2013   Procedure: DILATATION AND CURETTAGE /HYSTEROSCOPY;  Surgeon: Azalia Bilis, MD;  Location: [facility];  Service: Gynecology;  Laterality: N/A;  . INTRAUTERINE DEVICE (IUD) INSERTION  09/13/2013  . LAPAROSCOPIC APPENDECTOMY N/A 01/13/2016   Procedure: APPENDECTOMY LAPAROSCOPIC;  Surgeon: Armandina Gemma, MD;  Location: [facility];  Service: General;  Laterality: N/A;  . TUBAL LIGATION     Age 77    Family History  Problem Relation Age of Onset  . Adopted: Yes  . Family history unknown: Yes    Social History:  reports that she has never smoked. She has never used smokeless tobacco. She reports that she does not drink alcohol or use drugs.   Review of Systems   Lipid history: Is on a long-term treatment regimen of gemfibrozil only Normal LDL Present   Lab Results  Component Value Date   CHOL 86 (L) 06/07/2016   HDL 28 (L) 06/07/2016   LDLCALC 37 06/07/2016   TRIG 103 06/07/2016   CHOLHDL 3.1 06/07/2016             Most recent eye exam was never  Most recent foot exam: 1/18  Has history of abnormal liver functions   LABS:  No visits with results within 1 Week(s) from this visit.  Latest known visit with results is:  Office Visit on 06/07/2016  Component Date Value Ref Range Status  . Hgb A1c MFr Bld 06/07/2016 9.9* 4.8 - 5.6 % Final   Comment:          Pre-diabetes: 5.7 - 6.4          Diabetes: >6.4          Glycemic control for adults with diabetes: <7.0   . Est. average glucose Bld gHb Est-m* 06/07/2016 237  mg/dL Final  . Glucose 06/07/2016 201* 65 - 99 mg/dL Final  . BUN 06/07/2016 14  6 - 24 mg/dL Final  . Creatinine, Ser 06/07/2016 0.63  0.57 - 1.00 mg/dL Final  . GFR calc non Af Amer 06/07/2016 107  >59 mL/min/1.73 Final  . GFR calc Af Amer 06/07/2016 124  >59 mL/min/1.73 Final  . BUN/Creatinine Ratio 06/07/2016 22  9 - 23 Final  . Sodium 06/07/2016 138  134 - 144 mmol/L Final  . Potassium 06/07/2016 4.6  3.5 - 5.2 mmol/L Final  . Chloride 06/07/2016 99  96 - 106 mmol/L Final  . CO2 06/07/2016 23  18 - 29 mmol/L Final  . Calcium 06/07/2016 10.1  8.7 - 10.2 mg/dL Final  . Total Protein 06/07/2016 8.1  6.0 - 8.5 g/dL Final  . Albumin 06/07/2016 4.5  3.5 - 5.5 g/dL Final  . Globulin, Total 06/07/2016 3.6  1.5 - 4.5 g/dL Final  . Albumin/Globulin Ratio 06/07/2016 1.3  1.2 - 2.2 Final  . Bilirubin Total 06/07/2016 0.3  0.0 - 1.2 mg/dL Final  . Alkaline Phosphatase 06/07/2016 70  39 - 117 IU/L Final  . AST 06/07/2016 57* 0 - 40 IU/L Final  . ALT 06/07/2016 47* 0 - 32 IU/L Final  . WBC 06/07/2016 8.2  3.4 - 10.8 x10E3/uL Final  . RBC 06/07/2016 4.58  3.77 - 5.28 x10E6/uL Final  . Hemoglobin 06/07/2016 10.7* 11.1 - 15.9 g/dL Final  . Hematocrit 06/07/2016 33.5* 34.0 - 46.6 % Final  . MCV 06/07/2016 73* 79 - 97 fL Final  . MCH 06/07/2016 23.4* 26.6 - 33.0 pg Final  . MCHC 06/07/2016 31.9  31.5 - 35.7 g/dL Final  . RDW 06/07/2016 13.4  12.3 - 15.4 % Final  . Platelets  06/07/2016 431* 150 - 379 x10E3/uL Final  . Neutrophils 06/07/2016 59  Not Estab. % Final  . Lymphs 06/07/2016 27  Not Estab. % Final  . Monocytes 06/07/2016 7  Not Estab. % Final  . Eos 06/07/2016 6  Not Estab. % Final  . Basos 06/07/2016 1  Not Estab. % Final  . Neutrophils Absolute 06/07/2016 4.8  1.4 - 7.0 x10E3/uL Final  . Lymphocytes Absolute 06/07/2016 2.2  0.7 - 3.1 x10E3/uL Final  . Monocytes Absolute 06/07/2016 0.6  0.1 - 0.9 x10E3/uL Final  . EOS (ABSOLUTE) 06/07/2016 0.5* 0.0 - 0.4 x10E3/uL Final  . Basophils Absolute 06/07/2016 0.0  0.0 - 0.2 x10E3/uL Final  . Immature Granulocytes 06/07/2016 0  Not Estab. % Final  . Immature Grans (Abs) 06/07/2016 0.0  0.0 - 0.1 x10E3/uL Final  . Cholesterol, Total 06/07/2016 86* 100 - 199 mg/dL Final  . Triglycerides 06/07/2016 103  0 - 149 mg/dL Final  . HDL 06/07/2016 28* >39 mg/dL Final  . VLDL Cholesterol Cal 06/07/2016 21  5 - 40 mg/dL Final  . LDL Calculated 06/07/2016 37  0 - 99 mg/dL Final  . Chol/HDL Ratio 06/07/2016 3.1  0.0 - 4.4 ratio units Final   Comment:  T. Chol/HDL Ratio                                             Men  Women                               1/2 Avg.Risk  3.4    3.3                                   Avg.Risk  5.0    4.4                                2X Avg.Risk  9.6    7.1                                3X Avg.Risk 23.4   11.0   . TSH 06/07/2016 1.170  0.450 - 4.500 uIU/mL Final  . Color, UA 06/07/2016 yellow  yellow Final  . Clarity, UA 06/07/2016 clear  clear Final  . Glucose, UA 06/07/2016 negative  negative Final  . Bilirubin, UA 06/07/2016 negative  negative Final  . Ketones, POC UA 06/07/2016 negative  negative Final  . Spec Grav, UA 06/07/2016 1.015   Final  . Blood, UA 06/07/2016 negative  negative Final  . pH, UA 06/07/2016 5.0   Final  . Protein Ur, POC 06/07/2016 negative  negative Final  . Urobilinogen, UA 06/07/2016 0.2   Final  . Nitrite, UA 06/07/2016  Negative  Negative Final  . Leukocytes, UA 06/07/2016 Negative  Negative Final  . DIAGNOSIS: 06/07/2016 Comment   Final  . Specimen adequacy: 06/07/2016 Comment   Final   Comment: Satisfactory for evaluation. Endocervical and/or squamous metaplastic cells (endocervical component) are present.   Marland Kitchen CLINICIAN PROVIDED ICD10: 06/07/2016 Comment   Final  . Performed by: 06/07/2016 Comment   Final  . PAP SMEAR COMMENT 06/07/2016 .   Final  . Note: 06/07/2016 Comment   Final   Comment: The Pap smear is a screening test designed to aid in the detection of premalignant and malignant conditions of the uterine cervix.  It is not a diagnostic procedure and should not be used as the sole means of detecting cervical cancer.  Both false-positive and false-negative reports do occur.   . Test Methodology 06/07/2016 Comment   Final   Comment: This liquid based ThinPrep(R) pap test was screened with the use of an image guided system.   . HPV, high-risk 06/07/2016 CANCELED   Final-Edited   Comment: The quantity of specimen remaining in the vial after Pap slide preparation was less than the 4 mL minimum cell suspension required. Low sample cellularity may be the cause.  See HPV, low volume rfx test result. This high-risk HPV test detects thirteen high-risk types (16/18/31/33/35/39/45/51/52/56/58/59/68) without differentiation.  Result canceled by the ancillary   . WBC, UA 06/07/2016 0-5  0 - 5 /hpf Final  . RBC, UA 06/07/2016 0-2  0 - 2 /hpf Final  . Epithelial Cells (non renal) 06/07/2016 0-10  0 - 10 /hpf Final  . Casts 06/07/2016 None seen  None seen /lpf Final  .  Mucus, UA 06/07/2016 Present  Not Estab. Final  . Bacteria, UA 06/07/2016 Few  None seen/Few Final  . HPV low volume reflex 06/07/2016 Negative  Negative Final   Comment: This test detects fourteen high-risk HPV types (16,18,31,33,35,39,45, 51,52,56,58,59,66,68) without differentiation.     Physical Examination:  BP 108/70    Pulse 85   Ht 5\' 4"  (1.626 m)   Wt 169 lb (76.7 kg)   SpO2 98%   BMI 29.01 kg/m    ASSESSMENT:  Diabetes type 2, uncontrolled See history of present illness for detailed discussion of current diabetes management, blood sugar patterns and problems identified  Patient has had a dramatic improvement in her blood sugars with starting Ozempic even with using mostly 0.25 mg weekly However she may not have had the full dose injected because of faulty technique initially Her blood sugars have been averaging about 103-144 last 10 days or so including no significant postprandial hyperglycemia recently With continuing her Lantus 50 units she is now starting get mildly hypoglycemic overnight and sometimes before supper She has a baseline A1c of 9.9%   PLAN:    Continue Ozempic 0.5 mg weekly.  She needs to hold the pen for 10 seconds after the injection Reduce insulin to 40 units She will start taking the Lantus in the morning instead of bedtime Discussed titration of her Lantus insulin every 3 days using the flow sheet provided, details discussed and she will try to keep her blood sugar between 90-130 with adjustment up or down as needed by 2-4 units She needs to scan her blood sugars after supper more regularly. Needs follow-up of liver functions She needs regular eye exams, her son will try to schedule her  Stop glipizide and continue metformin    Patient Instructions  Insulin 40 units daily, take in am not nite, adjust dose as on sheet  Stop Glipizide   Counseling time on subjects discussed above is over 50% of today's 25 minute visit     Henny Strauch 09/07/2016, 11:59 AM   Note: This office note was prepared with Estate agent. Any transcriptional errors that result from this process are unintentional.

## 2016-09-07 NOTE — Patient Instructions (Signed)
Insulin 40 units daily, take in am not nite, adjust dose as on sheet  Stop Glipizide

## 2016-09-09 ENCOUNTER — Telehealth: Payer: Self-pay | Admitting: Endocrinology

## 2016-09-09 ENCOUNTER — Other Ambulatory Visit: Payer: Self-pay

## 2016-09-09 MED ORDER — DULAGLUTIDE 1.5 MG/0.5ML ~~LOC~~ SOAJ: 1.5000 mg | SUBCUTANEOUS | 3 refills | Status: DC

## 2016-09-09 NOTE — Telephone Encounter (Signed)
Please change to Trulicity 1.5 mg weekly

## 2016-09-09 NOTE — Telephone Encounter (Signed)
Patient medication ozempic is to expensive, insurance will not cover it and the pharmacy will not honor the coupon. Please advise

## 2016-09-09 NOTE — Telephone Encounter (Signed)
This has been ordered 

## 2016-10-17 ENCOUNTER — Other Ambulatory Visit (INDEPENDENT_AMBULATORY_CARE_PROVIDER_SITE_OTHER): Payer: No Typology Code available for payment source

## 2016-10-17 DIAGNOSIS — E1165 Type 2 diabetes mellitus with hyperglycemia: Secondary | ICD-10-CM

## 2016-10-17 DIAGNOSIS — Z794 Long term (current) use of insulin: Secondary | ICD-10-CM | POA: Diagnosis not present

## 2016-10-17 LAB — COMPREHENSIVE METABOLIC PANEL
ALBUMIN: 4.2 g/dL (ref 3.5–5.2)
ALK PHOS: 54 U/L (ref 39–117)
ALT: 29 U/L (ref 0–35)
AST: 34 U/L (ref 0–37)
BILIRUBIN TOTAL: 0.3 mg/dL (ref 0.2–1.2)
BUN: 21 mg/dL (ref 6–23)
CALCIUM: 9.8 mg/dL (ref 8.4–10.5)
CO2: 28 meq/L (ref 19–32)
Chloride: 104 mEq/L (ref 96–112)
Creatinine, Ser: 0.62 mg/dL (ref 0.40–1.20)
GFR: 109.1 mL/min (ref >60.00)
Glucose, Bld: 98 mg/dL (ref 70–99)
Potassium: 4.5 mEq/L (ref 3.5–5.1)
Sodium: 136 mEq/L (ref 135–145)
TOTAL PROTEIN: 8.1 g/dL (ref 6.0–8.3)

## 2016-10-18 LAB — FRUCTOSAMINE: Fructosamine: 250 umol/L (ref 0–285)

## 2016-10-19 ENCOUNTER — Ambulatory Visit (INDEPENDENT_AMBULATORY_CARE_PROVIDER_SITE_OTHER): Payer: No Typology Code available for payment source | Admitting: Endocrinology

## 2016-10-19 ENCOUNTER — Encounter: Payer: Self-pay | Admitting: Endocrinology

## 2016-10-19 VITALS — BP 110/86 | HR 86 | Ht 64.0 in | Wt 172.0 lb

## 2016-10-19 DIAGNOSIS — E1165 Type 2 diabetes mellitus with hyperglycemia: Secondary | ICD-10-CM

## 2016-10-19 DIAGNOSIS — Z794 Long term (current) use of insulin: Secondary | ICD-10-CM | POA: Diagnosis not present

## 2016-10-19 NOTE — Progress Notes (Signed)
Patient ID: Shelley Escobar, female   DOB: July 25, 1970, 46 y.o.   MRN: [id number]            Reason for Appointment: f/u for Type 2 Diabetes  Referring physician: Daphane Shepherd   History of Present Illness:          Date of diagnosis of type 2 diabetes mellitus:2000        Background history:   At the time of diagnosis she was having symptoms of dizziness and was initially given metformin; later she was also given Janumet. Her level of control has been consistently inadequate with A1c over 7% since at least 2013 Blood sugars however has been poorly controlled in 2013 with A1c consistently over 8%  Recent history:   Her baseline A1c was 9.9 in December Her fructosamine is now 250  INSULIN regimen is: 40 U  Lantus in am    Non-insulin hypoglycemic drugs the patient is taking are: Metformin 1 g twice a day Trulicity 1.5 mg weekly  Current management, blood sugar patterns and problems identified:  She is continuing to use the freestyle [place]  However she is not eating her blood sugars after supper  Overall her blood sugars are excellent even with reducing her Lantus by 10 units  She had to switch to Trulicity because of the cost of Ozempic  Blood sugars are generally fairly good although may be low normal before breakfast or supper last week but not recently  Fasting glucose in the lab was 98  Mostly has relatively high readings after lunch which is not consistent  She thinks that she does not adequately have protein with every meal.  She is sometimes eating fish but not otherwise may just eat vegetables        Side effects from medications have been: None  Compliance with the medical regimen: Fair Hypoglycemia:   never  Glucose monitoring:  using FreeStyle Libre  Mean values apply above for all meters except median for One Touch  PRE-MEAL Fasting Lunch Dinner Bedtime Overall  Glucose range:       Mean/median: 116  121  129  155  129    POST-MEAL PC Breakfast PC  Lunch PC Dinner  Glucose range:     Mean/median:  151     AVERAGE blood sugar on a daily basis ranges from 91 up to 153  She has inadequate data for monitoring between about 6 PM-10:30 PM   Self-care: The diet that the patient has been following is: tries to limit fried food and drinks with sugar .     Meal times are:  Breakfast is at Lunch: 12 noon Dinner: 9 pm   Typical meal intake: Breakfast is none              Dietician visit, most recent: Never               Exercise: Walking 15-30 minutes in the evenings at least 4-5 days a week   Weight history:  Wt Readings from Last 3 Encounters:  10/19/16 172 lb (78 kg)  09/07/16 169 lb (76.7 kg)  08/05/16 172 lb 9.6 oz (78.3 kg)    Glycemic control:   Lab Results  Component Value Date   HGBA1C 9.9 (H) 06/07/2016   HGBA1C 8.7 (H) 03/08/2016   HGBA1C 8.3 (H) 08/25/2015   Lab Results  Component Value Date   MICROALBUR 1.1 03/08/2016   LDLCALC 37 06/07/2016   CREATININE 0.62 10/17/2016   No results  found for: MICRALBCREAT   Other problems: See review of systems   Allergies as of 10/19/2016   No Known Allergies     Medication List       Accurate as of 10/19/16 10:47 AM. Always use your most recent med list.          benzonatate 100 MG capsule Commonly known as:  TESSALON Take 1-2 capsules (100-200 mg total) by mouth 3 (three) times daily as needed for cough.   Dulaglutide 1.5 MG/0.5ML Sopn Commonly known as:  TRULICITY Inject 1.5 mg into the skin once a week.   FREESTYLE LIBRE SENSOR SYSTEM Misc 1 patch by Does not apply route continuous. [id number]   gemfibrozil 600 MG tablet Commonly known as:  LOPID Take 1 tablet (600 mg total) by mouth 2 (two) times daily before a meal.   HYDROcodone-acetaminophen 5-325 MG tablet Commonly known as:  NORCO Take 1-2 tablets by mouth every 6 (six) hours as needed.   Insulin Glargine 100 UNIT/ML Solostar Pen Commonly known as:  LANTUS SOLOSTAR Inject 50 Units into  the skin daily.   Insulin Pen Needle 31G X 5 MM Misc Commonly known as:  B-D UF III MINI PEN NEEDLES USE DAILY AS DIRECTED WITH INSULIN   lisinopril 2.5 MG tablet Commonly known as:  ZESTRIL Take 1 tablet (2.5 mg total) by mouth daily.   metFORMIN 1000 MG tablet Commonly known as:  GLUCOPHAGE TAKE 1 TABLET(1000 MG) BY MOUTH TWICE DAILY WITH A MEAL   oseltamivir 75 MG capsule Commonly known as:  TAMIFLU Take 1 capsule (75 mg total) by mouth 2 (two) times daily.       Allergies: No Known Allergies  Past Medical History:  Diagnosis Date  . Acute appendicitis 01/13/2016  . Anemia    hx  . DM (diabetes mellitus) ([place])   . Hyperlipidemia   . SVD (spontaneous vaginal delivery)    x 3  . Vitamin D deficiency     Past Surgical History:  Procedure Laterality Date  . HYSTEROSCOPY W/D&C N/A 08/07/2013   Procedure: DILATATION AND CURETTAGE /HYSTEROSCOPY;  Surgeon: Azalia Bilis, MD;  Location: [facility];  Service: Gynecology;  Laterality: N/A;  . INTRAUTERINE DEVICE (IUD) INSERTION  09/13/2013  . LAPAROSCOPIC APPENDECTOMY N/A 01/13/2016   Procedure: APPENDECTOMY LAPAROSCOPIC;  Surgeon: Armandina Gemma, MD;  Location: [facility];  Service: General;  Laterality: N/A;  . TUBAL LIGATION     Age 22    Family History  Problem Relation Age of Onset  . Adopted: Yes  . Family history unknown: Yes    Social History:  reports that she has never smoked. She has never used smokeless tobacco. She reports that she does not drink alcohol or use drugs.   Review of Systems   Lipid history: Is on a long-term treatment regimen of gemfibrozil   Normal LDL Present   Lab Results  Component Value Date   CHOL 86 (L) 06/07/2016   HDL 28 (L) 06/07/2016   LDLCALC 37 06/07/2016   TRIG 103 06/07/2016   CHOLHDL 3.1 06/07/2016           She has been given lisinopril by her PCP for kidney protection  Previously had abnormal liver functions and these are back to normal now  Most recent foot exam:  1/18      LABS:  Lab on 10/17/2016  Component Date Value Ref Range Status  . Fructosamine 10/17/2016 250  0 - 285 umol/L Final   Comment: Published reference interval  for apparently healthy subjects between age 66 and 16 is 44 - 19 umol/L and in a poorly controlled diabetic population is 228 - 563 umol/L with a mean of 396 umol/L.   Marland Kitchen Sodium 10/17/2016 136  135 - 145 mEq/L Final  . Potassium 10/17/2016 4.5  3.5 - 5.1 mEq/L Final  . Chloride 10/17/2016 104  96 - 112 mEq/L Final  . CO2 10/17/2016 28  19 - 32 mEq/L Final  . Glucose, Bld 10/17/2016 98  70 - 99 mg/dL Final  . BUN 10/17/2016 21  6 - 23 mg/dL Final  . Creatinine, Ser 10/17/2016 0.62  0.40 - 1.20 mg/dL Final  . Total Bilirubin 10/17/2016 0.3  0.2 - 1.2 mg/dL Final  . Alkaline Phosphatase 10/17/2016 54  39 - 117 U/L Final  . AST 10/17/2016 34  0 - 37 U/L Final  . ALT 10/17/2016 29  0 - 35 U/L Final  . Total Protein 10/17/2016 8.1  6.0 - 8.3 g/dL Final  . Albumin 10/17/2016 4.2  3.5 - 5.2 g/dL Final  . Calcium 10/17/2016 9.8  8.4 - 10.5 mg/dL Final  . GFR 10/17/2016 109.10  >60.00 mL/min Final    Physical Examination:  BP 110/86   Pulse 86   Ht 5\' 4"  (1.626 m)   Wt 172 lb (78 kg)   SpO2 97%   BMI 29.52 kg/m    ASSESSMENT:  Diabetes type 2, uncontrolled See history of present illness for detailed discussion of current diabetes management, blood sugar patterns and problems identified  Patient has had Overall excellent control with using a GLP-1 drug, now Trulicity because of cost Her Lantus has been reduced by 10 units She feels better subjectively and her fructosamine is excellent at 250  She has a baseline A1c of 9.9%   PLAN:   She will use a sample of Ozempic 0.5 mg weekly, otherwise can continue to use Trulicity and also can check on the cost of Bydureon She will need to add consistent amount of protein at lunch, she can have fish or tofu Regular exercise More readings after dinner/ bedtime     Patient Instructions  Check sugar at bedtime more  Check cost of [place] 10/19/2016, 10:47 AM   Note: This office note was prepared with Estate agent. Any transcriptional errors that result from this process are unintentional.

## 2016-10-19 NOTE — Patient Instructions (Addendum)
Check sugar at bedtime more  Check cost of Bydureon

## 2016-11-17 ENCOUNTER — Telehealth: Payer: Self-pay | Admitting: Endocrinology

## 2016-11-18 NOTE — Telephone Encounter (Signed)
Medication has been filled 

## 2016-11-18 NOTE — Telephone Encounter (Signed)
Please advise if okay to refill. Looks like refilled from a different provider.

## 2016-11-18 NOTE — Telephone Encounter (Signed)
Okay to refill? 

## 2016-11-22 ENCOUNTER — Telehealth: Payer: Self-pay

## 2016-11-22 NOTE — Telephone Encounter (Signed)
Called and spoke to patient's daughter and she said this patient has had several high blood sugar readings in the past few days and patient is not feeling well. I asked if she could get the blood sugar readings and times so we can see how they are fluctuating and she will try. Please advise.

## 2016-11-22 NOTE — Telephone Encounter (Signed)
Please find out if she is taking Ozempic 0.5 mg weekly.  Also, how much Lantus is she taking? Also is the 10 AM blood sugar before breakfast?

## 2016-11-22 NOTE — Telephone Encounter (Signed)
Called patient and left a voice message to call us back. I do know that she was taking 40 units of the Lantus daily.

## 2016-11-22 NOTE — Telephone Encounter (Signed)
Friday 222 10am Sat 250 7pm Sun 270 7pm Mon 303 1:45pm Today 10am 339  BS reading, last 5 days.  Thank you,  -LL

## 2016-11-23 NOTE — Telephone Encounter (Signed)
Patient's daughter Fernande Boyden, is calling Megan back. She is not listed on DPR though and mom does not speak [place].  Contact number is listed.  Thank you,  -LL

## 2016-11-23 NOTE — Telephone Encounter (Signed)
She can go up on her Lantus to 46 units but also need to know if her sugars are high about 2 hours after lunch or dinner

## 2016-11-23 NOTE — Telephone Encounter (Signed)
Called Shelley Escobar back and she said patients blood sugar this morning at 10am was 202. She stated that patient is taking 40 units of Lantus daily and also taking the 0.5 mg of Ozempic weekly. Please advise.

## 2016-11-28 NOTE — Telephone Encounter (Signed)
Patients son states his mothers blood sugar is usually in the mid 200's in the morning and an hour after her breakfast it goes over 300 he also states this happens at lunch and dinner- son states he will have the patient go up to 46 units of Lantus and he will check her blood sugar 2 hours after lunch and dinner for a couple of days and call me back with those readings

## 2016-11-28 NOTE — Telephone Encounter (Signed)
Since her sugars are higher after meals she will need to start NovoLog insulin which is fast acting right before eating her meals, starting with 5 units right before each meal.  She will still need to continue 46 units of Lantus.  Need to schedule her follow-up later this month if possible instead of July.  Also continue Ozempic Please send prescription for NovoLog Flex pen

## 2016-11-29 ENCOUNTER — Other Ambulatory Visit: Payer: Self-pay

## 2016-11-29 MED ORDER — INSULIN ASPART 100 UNIT/ML FLEXPEN: PEN_INJECTOR | SUBCUTANEOUS | 3 refills | Status: DC

## 2016-11-29 NOTE — Telephone Encounter (Signed)
Left a vm requesting a call back to discuss dosage changes

## 2016-11-29 NOTE — Telephone Encounter (Signed)
Patient's son returning phone call. Transferred call to [place].

## 2016-11-29 NOTE — Telephone Encounter (Signed)
Spoke with the patients son and gave him instructions for Novolog and requested he reschedule his mothers appointment to an earlier date- ordered the Novolog

## 2016-12-19 ENCOUNTER — Other Ambulatory Visit: Payer: No Typology Code available for payment source

## 2016-12-20 ENCOUNTER — Other Ambulatory Visit: Payer: No Typology Code available for payment source

## 2016-12-20 ENCOUNTER — Other Ambulatory Visit: Payer: Self-pay | Admitting: Endocrinology

## 2016-12-20 ENCOUNTER — Other Ambulatory Visit (INDEPENDENT_AMBULATORY_CARE_PROVIDER_SITE_OTHER): Payer: No Typology Code available for payment source

## 2016-12-20 DIAGNOSIS — IMO0002 Reserved for concepts with insufficient information to code with codable children: Secondary | ICD-10-CM

## 2016-12-20 DIAGNOSIS — E1169 Type 2 diabetes mellitus with other specified complication: Principal | ICD-10-CM

## 2016-12-20 DIAGNOSIS — E1165 Type 2 diabetes mellitus with hyperglycemia: Secondary | ICD-10-CM

## 2016-12-20 DIAGNOSIS — Z794 Long term (current) use of insulin: Secondary | ICD-10-CM

## 2016-12-20 DIAGNOSIS — IMO0001 Reserved for inherently not codable concepts without codable children: Secondary | ICD-10-CM

## 2016-12-20 LAB — GLUCOSE, RANDOM: Glucose, Bld: 143 mg/dL — ABNORMAL HIGH (ref 70–99)

## 2016-12-21 LAB — HEMOGLOBIN A1C
Hgb A1c MFr Bld: 8.3 % — ABNORMAL HIGH (ref <5.7)
MEAN PLASMA GLUCOSE: 192 mg/dL

## 2016-12-21 NOTE — Progress Notes (Signed)
Patient ID: Shelley Escobar, female   DOB: [birth year], 46 y.o.   MRN: [id number]            Reason for Appointment: f/u for Type 2 Diabetes  Referring physician: Daphane Shepherd   History of Present Illness:          Date of diagnosis of type 2 diabetes mellitus:2000        Background history:   At the time of diagnosis she was having symptoms of dizziness and was initially given metformin; later she was also given Janumet. Her level of control has been consistently inadequate with A1c over 7% since at least 2013 Blood sugars however has been poorly controlled in 2013 with A1c consistently over 8%  Recent history:   Her baseline A1c was 9.9 in December and is now 8.3  INSULIN regimen is: 46 U  Lantus in am    Non-insulin hypoglycemic drugs the patient is taking are: Metformin 1 g twice a day Trulicity 1.5 mg weekly  Current management, blood sugar patterns and problems identified:  She is having much higher blood sugars compared to her last visit when she was averaging only 129  HIGHEST blood sugars are after evening meal and averaging 252 after 10 PM  However she is having significant variability in her blood sugars overall with the daily average ranging from 140 the couple of weeks ago and highest average 249 the previous Saturday   As before she has variability in her diet with inconsistent amount of carbohydrate or protein with meals  Again mostly eating 2 meals at lunch and dinner, evening meal usually late  Her weight is slightly better at this time       Side effects from medications have been: None  Compliance with the medical regimen: Fair Hypoglycemia:   never  Glucose monitoring:  using FreeStyle Libre  Mean values apply above for all meters except median for One Touch  PRE-MEAL Fasting Lunch Dinner Bedtime Overall  Glucose range:       Mean/median: 151  182 252 195    POST-MEAL PC Breakfast PC Lunch PC Dinner  Glucose range:     Mean/median: 154 225       Self-care: The diet that the patient has been following is: tries to limit fried food and drinks with sugar .     Meal times are:  Lunch: 12 noon Dinner: 9 pm   Typical meal intake: Breakfast is none  usually              Dietician visit, most recent: Never               Exercise: Walking 15-30 minutes in the evenings at least 4-5 days a week   Weight history:  Wt Readings from Last 3 Encounters:  12/22/16 169 lb (76.7 kg)  10/19/16 172 lb (78 kg)  09/07/16 169 lb (76.7 kg)    Glycemic control:   Lab Results  Component Value Date   HGBA1C 8.3 (H) 12/20/2016   HGBA1C 9.9 (H) 06/07/2016   HGBA1C 8.7 (H) 03/08/2016   Lab Results  Component Value Date   MICROALBUR 1.1 03/08/2016   LDLCALC 37 06/07/2016   CREATININE 0.62 10/17/2016   No results found for: MICRALBCREAT   Other problems: See review of systems   Allergies as of 12/22/2016   No Known Allergies     Medication List       Accurate as of 12/22/16 12:43 PM. Always use your most  recent med list.          Dulaglutide 1.5 MG/0.5ML Sopn Commonly known as:  TRULICITY Inject 1.5 mg into the skin once a week.   FREESTYLE LIBRE SENSOR SYSTEM Misc 1 patch by Does not apply route continuous. [id number]   FREESTYLE LIBRE SENSOR SYSTEM Misc USE AS DIRECTED   gemfibrozil 600 MG tablet Commonly known as:  LOPID Take 1 tablet (600 mg total) by mouth 2 (two) times daily before a meal.   insulin aspart 100 UNIT/ML FlexPen Commonly known as:  NOVOLOG FLEXPEN Inject 5 units into the skin before each meal   Insulin Glargine 100 UNIT/ML Solostar Pen Commonly known as:  LANTUS SOLOSTAR Inject 50 Units into the skin daily.   Insulin Pen Needle 31G X 5 MM Misc Commonly known as:  B-D UF III MINI PEN NEEDLES USE DAILY AS DIRECTED WITH INSULIN   lisinopril 2.5 MG tablet Commonly known as:  ZESTRIL Take 1 tablet (2.5 mg total) by mouth daily.   metFORMIN 1000 MG tablet Commonly known as:   GLUCOPHAGE TAKE 1 TABLET(1000 MG) BY MOUTH TWICE DAILY WITH A MEAL       Allergies: No Known Allergies  Past Medical History:  Diagnosis Date  . Acute appendicitis 01/13/2016  . Anemia    hx  . DM (diabetes mellitus) ([place])   . Hyperlipidemia   . SVD (spontaneous vaginal delivery)    x 3  . Vitamin D deficiency     Past Surgical History:  Procedure Laterality Date  . HYSTEROSCOPY W/D&C N/A 08/07/2013   Procedure: DILATATION AND CURETTAGE /HYSTEROSCOPY;  Surgeon: Azalia Bilis, MD;  Location: [facility];  Service: Gynecology;  Laterality: N/A;  . INTRAUTERINE DEVICE (IUD) INSERTION  09/13/2013  . LAPAROSCOPIC APPENDECTOMY N/A 01/13/2016   Procedure: APPENDECTOMY LAPAROSCOPIC;  Surgeon: Armandina Gemma, MD;  Location: [facility];  Service: General;  Laterality: N/A;  . TUBAL LIGATION     Age 86    Family History  Problem Relation Age of Onset  . Adopted: Yes  . Family history unknown: Yes    Social History:  reports that she has never smoked. She has never used smokeless tobacco. She reports that she does not drink alcohol or use drugs.   Review of Systems   Lipid history: Is on a long-term treatment regimen of gemfibrozil   Normal LDL Previously   Lab Results  Component Value Date   CHOL 86 (L) 06/07/2016   HDL 28 (L) 06/07/2016   LDLCALC 37 06/07/2016   TRIG 103 06/07/2016   CHOLHDL 3.1 06/07/2016           She has been given lisinopril 2.5 mg daily by her PCP for kidney protection  Previously had abnormal liver functions and these are back to normal now  Most recent foot exam: 1/18      LABS:  Lab on 12/20/2016  Component Date Value Ref Range Status  . Hgb A1c MFr Bld 12/20/2016 8.3* <5.7 % Final   Comment:   For someone without known diabetes, a hemoglobin A1c value of 6.5% or greater indicates that they may have diabetes and this should be confirmed with a follow-up test.   For someone with known diabetes, a value <7% indicates that their diabetes is  well controlled and a value greater than or equal to 7% indicates suboptimal control. A1c targets should be individualized based on duration of diabetes, age, comorbid conditions, and other considerations.   Currently, no consensus exists for use of  hemoglobin A1c for diagnosis of diabetes for children.     . Mean Plasma Glucose 12/20/2016 192  mg/dL Final  Lab on 12/20/2016  Component Date Value Ref Range Status  . Glucose, Bld 12/20/2016 143* 70 - 99 mg/dL Final    Physical Examination:  BP 110/76   Pulse 84   Ht 5\' 4"  (1.626 m)   Wt 169 lb (76.7 kg)   SpO2 99%   BMI 29.01 kg/m    ASSESSMENT:  Diabetes type 2, uncontrolled See history of present illness for detailed discussion of current diabetes management, blood sugar patterns and problems identified  Although previously she had responded well to adding Trulicity to her basal insulin she is now having progressively higher readings even with continuing her Trulicity Has mostly postprandial hyperglycemia especially after evening meal Blood sugars do not always come down to normal before breakfast She is already on metformin also Discussed with her and her son that she is having insulin deficiency and needs mealtime insulin since basal insulin and Trulicity are not effective currently without any other factors causing hyperglycemia A1c is still over 8%  Discussed in detail various options for starting a basal bolus insulin regimen including mealtime insulin with at least 2 doses based on the number of meals she is eating Also she can try using the V-go pump and this was shown to her and explained in detail how this works Considering that she is having no coverage for insulin and she does not want to use a syringe and insulin bottle for injections she will need to consider using the V-go pump so that she can use one type of insulin that she can probably get from [employer]   PLAN:     The patient agrees to a trial of the  V-go pump Her son was also check with the company about her out-of-pocket expense and any assistance they can offer Continue checking blood sugar with a freestyle Libre sensor including readings at night after supper She will be started on the 40 unit basal and will need 6-8 units to cover her meals, probably more at suppertime She can stop Trulicity when finished Continue metformin Regular walking for exercise  There are no Patient Instructions on file for this visit.   Counseling time on subjects discussed in assessment and plan sections is over 50% of today's 25 minute visit     Shelley Escobar 12/22/2016, 12:43 PM   Note: This office note was prepared with Estate agent. Any transcriptional errors that result from this process are unintentional.

## 2016-12-22 ENCOUNTER — Ambulatory Visit (INDEPENDENT_AMBULATORY_CARE_PROVIDER_SITE_OTHER): Payer: No Typology Code available for payment source | Admitting: Endocrinology

## 2016-12-22 ENCOUNTER — Encounter: Payer: Self-pay | Admitting: Endocrinology

## 2016-12-22 VITALS — BP 110/76 | HR 84 | Ht 64.0 in | Wt 169.0 lb

## 2016-12-22 DIAGNOSIS — Z794 Long term (current) use of insulin: Secondary | ICD-10-CM | POA: Diagnosis not present

## 2016-12-22 DIAGNOSIS — E1165 Type 2 diabetes mellitus with hyperglycemia: Secondary | ICD-10-CM

## 2016-12-23 ENCOUNTER — Other Ambulatory Visit: Payer: Self-pay | Admitting: Physician Assistant

## 2016-12-23 DIAGNOSIS — E785 Hyperlipidemia, unspecified: Secondary | ICD-10-CM

## 2017-01-04 ENCOUNTER — Encounter: Payer: No Typology Code available for payment source | Attending: Endocrinology | Admitting: Nutrition

## 2017-01-04 ENCOUNTER — Encounter: Payer: No Typology Code available for payment source | Admitting: Nutrition

## 2017-01-04 DIAGNOSIS — E1165 Type 2 diabetes mellitus with hyperglycemia: Secondary | ICD-10-CM | POA: Diagnosis not present

## 2017-01-04 DIAGNOSIS — Z794 Long term (current) use of insulin: Secondary | ICD-10-CM | POA: Insufficient documentation

## 2017-01-04 DIAGNOSIS — IMO0001 Reserved for inherently not codable concepts without codable children: Secondary | ICD-10-CM

## 2017-01-04 NOTE — Progress Notes (Signed)
Pt. Is here to start the V-Go.  She does not speak [place], but her son is with her to interpret. They did not want an interpreter. Both people were trained on how to fill, apply and use the V-go. They were given a v-go 40 starter kit with 6 v-go 40s, directions for use, and a help telephone number to call if questions. The son filled the V-Go with Novolog insulin, but did not put it on her, because she took her Lantus this morning. Told them to start this tomorrow, and to remember that she will not need the Lantus and the pens any more. Written instructions were given to stop the Lantus today, and they reported good understanding of this Patient redemonstrated how to give the meal time insulin by pressing both the insulin ready button, and the insulin delivery button.  They were told to give 3-4 button presses before meals, and there reported good understanding of this.  Written instructions were also given for this. Son said he would fill the v-gos and she would attach them.  He was told that he can fill 5 at one time,  But to keep them in the fridge, and not to freeze them.  He reported good understanding of this. He was given a log sheet and told to record her blood sugars before meals and at bedtime and to log on there how many button presses she gave before each meal.   He was instructed to bring this sheet back with them when they return in one week.  The agreed to do this, and how no final questions.

## 2017-01-04 NOTE — Patient Instructions (Signed)
Fill and apply a new V-Go every 24 hours Give 3-4 button presses before meals Give 1 button press if snacking at night after supper.

## 2017-01-18 ENCOUNTER — Ambulatory Visit: Payer: No Typology Code available for payment source | Admitting: Endocrinology

## 2017-01-18 DIAGNOSIS — Z0289 Encounter for other administrative examinations: Secondary | ICD-10-CM

## 2017-02-17 ENCOUNTER — Telehealth: Payer: Self-pay | Admitting: Endocrinology

## 2017-02-17 NOTE — Telephone Encounter (Signed)
-----   Message from Elayne Snare, MD sent at 01/23/2017  9:09 AM EDT ----- Regarding: V-go follow-up She was started on the V-go pump last month but did not show up for her follow-up.  Need to schedule this as soon as possible

## 2017-02-17 NOTE — Telephone Encounter (Signed)
LM for pt to call back to reschedule the appt for the vgo fu she no showed

## 2017-02-19 ENCOUNTER — Other Ambulatory Visit: Payer: Self-pay | Admitting: Endocrinology

## 2017-02-19 DIAGNOSIS — IMO0001 Reserved for inherently not codable concepts without codable children: Secondary | ICD-10-CM

## 2017-02-19 DIAGNOSIS — E1165 Type 2 diabetes mellitus with hyperglycemia: Principal | ICD-10-CM

## 2017-02-19 DIAGNOSIS — Z794 Long term (current) use of insulin: Principal | ICD-10-CM

## 2017-03-05 IMAGING — MG 2D DIGITAL SCREENING BILATERAL MAMMOGRAM WITH CAD AND ADJUNCT TO
8 of 14 series · 8 of 30 positions shown · non-contrast
Comparison: Previous exam(s).

CLINICAL DATA: Screening.

EXAM:
2D DIGITAL SCREENING BILATERAL MAMMOGRAM WITH CAD AND ADJUNCT TOMO

[R MLO (1 of 2)]
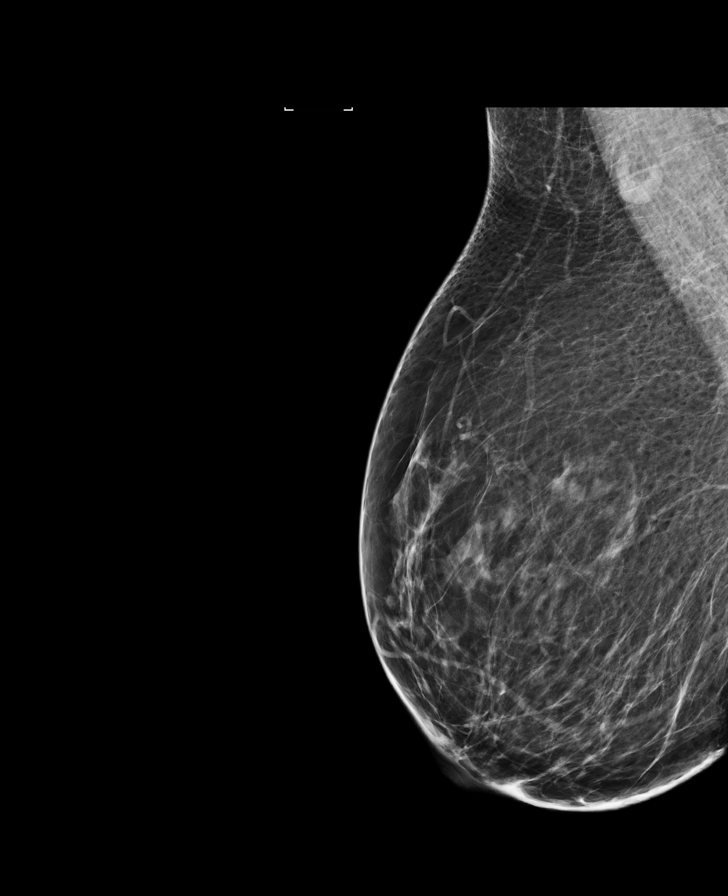

[L MLO (1 of 2)]
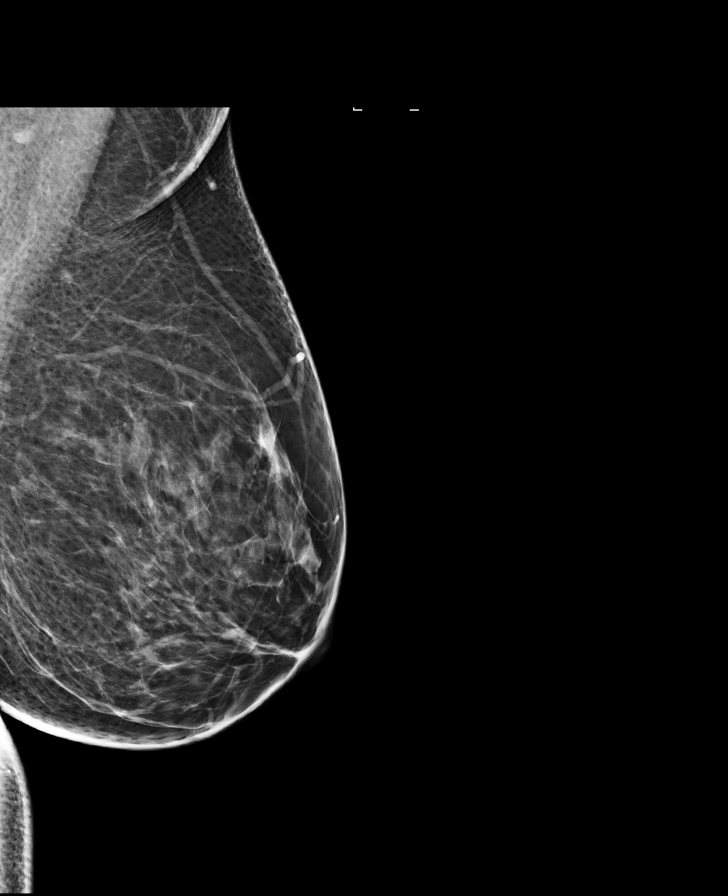

[R MLO (2 of 2)]
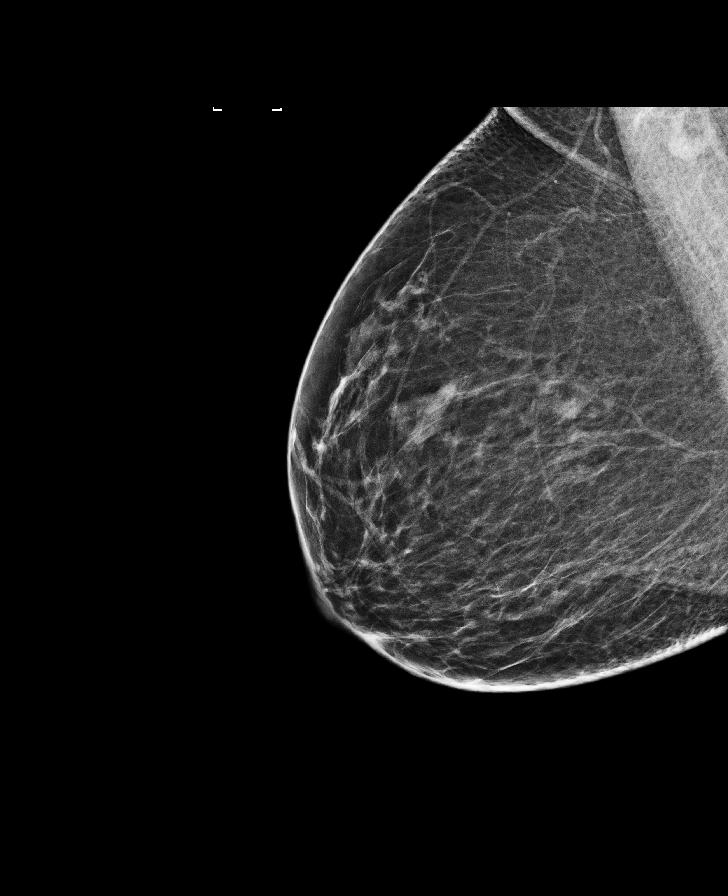

[L CC]
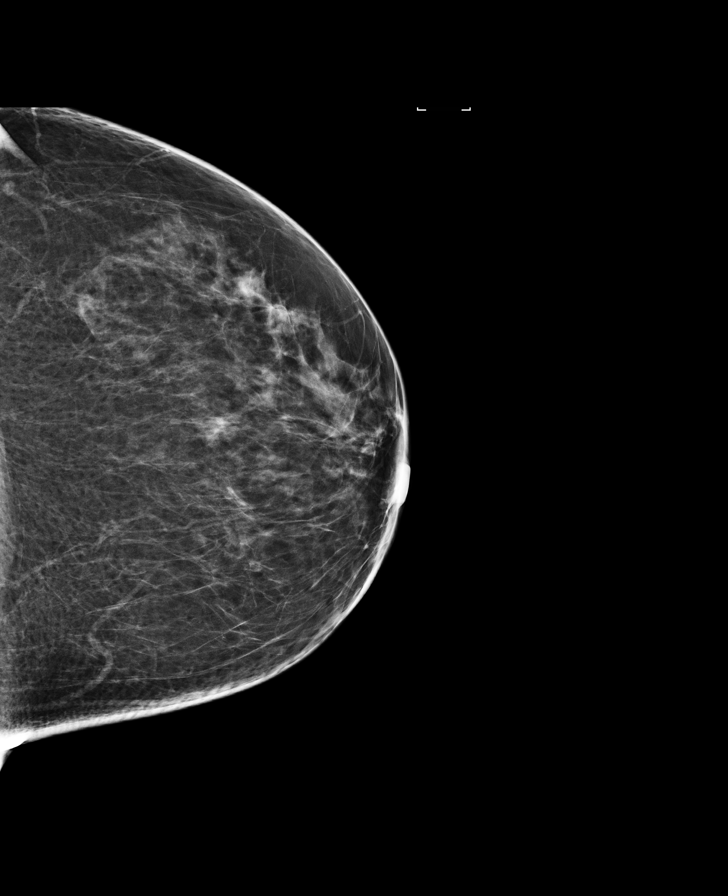

[L MLO synth-2D]
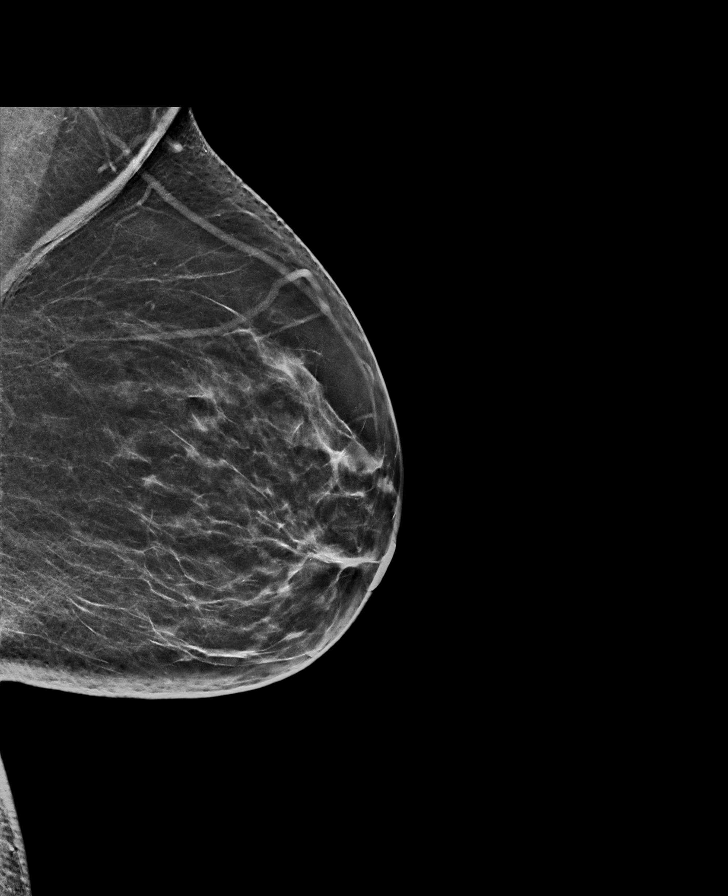

[R CC synth-2D]
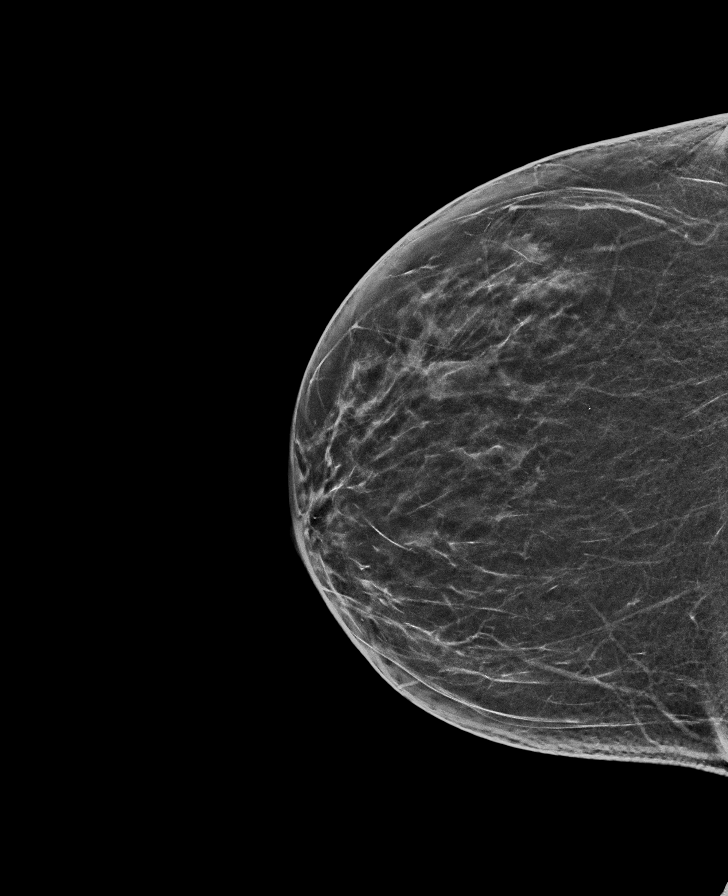

[L MLO (2 of 2)]
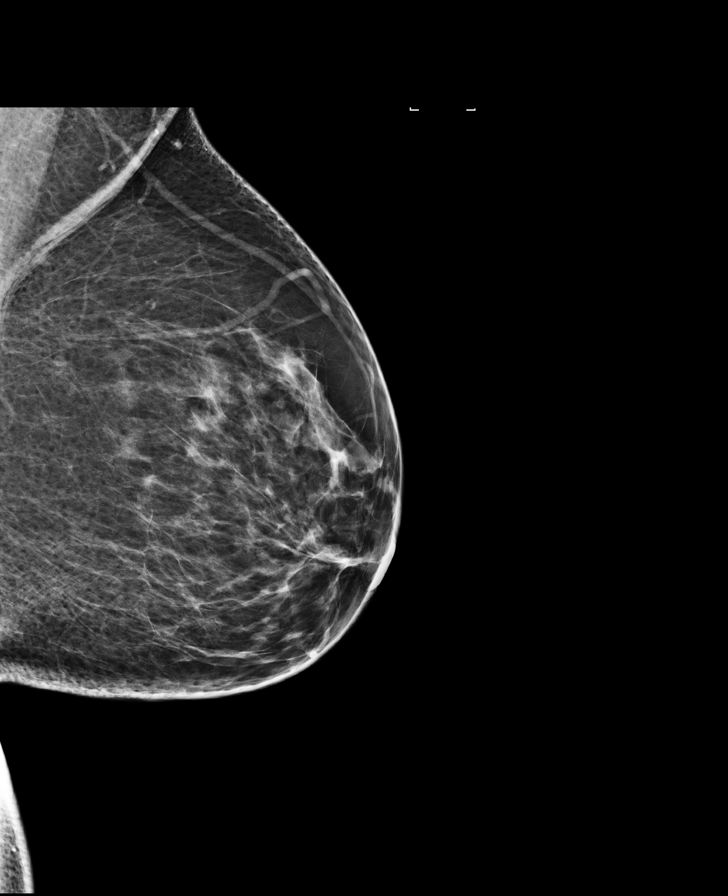

[R MLO synth-2D]
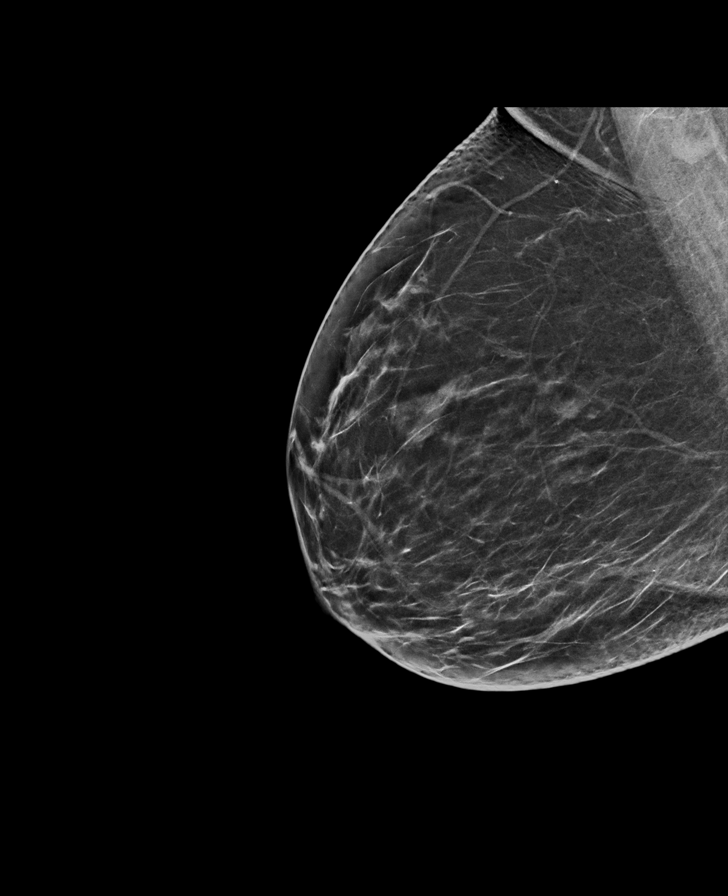

[8 of 30 positions shown; findings below may reference images not displayed]

ACR Breast Density Category b: There are scattered areas of
fibroglandular density.
FINDINGS: There are no findings suspicious for malignancy. Images were
processed with CAD.
IMPRESSION: No mammographic evidence of malignancy. A result letter of this
screening mammogram will be mailed directly to the patient.

RECOMMENDATION:
Screening mammogram in one year. (Code:97-6-RS4)

BI-RADS CATEGORY  1: Negative.

## 2017-03-19 ENCOUNTER — Other Ambulatory Visit: Payer: Self-pay | Admitting: Endocrinology

## 2017-03-19 DIAGNOSIS — IMO0001 Reserved for inherently not codable concepts without codable children: Secondary | ICD-10-CM

## 2017-03-19 DIAGNOSIS — Z794 Long term (current) use of insulin: Principal | ICD-10-CM

## 2017-03-19 DIAGNOSIS — E1165 Type 2 diabetes mellitus with hyperglycemia: Principal | ICD-10-CM

## 2017-03-20 ENCOUNTER — Other Ambulatory Visit: Payer: Self-pay | Admitting: Endocrinology

## 2017-03-21 ENCOUNTER — Other Ambulatory Visit: Payer: Self-pay | Admitting: Physician Assistant

## 2017-03-21 DIAGNOSIS — E785 Hyperlipidemia, unspecified: Secondary | ICD-10-CM

## 2017-03-22 NOTE — Telephone Encounter (Signed)
Mychart message sent to pt about making an apt °

## 2017-04-23 ENCOUNTER — Other Ambulatory Visit: Payer: Self-pay | Admitting: Physician Assistant

## 2017-04-23 DIAGNOSIS — Z794 Long term (current) use of insulin: Principal | ICD-10-CM

## 2017-04-23 DIAGNOSIS — E785 Hyperlipidemia, unspecified: Secondary | ICD-10-CM

## 2017-04-23 DIAGNOSIS — IMO0001 Reserved for inherently not codable concepts without codable children: Secondary | ICD-10-CM

## 2017-04-23 DIAGNOSIS — E1165 Type 2 diabetes mellitus with hyperglycemia: Principal | ICD-10-CM

## 2017-05-08 ENCOUNTER — Other Ambulatory Visit: Payer: Self-pay | Admitting: Physician Assistant

## 2017-05-08 DIAGNOSIS — Z794 Long term (current) use of insulin: Principal | ICD-10-CM

## 2017-05-08 DIAGNOSIS — E1165 Type 2 diabetes mellitus with hyperglycemia: Principal | ICD-10-CM

## 2017-05-08 DIAGNOSIS — IMO0001 Reserved for inherently not codable concepts without codable children: Secondary | ICD-10-CM

## 2017-05-08 NOTE — Telephone Encounter (Signed)
Please review this presciption.

## 2017-05-08 NOTE — Telephone Encounter (Signed)
Called patient and spoke with son regarding making an appointment for patient so she can have her medication refilled (Metformin). Encouraged him for her to make this appointment for medication. He stated she would not have insurance until January.

## 2017-05-18 ENCOUNTER — Other Ambulatory Visit: Payer: Self-pay | Admitting: Physician Assistant

## 2017-05-18 DIAGNOSIS — Z794 Long term (current) use of insulin: Principal | ICD-10-CM

## 2017-05-18 DIAGNOSIS — IMO0001 Reserved for inherently not codable concepts without codable children: Secondary | ICD-10-CM

## 2017-05-18 DIAGNOSIS — E1165 Type 2 diabetes mellitus with hyperglycemia: Principal | ICD-10-CM

## 2017-05-21 ENCOUNTER — Other Ambulatory Visit: Payer: Self-pay | Admitting: Physician Assistant

## 2017-05-21 DIAGNOSIS — Z794 Long term (current) use of insulin: Principal | ICD-10-CM

## 2017-05-21 DIAGNOSIS — IMO0001 Reserved for inherently not codable concepts without codable children: Secondary | ICD-10-CM

## 2017-05-21 DIAGNOSIS — E1165 Type 2 diabetes mellitus with hyperglycemia: Principal | ICD-10-CM

## 2017-05-23 ENCOUNTER — Ambulatory Visit: Payer: No Typology Code available for payment source | Admitting: Physician Assistant

## 2017-05-25 ENCOUNTER — Ambulatory Visit: Payer: No Typology Code available for payment source | Admitting: Family Medicine

## 2017-06-21 ENCOUNTER — Other Ambulatory Visit: Payer: Self-pay

## 2017-06-21 ENCOUNTER — Ambulatory Visit (INDEPENDENT_AMBULATORY_CARE_PROVIDER_SITE_OTHER): Payer: BLUE CROSS/BLUE SHIELD | Admitting: Physician Assistant

## 2017-06-21 ENCOUNTER — Encounter: Payer: Self-pay | Admitting: Physician Assistant

## 2017-06-21 VITALS — BP 100/66 | HR 101 | Temp 98.8°F | Resp 18 | Ht 64.0 in | Wt 168.6 lb

## 2017-06-21 DIAGNOSIS — E785 Hyperlipidemia, unspecified: Secondary | ICD-10-CM | POA: Diagnosis not present

## 2017-06-21 DIAGNOSIS — IMO0001 Reserved for inherently not codable concepts without codable children: Secondary | ICD-10-CM

## 2017-06-21 DIAGNOSIS — K760 Fatty (change of) liver, not elsewhere classified: Secondary | ICD-10-CM | POA: Diagnosis not present

## 2017-06-21 DIAGNOSIS — Z794 Long term (current) use of insulin: Secondary | ICD-10-CM

## 2017-06-21 DIAGNOSIS — Z1231 Encounter for screening mammogram for malignant neoplasm of breast: Secondary | ICD-10-CM | POA: Diagnosis not present

## 2017-06-21 DIAGNOSIS — E1165 Type 2 diabetes mellitus with hyperglycemia: Secondary | ICD-10-CM

## 2017-06-21 DIAGNOSIS — D649 Anemia, unspecified: Secondary | ICD-10-CM | POA: Diagnosis not present

## 2017-06-21 MED ORDER — LISINOPRIL 2.5 MG PO TABS: 2.5000 mg | ORAL_TABLET | Freq: Every day | ORAL | 3 refills | Status: DC

## 2017-06-21 MED ORDER — GEMFIBROZIL 600 MG PO TABS
ORAL_TABLET | ORAL | 3 refills | Status: DC
Start: 2017-06-21 — End: 2017-09-19

## 2017-06-21 NOTE — Assessment & Plan Note (Signed)
Update CBC. If persists, will recommend additional evaluation.

## 2017-06-21 NOTE — Patient Instructions (Addendum)
   We recommend that you schedule a mammogram for breast cancer screening. Typically, you do not need a referral to do this. Please contact a local imaging center to schedule your mammogram.  [facility] - [phone]  *ask for the Radiology Department [facility] [place] ([place]) - [phone] or [phone]  [facility] - [phone] [place] [phone] [facility] - [phone]  *ask for the [facility] - [phone]  *ask for the Radiology Department [facility] - [phone]  *ask for the Mammography Department [place] - [phone]   Please schedule an eye exam: [facility]  [address] [place], [place] [address]  Phone: [phone]  [facility] [place], [place], [place] [address]  Phone: [phone]   IF you received an x-ray today, you will receive an invoice from [facility]. Please contact [facility] at [phone] with questions or concerns regarding your invoice.   IF you received labwork today, you will receive an invoice from [place]. Please contact [facility] at [phone] with questions or concerns regarding your invoice.   Our billing staff will not be able to assist you with questions regarding bills from these companies.  You will be contacted with the lab results as soon as they are available. The fastest way to get your results is to activate your My Chart account. Instructions are located on the last page of this paperwork. If you have not heard from [place] regarding the results in 2 weeks, please contact this office.

## 2017-06-21 NOTE — Assessment & Plan Note (Signed)
Has been well controlled with gemfibrozil. Not on a statin, but LDL <70.

## 2017-06-21 NOTE — Assessment & Plan Note (Signed)
Update labs today. Anticipate need for mealtime insulin. She has insurance again and will likely need re-evaluation with endocrinology.

## 2017-06-21 NOTE — [place] Notes (Signed)
Patient ID: Shelley Escobar, female    DOB: 03/24/1971, 47 y.o.   MRN: [id number]  PCP: Harrison Mons, PA-C  Chief Complaint  Patient presents with  . Diabetes  . Anemia  . Follow-up  . Medication Refill    Lopid 600 MG, Lisinopril 2.5 MG    Subjective:   Presents for evaluation of diabetes and anemia, and for medication refills. Her son is present and translates.  I saw her last in 07/2016.  Last labs were in July, with endocrinology, but she hasn't followed up due loss of insurance and inability to afford regular visits there. Her new insurance became active yesterday. A1C 8.3%, down from 9.9% 05/2016 Last cholesterol 05/2016. TC 86 TG 103 HDL 28 LDL 37  Saw nutritionist in July. They were looking in to the [facility] system, but it was too expensive. Trulicity, previously effective, was no longer helping, and was discontinued. The patient has stopped the use of mealtime insulin, thinking that it was to be temporary. Currently using metformin 1000 mg BID and Lantus insulin once daily.  Glucose monitoring at home: After eating 220. Fasting 110. She has headache and feels bad when readings are >300. Lowest reading was 60, associated with feeling weak. Eats a banana with resolution.  Review of Systems  Constitutional: Negative for activity change, appetite change, fatigue and unexpected weight change.  HENT: Negative for congestion, dental problem, ear pain, hearing loss, mouth sores, postnasal drip, rhinorrhea, sneezing, sore throat, tinnitus and trouble swallowing.   Eyes: Negative for photophobia, pain, redness and visual disturbance.  Respiratory: Negative for cough, chest tightness and shortness of breath.   Cardiovascular: Negative for chest pain, palpitations and leg swelling.  Gastrointestinal: Negative for abdominal pain, blood in stool, constipation, diarrhea, nausea and vomiting.  Endocrine: Negative for cold intolerance, heat intolerance, polydipsia,  polyphagia and polyuria.  Genitourinary: Negative for dysuria, frequency, hematuria and urgency.  Musculoskeletal: Negative for arthralgias, gait problem, myalgias and neck stiffness.  Skin: Negative for rash.  Neurological: Negative for dizziness, speech difficulty, weakness, light-headedness, numbness and headaches.  Hematological: Negative for adenopathy.  Psychiatric/Behavioral: Negative for confusion and sleep disturbance. The patient is not nervous/anxious.        Patient Active Problem List   Diagnosis Date Noted  . Fatty infiltration of liver 03/10/2016  . Anemia 03/10/2016  . Obesity (BMI 30.0-34.9) 01/27/2012  . Diabetes mellitus type 2, uncontrolled, without complications ([place]) 40/98/1191  . Dyslipidemia 07/02/2011     Prior to Admission medications   Medication Sig Start Date End Date Taking? Authorizing Provider  gemfibrozil (LOPID) 600 MG tablet Take 1 tablet (600 mg total) by mouth 2 (two) times daily before a meal. 08/05/16  Yes Gurtej Noyola, PA-C  gemfibrozil (LOPID) 600 MG tablet TAKE 1 TABLET(600 MG) BY MOUTH TWICE DAILY BEFORE A MEAL 04/25/17  Yes Lucciana Head, PA-C  Insulin Pen Needle (B-D UF III MINI PEN NEEDLES) 31G X 5 MM MISC USE DAILY AS DIRECTED WITH INSULIN 08/05/16  Yes Keirstan Iannello, PA-C  LANTUS SOLOSTAR 100 UNIT/ML Solostar Pen INJECT 50 UNITS UNDER SKIN TWICE DAILY 03/19/17  Yes Elayne Snare, MD  lisinopril (PRINIVIL,ZESTRIL) 2.5 MG tablet TAKE 1 TABLET BY MOUTH DAILY 04/25/17  Yes Gratia Disla, PA-C  lisinopril (ZESTRIL) 2.5 MG tablet Take 1 tablet (2.5 mg total) by mouth daily. 08/05/16  Yes Nikiyah Fackler, PA-C  metFORMIN (GLUCOPHAGE) 1000 MG tablet TAKE 1 TABLET(1000 MG) BY MOUTH TWICE DAILY WITH A MEAL 08/05/16  Yes Harrison Mons,  PA-C  metFORMIN (GLUCOPHAGE) 1000 MG tablet TAKE 1 TABLET BY MOUTH TWICE DAILY WITH MEALS 05/22/17  Yes Rambo Sarafian, PA-C  Continuous Blood Gluc Sensor ([place]) MISC 1 patch by Does not  apply route continuous. [id number] Patient not taking: Reported on 06/21/2017 08/05/16   Harrison Mons, PA-C  Continuous Blood Gluc Sensor ([place]) MISC USE AS DIRECTED Patient not taking: Reported on 06/21/2017 03/20/17   Elayne Snare, MD  Dulaglutide (TRULICITY) 1.5 XV/4.0GQ SOPN Inject 1.5 mg into the skin once a week. Patient not taking: Reported on 06/21/2017 09/09/16   Elayne Snare, MD  insulin aspart (NOVOLOG FLEXPEN) 100 UNIT/ML FlexPen Inject 5 units into the skin before each meal Patient not taking: Reported on 12/22/2016 11/29/16   Elayne Snare, MD     No Known Allergies     Objective:  Physical Exam  Constitutional: She is oriented to person, place, and time. She appears well-developed and well-nourished. She is active and cooperative. No distress.  BP 100/66 (BP Location: Left Arm, Patient Position: Sitting, Cuff Size: Normal)   Pulse (!) 101   Temp 98.8 F (37.1 C) (Oral)   Resp 18   Ht 5\' 4"  (1.626 m)   Wt 168 lb 9.6 oz (76.5 kg)   SpO2 99%   BMI 28.94 kg/m   HENT:  Head: Normocephalic and atraumatic.  Right Ear: Hearing normal.  Left Ear: Hearing normal.  Eyes: Conjunctivae are normal. No scleral icterus.  Neck: Normal range of motion. Neck supple. No thyromegaly present.  Cardiovascular: Normal rate, regular rhythm and normal heart sounds.  Pulses:      Radial pulses are 2+ on the right side, and 2+ on the left side.  Pulmonary/Chest: Effort normal and breath sounds normal.  Lymphadenopathy:       Head (right side): No tonsillar, no preauricular, no posterior auricular and no occipital adenopathy present.       Head (left side): No tonsillar, no preauricular, no posterior auricular and no occipital adenopathy present.    She has no cervical adenopathy.       Right: No supraclavicular adenopathy present.       Left: No supraclavicular adenopathy present.  Neurological: [name], place, and time. No sensory  deficit.  Skin: Skin is warm, dry and intact. No rash noted. No cyanosis or erythema. Nails show no clubbing.  Psychiatric: She has a normal mood and affect. Her speech is normal and behavior is normal.           Assessment & Plan:   Problem List Items Addressed This Visit    Diabetes mellitus type 2, uncontrolled, without complications ([place]) - Primary    Update labs today. Anticipate need for mealtime insulin. She has insurance again and will likely need re-evaluation with endocrinology.      Relevant Medications   lisinopril (PRINIVIL,ZESTRIL) 2.5 MG tablet   Other Relevant Orders   Comprehensive metabolic panel   Hemoglobin A1c   Microalbumin / creatinine urine ratio   HM DIABETES EYE EXAM (Completed)   Dyslipidemia    Has been well controlled with gemfibrozil. Not on a statin, but LDL <70.      Relevant Medications   gemfibrozil (LOPID) 600 MG tablet   Other Relevant Orders   Comprehensive metabolic panel   Lipid panel   Fatty infiltration of liver   Relevant Orders   Comprehensive metabolic panel   Lipid panel   Anemia    Update  CBC. If persists, will recommend additional evaluation.      Relevant Orders   CBC with Differential/Platelet    Other Visit Diagnoses    Encounter for screening mammogram for breast cancer       Relevant Orders   MM DIGITAL SCREENING BILATERAL   Uncontrolled type 2 diabetes mellitus without complication, with long-term current use of insulin ([place])       Relevant Medications   lisinopril (PRINIVIL,ZESTRIL) 2.5 MG tablet       Return in about 3 months (around 09/19/2017) for re-evalaution of blood pressure, blood sugar, cholesterol.   Fara Chute, PA-C [facility]

## 2017-06-22 LAB — COMPREHENSIVE METABOLIC PANEL
A/G RATIO: 1.2 (ref 1.2–2.2)
ALBUMIN: 4.1 g/dL (ref 3.5–5.5)
ALT: 25 IU/L (ref 0–32)
AST: 35 IU/L (ref 0–40)
Alkaline Phosphatase: 78 IU/L (ref 39–117)
BILIRUBIN TOTAL: 0.2 mg/dL (ref 0.0–1.2)
BUN / CREAT RATIO: 33 — AB (ref 9–23)
BUN: 19 mg/dL (ref 6–24)
CHLORIDE: 100 mmol/L (ref 96–106)
CO2: 23 mmol/L (ref 20–29)
Calcium: 9.7 mg/dL (ref 8.7–10.2)
Creatinine, Ser: 0.58 mg/dL (ref 0.57–1.00)
GFR calc non Af Amer: 109 mL/min/{1.73_m2} (ref >59)
GFR, EST AFRICAN AMERICAN: 126 mL/min/{1.73_m2} (ref >59)
Globulin, Total: 3.5 g/dL (ref 1.5–4.5)
Glucose: 164 mg/dL — ABNORMAL HIGH (ref 65–99)
POTASSIUM: 4.3 mmol/L (ref 3.5–5.2)
Sodium: 137 mmol/L (ref 134–144)
TOTAL PROTEIN: 7.6 g/dL (ref 6.0–8.5)

## 2017-06-22 LAB — CBC WITH DIFFERENTIAL/PLATELET
BASOS: 0 %
Basophils Absolute: 0 10*3/uL (ref 0.0–0.2)
EOS (ABSOLUTE): 0.4 10*3/uL (ref 0.0–0.4)
Eos: 4 %
HEMOGLOBIN: 8.7 g/dL — AB (ref 11.1–15.9)
Hematocrit: 27.6 % — ABNORMAL LOW (ref 34.0–46.6)
IMMATURE GRANS (ABS): 0 10*3/uL (ref 0.0–0.1)
Immature Granulocytes: 0 %
LYMPHS ABS: 2.6 10*3/uL (ref 0.7–3.1)
LYMPHS: 27 %
MCH: 21.1 pg — AB (ref 26.6–33.0)
MCHC: 31.5 g/dL (ref 31.5–35.7)
MCV: 67 fL — AB (ref 79–97)
MONOCYTES: 5 %
Monocytes Absolute: 0.5 10*3/uL (ref 0.1–0.9)
NEUTROS ABS: 6.2 10*3/uL (ref 1.4–7.0)
Neutrophils: 64 %
Platelets: 511 10*3/uL — ABNORMAL HIGH (ref 150–379)
RBC: 4.12 x10E6/uL (ref 3.77–5.28)
RDW: 17.1 % — ABNORMAL HIGH (ref 12.3–15.4)
WBC: 9.7 10*3/uL (ref 3.4–10.8)

## 2017-06-22 LAB — LIPID PANEL
Chol/HDL Ratio: 3.2 ratio (ref 0.0–4.4)
Cholesterol, Total: 82 mg/dL — ABNORMAL LOW (ref 100–199)
HDL: 26 mg/dL — ABNORMAL LOW (ref >39)
LDL Calculated: 42 mg/dL (ref 0–99)
Triglycerides: 72 mg/dL (ref 0–149)
VLDL CHOLESTEROL CAL: 14 mg/dL (ref 5–40)

## 2017-06-22 LAB — MICROALBUMIN / CREATININE URINE RATIO
Creatinine, Urine: 63.1 mg/dL
MICROALB/CREAT RATIO: 135.7 mg/g{creat} — AB (ref 0.0–30.0)
MICROALBUM., U, RANDOM: 85.6 ug/mL

## 2017-06-22 LAB — HEMOGLOBIN A1C
Est. average glucose Bld gHb Est-mCnc: 252 mg/dL
HEMOGLOBIN A1C: 10.4 % — AB (ref 4.8–5.6)

## 2017-06-26 ENCOUNTER — Other Ambulatory Visit: Payer: Self-pay | Admitting: Physician Assistant

## 2017-06-26 DIAGNOSIS — D649 Anemia, unspecified: Secondary | ICD-10-CM

## 2017-06-26 NOTE — [place] Notes (Signed)
Orders Placed This Encounter  Procedures  . Iron, TIBC and Ferritin Panel    Standing Status:   Future    Standing Expiration Date:   07/27/2017  . Hemoglobinopathy evaluation    Standing Status:   Future    Standing Expiration Date:   07/27/2017

## 2017-06-29 ENCOUNTER — Other Ambulatory Visit: Payer: Self-pay

## 2017-06-29 ENCOUNTER — Ambulatory Visit: Payer: BLUE CROSS/BLUE SHIELD | Admitting: Physician Assistant

## 2017-06-29 ENCOUNTER — Encounter: Payer: Self-pay | Admitting: Physician Assistant

## 2017-06-29 VITALS — BP 108/62 | HR 102 | Temp 99.0°F | Resp 18 | Ht 64.0 in | Wt 167.2 lb

## 2017-06-29 DIAGNOSIS — R350 Frequency of micturition: Secondary | ICD-10-CM | POA: Diagnosis not present

## 2017-06-29 DIAGNOSIS — R319 Hematuria, unspecified: Secondary | ICD-10-CM | POA: Diagnosis not present

## 2017-06-29 DIAGNOSIS — R82998 Other abnormal findings in urine: Secondary | ICD-10-CM

## 2017-06-29 LAB — POCT URINALYSIS DIP (MANUAL ENTRY)
BILIRUBIN UA: NEGATIVE
BILIRUBIN UA: NEGATIVE mg/dL
Glucose, UA: NEGATIVE mg/dL
Nitrite, UA: NEGATIVE
PROTEIN UA: NEGATIVE mg/dL
SPEC GRAV UA: 1.025 (ref 1.010–1.025)
Urobilinogen, UA: 0.2 E.U./dL
pH, UA: 5.5 (ref 5.0–8.0)

## 2017-06-29 MED ORDER — NITROFURANTOIN MONOHYD MACRO 100 MG PO CAPS: 100.0000 mg | ORAL_CAPSULE | Freq: Two times a day (BID) | ORAL | 0 refills | Status: AC

## 2017-06-29 NOTE — Patient Instructions (Addendum)
  Your results indicate you have a UTI. I have given you a prescription for an antibiotic. Please take with food. I have sent off a urine culture and we should have those results in 48 hours. If your symptoms worsen while you are awaiting these results or you develop fever, chills, flank pian, nausea and vomiting, please seek care immediately.    Urinary Tract Infection, Adult A urinary tract infection (UTI) is an infection of any part of the urinary tract. The urinary tract includes the:  Kidneys.  Ureters.  Bladder.  Urethra.  These organs make, store, and get rid of pee (urine) in the body. Follow these instructions at home:  Take over-the-counter and prescription medicines only as told by your doctor.  If you were prescribed an antibiotic medicine, take it as told by your doctor. Do not stop taking the antibiotic even if you start to feel better.  Avoid the following drinks: ? Alcohol. ? Caffeine. ? Tea. ? Carbonated drinks.  Drink enough fluid to keep your pee clear or pale yellow.  Keep all follow-up visits as told by your doctor. This is important.  Make sure to: ? Empty your bladder often and completely. Do not to hold pee for long periods of time. ? Empty your bladder before and after sex. ? Wipe from front to back after a bowel movement if you are female. Use each tissue one time when you wipe. Contact a doctor if:  You have back pain.  You have a fever.  You feel sick to your stomach (nauseous).  You throw up (vomit).  Your symptoms do not get better after 3 days.  Your symptoms go away and then come back. Get help right away if:  You have very bad back pain.  You have very bad lower belly (abdominal) pain.  You are throwing up and cannot keep down any medicines or water. This information is not intended to replace advice given to you by your health care provider. Make sure you discuss any questions you have with your health care provider. Document  Released: 11/23/2007 Document Revised: 11/12/2015 Document Reviewed: 04/27/2015 Elsevier Interactive Patient Education  2018 Elsevier Inc.    IF you received an x-ray today, you will receive an invoice from [facility]. Please contact [facility] at [phone] with questions or concerns regarding your invoice.   IF you received labwork today, you will receive an invoice from [facility]. Please contact [facility] at [phone] with questions or concerns regarding your invoice.   Our billing staff will not be able to assist you with questions regarding bills from these companies.  You will be contacted with the lab results as soon as they are available. The fastest way to get your results is to activate your My Chart account. Instructions are located on the last page of this paperwork. If you have not heard from us regarding the results in 2 weeks, please contact this office.     

## 2017-06-29 NOTE — [place] Notes (Signed)
06/29/2017 at 1:45 PM  Shelley Escobar / DOB: 01-Jul-1970 / MRN: [id number]  The patient has Diabetes mellitus type 2, uncontrolled, without complications ([place]); Dyslipidemia; Obesity (BMI 30.0-34.9); Fatty infiltration of liver; and Anemia on their problem list.  SUBJECTIVE  Shelley Escobar Shelley Escobar is a 47 y.o. female who complains of dysuria, hematuria, suprapubic pressure, urinary frequency and urinary urgency x 2 days. Shelley Escobar denies flank pain, pelvic pain, genital rash, genital irritation and vaginal discharge. Has not tried anything for relief. Most recent UTI prior to this was years ago. Denies smoking. Shelley Escobar is not sexually active. LMP 06/29/17.  Shelley Escobar  has a past medical history of Acute appendicitis (01/13/2016), Anemia, DM (diabetes mellitus) ([place]), Hyperlipidemia, SVD (spontaneous vaginal delivery), and Vitamin D deficiency.    Medications reviewed and updated by myself where necessary, and exist elsewhere in the encounter.   Ms. Romanoff has No Known Allergies. Shelley Escobar  reports that  has never smoked. Shelley Escobar has never used smokeless tobacco. Shelley Escobar reports that Shelley Escobar does not drink alcohol or use drugs. Shelley Escobar  reports that Shelley Escobar currently engages in sexual activity and has had partners who are Female. Shelley Escobar reports using the following method of birth control/protection: Surgical. The patient  has a past surgical history that includes Tubal ligation; Hysteroscopy w/D&C (N/A, 08/07/2013); Intrauterine device (iud) insertion (09/13/2013); and laparoscopic appendectomy (N/A, 01/13/2016).  Her Shelley Escobar was adopted. Family history is unknown by patient.  Review of Systems  Constitutional: Negative for chills, diaphoresis and fever.  Gastrointestinal: Negative for nausea and vomiting.    OBJECTIVE  Her  height is 5\' 4"  (1.626 m) and weight is 167 lb 3.2 oz (75.8 kg). Her oral temperature is 99 F (37.2 C). Her blood pressure is 108/62 and her pulse is 102 (abnormal). Her respiration is 18 and oxygen saturation is 98%.  The patient's body mass  index is 28.7 kg/m.  Physical Exam  Constitutional: Shelley Escobar is oriented to person, place, and time. Shelley Escobar appears well-developed and well-nourished.  HENT:  Head: Normocephalic and atraumatic.  Eyes: Conjunctivae are normal.  Neck: Normal range of motion.  Pulmonary/Chest: Effort normal.  Abdominal: Soft. Normal appearance and bowel sounds are normal. There is no tenderness. There is no CVA tenderness.  Neurological: Shelley Escobar is alert and oriented to person, place, and time.  Skin: Skin is warm and dry.  Psychiatric: Shelley Escobar has a normal mood and affect.  Vitals reviewed.   Results for orders placed or performed in visit on 06/29/17 (from the past 24 hour(s))  POCT urinalysis dipstick     Status: Abnormal   Collection Time: 06/29/17 11:45 AM  Result Value Ref Range   Color, UA yellow yellow   Clarity, UA cloudy (A) clear   Glucose, UA negative negative mg/dL   Bilirubin, UA negative negative   Ketones, POC UA negative negative mg/dL   Spec Grav, UA 1.025 1.010 - 1.025   Blood, UA moderate (A) negative   pH, UA 5.5 5.0 - 8.0   Protein Ur, POC negative negative mg/dL   Urobilinogen, UA 0.2 0.2 or 1.0 E.U./dL   Nitrite, UA Negative Negative   Leukocytes, UA Large (3+) (A) Negative    ASSESSMENT & PLAN  Marti Mclane was seen today for urinary frequency, dysuria and back pain.  Diagnoses and all orders for this visit:  Frequency of urination -     POCT urinalysis dipstick -     Urine Culture  Leukocytes in urine -     nitrofurantoin, macrocrystal-monohydrate, (MACROBID) 100  MG capsule; Take 1 capsule (100 mg total) by mouth 2 (two) times daily for 5 days.  Hematuria, unspecified type -     nitrofurantoin, macrocrystal-monohydrate, (MACROBID) 100 MG capsule; Take 1 capsule (100 mg total) by mouth 2 (two) times daily for 5 days.   UA with large leukocytes and moderate hematuria.  Will treat empirically for UTI at this time.  Urine culture pending. The patient was advised to call or come  back to clinic if Shelley Escobar does not see an improvement in symptoms, or worsens with the above plan.   Tenna Delaine, PA-C [facility] 06/29/2017 1:45 PM

## 2017-07-01 LAB — URINE CULTURE

## 2017-07-04 ENCOUNTER — Other Ambulatory Visit: Payer: Self-pay | Admitting: Physician Assistant

## 2017-07-04 DIAGNOSIS — Z1231 Encounter for screening mammogram for malignant neoplasm of breast: Secondary | ICD-10-CM

## 2017-07-05 ENCOUNTER — Other Ambulatory Visit: Payer: Self-pay | Admitting: Physician Assistant

## 2017-07-05 DIAGNOSIS — Z794 Long term (current) use of insulin: Principal | ICD-10-CM

## 2017-07-05 DIAGNOSIS — IMO0001 Reserved for inherently not codable concepts without codable children: Secondary | ICD-10-CM

## 2017-07-05 DIAGNOSIS — E1165 Type 2 diabetes mellitus with hyperglycemia: Principal | ICD-10-CM

## 2017-07-11 ENCOUNTER — Other Ambulatory Visit: Payer: Self-pay | Admitting: Physician Assistant

## 2017-07-11 DIAGNOSIS — IMO0001 Reserved for inherently not codable concepts without codable children: Secondary | ICD-10-CM

## 2017-07-11 DIAGNOSIS — E1165 Type 2 diabetes mellitus with hyperglycemia: Principal | ICD-10-CM

## 2017-07-11 DIAGNOSIS — Z794 Long term (current) use of insulin: Principal | ICD-10-CM

## 2017-07-11 NOTE — Telephone Encounter (Signed)
See refill request from pharmacy for Lantus Solostar Pen.  Last office visit was 06/21/17 with Daphane Shepherd; for DM management she documented:  "Anticipate need for mealtime insulin. She has insurance again and will likely need re-evaluation with endocrinology."

## 2017-07-14 ENCOUNTER — Encounter: Payer: Self-pay | Admitting: Physician Assistant

## 2017-07-14 ENCOUNTER — Ambulatory Visit: Payer: BLUE CROSS/BLUE SHIELD | Admitting: Physician Assistant

## 2017-07-14 VITALS — BP 108/58 | HR 113 | Temp 99.0°F | Resp 18 | Ht 64.0 in | Wt 167.0 lb

## 2017-07-14 DIAGNOSIS — R6889 Other general symptoms and signs: Secondary | ICD-10-CM | POA: Diagnosis not present

## 2017-07-14 LAB — POC INFLUENZA A&B (BINAX/QUICKVUE)
Influenza A, POC: NEGATIVE
Influenza B, POC: NEGATIVE

## 2017-07-14 MED ORDER — AZELASTINE HCL 0.1 % NA SOLN: 2.0000 | Freq: Two times a day (BID) | NASAL | 0 refills | Status: DC

## 2017-07-14 MED ORDER — BENZONATATE 100 MG PO CAPS: 100.0000 mg | ORAL_CAPSULE | Freq: Three times a day (TID) | ORAL | 0 refills | Status: DC | PRN

## 2017-07-14 MED ORDER — GUAIFENESIN ER 1200 MG PO TB12: 1.0000 | ORAL_TABLET | Freq: Two times a day (BID) | ORAL | 1 refills | Status: DC | PRN

## 2017-07-14 NOTE — Patient Instructions (Addendum)
Stay hydrated with lots of water! Stay well rested and get lots of sleep.     We recommend that you schedule a mammogram for breast cancer screening. Typically, you do not need a referral to do this. Please contact a local imaging center to schedule your mammogram.  [facility] - [phone]  *ask for the Radiology Department [facility] ([place]) - [phone] or [phone]  [facility] - [phone] [place] [phone] [facility] - [phone]  *ask for the [facility] - [phone]  *ask for the Radiology Department [facility] - [phone]  *ask for the [place] - [phone]   IF you received an x-ray today, you will receive an invoice from [facility]. Please contact [facility] at [phone] with questions or concerns regarding your invoice.   IF you received labwork today, you will receive an invoice from [place] [facility]. Please contact [facility] at [phone] with questions or concerns regarding your invoice.   Our billing staff will not be able to assist you with questions regarding bills from these companies.  You will be contacted with the lab results as soon as they are available. The fastest way to get your results is to activate your My Chart account. Instructions are located on the last page of this paperwork. If you have not heard from [place] regarding the results in 2 weeks, please contact this office.     Nhi?m trng ???ng h h?p do vi rt Viral Respiratory Infection Nhi?m trng ???ng h h?p l b?nh ?nh h??ng ??n cc b? ph?n c?a h? h h?p, ch?ng h?n nh? ph?i, m?i ho?c h?ng. H?u h?t cc b?nh nhi?m trng ???ng h h?p l do vi rt ho?c vi khu?n gy ra. Nhi?m trng ???ng h h?p do vi rt gy ra ???c g?i l nhi?m trng ???ng h h?p do vi rt. Cc lo?i nhi?m trng ???ng h h?p do vi rt th??ng g?p  bao g?m:  C?m l?nh.  B?nh cm (cm).  Nhi?m vi rt h?p bo ???ng h h?p (RSV).  Ti bi?t mnh b? nhi?m trng ???ng h h?p do vi rt b?ng cch no? H?u h?t cc b?nh nhi?m trng ???ng h h?p do vi rt gy ra:  Ch?y n??c m?i ho?c ng?t m?i.  N??c m?i mu vng ho?c xanh.  Ho.  H?t h?i.  M?t m?i.  ?au nh?c c?.  ?au h?ng.  ?? m? hi ho?c ?n l?nh.  S?t.  ?au ??u.  ?i?u tr? nhi?m trng ???ng h h?p b?ng cch no? N?u b?nh cm ???c ch?n ?on s?m, n c th? ???c ?i?u tr? b?ng thu?c khng vi rt gip ng?n th?i gian c cc tri?u ch?ng c?a m?t ng??i. Cc tri?u ch?ng c?a nhi?m trng ???ng h h?p do vi rt c th? ???c ?i?u tr? b?ng cc thu?c k ??n v khng k ??n, ch?ng h?n nh?:  Thu?c long ??m. Nh?ng thu?c ny lm ho ra ??m d? h?n.  Thu?c x?t m?i lm thng m?i.  Chuyn gia ch?m [place] s?c kh?e khng k ??n thu?c khng sinh ?? ?i?u tr? nhi?m vi rt. ?i?u ny l b?i v thu?c khng sinh ???c thi?t k? ?? tiu di?t vi khu?n. Cc thu?c ny khng c tc d?ng trn vi rt. Lm sao ?? bi?t l ti nn ngh? h?c ho?c ngh? lm ?? ? nh? ?? trnh khng cho ng??i khc ti?p xc v?i nhi?m trng ???ng h h?p  c?a qu v?, hy ? nh n?u qu v? b?:  S?t.  Ho dai d?ng.  ?au h?ng.  Ch?y n??c m?i.  H?t h?i.  ?au nh?c c?.  ?au ??u.  M?t m?i.  Y?u.  ?n l?nh.  ?? m? hi.  Bu?n nn.  Tun th? nh?ng h??ng d?n ny ? nh:  Ngh? ng?i cng nhi?u cng t?t.  Ch? s? d?ng thu?c khng k ??n v thu?c k ??n theo ch? d?n c?a chuyn gia ch?m [place] s?c kh?e.  U?ng ?? n??c ?? gi? cho n??c ti?u trong ho?c c mu vng nh?t. ?i?u ny gip ng?n ng?a m?t n??c v gip long ??m.  Xc mi?ng b?ng h?n h?p n??c mu?i 3-4 l?n m?i ngy ho?c khi c?n. ?? lm m?t h?n h?p n??c mu?i, hy [place] tan [place] ton -1 tha c ph mu?i trong 1 c?c n??c ?m.  S? d?ng thu?c nh? m?i lm b?ng n??c mu?i ?? h?t ng?t m?i v lm m?m da xung quanh m?i.  Khng u?ng r??u.  Khng s? d?ng cc s?n ph?m thu?c l, bao g?m thu?c l d?ng ht,  thu?c l d?ng nhai v thu?c l ?i?n t?. N?u qu v? c?n gip ?? ?? cai thu?c, hy h?i chuyn gia ch?m [place] s?c kh?e. Hy lin l?c v?i chuyn gia ch?m [place] s?c kh?e n?u:  Cc tri?u ch?ng c?a qu v? ko di 10 ngy tr? ln.  Cc tri?u ch?ng c?a qu v? tr?m tr?ng h?n.  Qu v? b? s?t.  Qu v? b? ?au xoang ? m?t v trn r?t nhi?u.  Cc tuy?n ? hm ho?c c? c?a qu v? b? s?ng to. Yu c?u tr? gip ngay l?p t?c n?u:  Qu v? c?m th?y ?au ho?c t?c ng?c.  Qu v? b? kh th?.  Qu v? ng?t ho?c c?m th?y nh? mnh s? ng?t ?i.  Qu v? b? nn r?t nhi?u v dai d?ng.  Qu v? c?m th?y l l?n ho?c m?t ph??ng h??ng. Thng tin ny khng nh?m m?c ?ch thay th? cho l?i khuyn m chuyn gia ch?m [place] s?c kh?e ni v?i qu v?. Hy b?o ??m qu v? ph?i th?o lu?n b?t k? v?n ?? g m qu v? c v?i chuyn gia ch?m [place] s?c kh?e c?a qu v?. Document Released: 06/06/2005 Document Revised: 09/23/2016 Document Reviewed: 11/12/2014 Elsevier Interactive Patient Education  2018 [employer].

## 2017-07-14 NOTE — Progress Notes (Signed)
Subjective:    Patient ID: Shelley Escobar, female    DOB: [birth year], 47 y.o.   MRN: [id number]  Chief Complaint  Patient presents with  . Flu like Symptoms    x4 days, pt is experiencing chills, headache, coughing, and runny nose.   She presents today for flu like symptoms. Started feeling sick Monday night. Symptoms include: Feverish, chills, dry cough mainly at night, runny nose, headache, and very fatigued. Difficulty sleeping due to chills and cough. Symptoms have been consistent since Monday night, have not worsened or improved.  No sick contacts.  Has only taken Aspirin. Afraid anything else would raise her blood sugar.   Review of Systems  Constitutional: Negative for diaphoresis and unexpected weight change.  HENT: Negative for ear discharge, ear pain, sore throat and voice change.   Respiratory: Negative for chest tightness, shortness of breath and wheezing.   Cardiovascular: Negative for chest pain and palpitations.  Gastrointestinal: Negative for abdominal pain, constipation, diarrhea, nausea and vomiting.  Musculoskeletal: Negative for myalgias.  Neurological: Negative for dizziness, weakness and numbness.  Hematological: Negative for adenopathy.   Patient Active Problem List   Diagnosis Date Noted  . Fatty infiltration of liver 03/10/2016  . Anemia 03/10/2016  . Obesity (BMI 30.0-34.9) 01/27/2012  . Diabetes mellitus type 2, uncontrolled, without complications ([place]) 76/81/1572  . Dyslipidemia 07/02/2011   Prior to Admission medications   Medication Sig Start Date End Date Taking? Authorizing Provider  gemfibrozil (LOPID) 600 MG tablet TAKE 1 TABLET(600 MG) BY MOUTH TWICE DAILY BEFORE A MEAL 06/21/17  Yes Jeffery, Chelle, PA-C  insulin aspart (NOVOLOG FLEXPEN) 100 UNIT/ML FlexPen Inject 5 units into the skin before each meal 11/29/16  Yes Elayne Snare, MD  Insulin Pen Needle (B-D UF III MINI PEN NEEDLES) 31G X 5 MM MISC USE DAILY AS DIRECTED WITH INSULIN 08/05/16  Yes  Jeffery, Chelle, PA-C  LANTUS SOLOSTAR 100 UNIT/ML Solostar Pen INJECT 50 UNITS UNDER SKIN TWICE DAILY 03/19/17  Yes Elayne Snare, MD  LANTUS SOLOSTAR 100 UNIT/ML Solostar Pen ADMINISTER 46 UNITS UNDER THE SKIN DAILY 07/12/17  Yes Jeffery, Chelle, PA-C  lisinopril (PRINIVIL,ZESTRIL) 2.5 MG tablet Take 1 tablet (2.5 mg total) by mouth daily. 06/21/17  Yes Jeffery, Chelle, PA-C  metFORMIN (GLUCOPHAGE) 1000 MG tablet TAKE 1 TABLET(1000 MG) BY MOUTH TWICE DAILY WITH A MEAL 08/05/16  Yes Jeffery, Chelle, PA-C  metFORMIN (GLUCOPHAGE) 1000 MG tablet TAKE 1 TABLET BY MOUTH TWICE DAILY WITH MEALS 07/06/17  Yes Jeffery, Chelle, PA-C                        No Known Allergies     Objective:   Physical Exam  Constitutional: She is oriented to person, place, and time. She appears well-developed and well-nourished.  HENT:  Head: Normocephalic.  Right Ear: Tympanic membrane, external ear and ear canal normal.  Left Ear: Tympanic membrane, external ear and ear canal normal.  Nose: Rhinorrhea present.  Mouth/Throat: Uvula is midline and oropharynx is clear and moist. No oropharyngeal exudate, posterior oropharyngeal edema, posterior oropharyngeal erythema or tonsillar abscesses.  Eyes: Conjunctivae are normal. Right eye exhibits no discharge. Left eye exhibits no discharge. No scleral icterus.  Cardiovascular: Normal rate, regular rhythm, normal heart sounds and intact distal pulses. Exam reveals no gallop and no friction rub.  No murmur heard. Pulmonary/Chest: Effort normal and breath sounds normal. No accessory muscle usage. No respiratory distress. She has no wheezes. She has no rales. She exhibits  no tenderness.  Lymphadenopathy:    She has no cervical adenopathy.  Neurological: [name], place, and time.  Psychiatric: She has a normal mood and affect. Her behavior is normal.   Results for orders placed or performed in visit on 07/14/17 (from the past 24 hour(s))  POC Influenza  A&B(BINAX/QUICKVUE)     Status: None   Collection Time: 07/14/17 12:30 PM  Result Value Ref Range   Influenza A, POC Negative Negative   Influenza B, POC Negative Negative      Assessment & Plan:  1. Flu-like symptoms POC Influenza negative. Start Azelastine, Benzonatate, Guaifenesin. Instructed to stay well hydrated and well rested.   - POC Influenza A&B(BINAX/QUICKVUE) - azelastine (ASTELIN) 0.1 % nasal spray; Place 2 sprays into both nostrils 2 (two) times daily. Use in each nostril as directed  Dispense: 30 mL; Refill: 0 - benzonatate (TESSALON) 100 MG capsule; Take 1-2 capsules (100-200 mg total) by mouth 3 (three) times daily as needed for cough.  Dispense: 40 capsule; Refill: 0 - Guaifenesin (MUCINEX MAXIMUM STRENGTH) 1200 MG TB12; Take 1 tablet (1,200 mg total) by mouth every 12 (twelve) hours as needed.  Dispense: 14 tablet; Refill: 1  Return if symptoms worsen or fail to improve.  Noemi Chapel, PA-S

## 2017-07-14 NOTE — Progress Notes (Signed)
Patient ID: Shelley Escobar, female    DOB: 22-Oct-1970, 47 y.o.   MRN: [id number]  PCP: Harrison Mons, PA-C  Chief Complaint  Patient presents with  . Flu like Symptoms    x4 days, pt is experiencing chills, headache, coughing, and runny nose.    Subjective:   Presents for evaluation of flu-like symptoms x 4 days. She is accompanied by her son, who translates.  Symptoms developed the evening of 1/20: subjective fever, chills, runny nose, headache. Cough is non-productive, and occurs primarily at night.  No known sick contacts. Has taken ASA, afraid that anything else would cause elevated glucose.    Review of Systems Constitutional: Negative for diaphoresis and unexpected weight change.  HENT: Negative for ear discharge, ear pain, sore throat and voice change.   Respiratory: Negative for chest tightness, shortness of breath and wheezing.   Cardiovascular: Negative for chest pain and palpitations.  Gastrointestinal: Negative for abdominal pain, constipation, diarrhea, nausea and vomiting.  Musculoskeletal: Negative for myalgias.  Neurological: Negative for dizziness, weakness and numbness.  Hematological: Negative for adenopathy.       Patient Active Problem List   Diagnosis Date Noted  . Fatty infiltration of liver 03/10/2016  . Anemia 03/10/2016  . Obesity (BMI 30.0-34.9) 01/27/2012  . Diabetes mellitus type 2, uncontrolled, without complications ([place]) 02/58/5277  . Dyslipidemia 07/02/2011     Prior to Admission medications   Medication Sig Start Date End Date Taking? Authorizing Provider  gemfibrozil (LOPID) 600 MG tablet TAKE 1 TABLET(600 MG) BY MOUTH TWICE DAILY BEFORE A MEAL 06/21/17  Yes Adren Dollins, PA-C  insulin aspart (NOVOLOG FLEXPEN) 100 UNIT/ML FlexPen Inject 5 units into the skin before each meal 11/29/16  Yes Elayne Snare, MD  Insulin Pen Needle (B-D UF III MINI PEN NEEDLES) 31G X 5 MM MISC USE DAILY AS DIRECTED WITH INSULIN 08/05/16  Yes Wesson Stith,  Arminda Foglio, PA-C  LANTUS SOLOSTAR 100 UNIT/ML Solostar Pen INJECT 50 UNITS UNDER SKIN TWICE DAILY 03/19/17  Yes Elayne Snare, MD  LANTUS SOLOSTAR 100 UNIT/ML Solostar Pen ADMINISTER 46 UNITS UNDER THE SKIN DAILY 07/12/17  Yes Anaysia Germer, PA-C  lisinopril (PRINIVIL,ZESTRIL) 2.5 MG tablet Take 1 tablet (2.5 mg total) by mouth daily. 06/21/17  Yes Almyra Birman, PA-C  metFORMIN (GLUCOPHAGE) 1000 MG tablet TAKE 1 TABLET(1000 MG) BY MOUTH TWICE DAILY WITH A MEAL 08/05/16  Yes Akanksha Bellmore, PA-C  metFORMIN (GLUCOPHAGE) 1000 MG tablet TAKE 1 TABLET BY MOUTH TWICE DAILY WITH MEALS 07/06/17  Yes Tyann Niehaus, PA-C     No Known Allergies     Objective:  Physical Exam  Constitutional: She is oriented to person, place, and time. She appears well-developed and well-nourished. No distress.  BP (!) 108/58 (BP Location: Left Arm, Patient Position: Sitting, Cuff Size: Normal)   Pulse (!) 113   Temp 99 F (37.2 C) (Oral)   Resp 18   Ht 5\' 4"  (1.626 m)   Wt 167 lb (75.8 kg)   SpO2 98%   BMI 28.67 kg/m    HENT:  Head: Normocephalic and atraumatic.  Right Ear: Hearing, tympanic membrane, external ear and ear canal normal.  Left Ear: Hearing, tympanic membrane, external ear and ear canal normal.  Nose: Mucosal edema and rhinorrhea present.  No foreign bodies. Right sinus exhibits no maxillary sinus tenderness and no frontal sinus tenderness. Left sinus exhibits no maxillary sinus tenderness and no frontal sinus tenderness.  Mouth/Throat: Uvula is midline, oropharynx is clear and moist and mucous membranes  are normal. No uvula swelling. No oropharyngeal exudate.  Eyes: Conjunctivae and EOM are normal. Pupils are equal, round, and reactive to light. Right eye exhibits no discharge. Left eye exhibits no discharge. No scleral icterus.  Neck: Trachea normal, normal range of motion and full passive range of motion without pain. Neck supple. No thyroid mass and no thyromegaly present.  Cardiovascular: Normal  rate, regular rhythm and normal heart sounds.  Pulmonary/Chest: Effort normal and breath sounds normal.  Lymphadenopathy:       Head (right side): No submandibular, no tonsillar, no preauricular, no posterior auricular and no occipital adenopathy present.       Head (left side): No submandibular, no tonsillar, no preauricular and no occipital adenopathy present.    She has no cervical adenopathy.       Right: No supraclavicular adenopathy present.       Left: No supraclavicular adenopathy present.  Neurological: [name], place, and time. She has normal strength. No cranial nerve deficit or sensory deficit.  Skin: Skin is warm, dry and intact. No rash noted.  Psychiatric: She has a normal mood and affect. Her speech is normal and behavior is normal.       Results for orders placed or performed in visit on 07/14/17  POC Influenza A&B(BINAX/QUICKVUE)  Result Value Ref Range   Influenza A, POC Negative Negative   Influenza B, POC Negative Negative       Assessment & Plan:   1. Flu-like symptoms No body aches, which we have been seeing this year with influenza, so suspect non-influenza URI. No evidence of sinusitis or bronchitis/pneumonia. Reassurance. Supportive care. ANticipatory guidance. - POC Influenza A&B(BINAX/QUICKVUE) - azelastine (ASTELIN) 0.1 % nasal spray; Place 2 sprays into both nostrils 2 (two) times daily. Use in each nostril as directed  Dispense: 30 mL; Refill: 0 - benzonatate (TESSALON) 100 MG capsule; Take 1-2 capsules (100-200 mg total) by mouth 3 (three) times daily as needed for cough.  Dispense: 40 capsule; Refill: 0 - Guaifenesin (MUCINEX MAXIMUM STRENGTH) 1200 MG TB12; Take 1 tablet (1,200 mg total) by mouth every 12 (twelve) hours as needed.  Dispense: 14 tablet; Refill: 1    Return if symptoms worsen or fail to improve.   Fara Chute, PA-C [facility] [place]

## 2017-07-24 ENCOUNTER — Ambulatory Visit: Payer: BLUE CROSS/BLUE SHIELD

## 2017-07-24 ENCOUNTER — Ambulatory Visit: Payer: BLUE CROSS/BLUE SHIELD | Admitting: Physician Assistant

## 2017-07-24 VITALS — BP 108/60 | HR 110 | Temp 99.5°F | Resp 16 | Ht 64.0 in | Wt 161.0 lb

## 2017-07-24 DIAGNOSIS — J0140 Acute pansinusitis, unspecified: Secondary | ICD-10-CM | POA: Diagnosis not present

## 2017-07-24 DIAGNOSIS — R059 Cough, unspecified: Secondary | ICD-10-CM

## 2017-07-24 DIAGNOSIS — R05 Cough: Secondary | ICD-10-CM | POA: Diagnosis not present

## 2017-07-24 MED ORDER — DOXYCYCLINE HYCLATE 100 MG PO CAPS: 100.0000 mg | ORAL_CAPSULE | Freq: Two times a day (BID) | ORAL | 0 refills | Status: AC

## 2017-07-24 MED ORDER — GUAIFENESIN-CODEINE 100-10 MG/5ML PO SYRP: 5.0000 mL | ORAL_SOLUTION | Freq: Every evening | ORAL | 0 refills | Status: DC | PRN

## 2017-07-24 NOTE — Progress Notes (Signed)
PRIMARY CARE AT [facility] [address] [place] [address] [phone]  Date:  07/24/2017   Name:  Shelley Escobar   DOB:  12-Jul-1970   MRN:  [id number]  PCP:  Harrison Mons, PA-C    History of Present Illness:  Shelley Escobar is a 47 y.o. female patient who presents to PCP with  Chief Complaint  Patient presents with  . Cough    pt was seen 07/06/17 for flu like sym, but was neg for flu, she states she is not better.  Marland Kitchen Headache     Patient was here for flu like symptoms about 2 weeks ago. Negative flu test She notes that she continues to have congestion and cough.  This has worsened to headache at her frontal sinus area. She notes no fever, but feels fatigue.   No dizziness, aura, photo/phono-phobia. No sob or dyspnea. Cough non-productive.  Patient Active Problem List   Diagnosis Date Noted  . Fatty infiltration of liver 03/10/2016  . Anemia 03/10/2016  . Obesity (BMI 30.0-34.9) 01/27/2012  . Diabetes mellitus type 2, uncontrolled, without complications ([place]) 89/21/1941  . Dyslipidemia 07/02/2011    Past Medical History:  Diagnosis Date  . Acute appendicitis 01/13/2016  . Anemia    hx  . DM (diabetes mellitus) ([place])   . Hyperlipidemia   . SVD (spontaneous vaginal delivery)    x 3  . Vitamin D deficiency     Past Surgical History:  Procedure Laterality Date  . HYSTEROSCOPY W/D&C N/A 08/07/2013   Procedure: DILATATION AND CURETTAGE /HYSTEROSCOPY;  Surgeon: Azalia Bilis, MD;  Location: [facility] [place];  Service: Gynecology;  Laterality: N/A;  . INTRAUTERINE DEVICE (IUD) INSERTION  09/13/2013  . LAPAROSCOPIC APPENDECTOMY N/A 01/13/2016   Procedure: APPENDECTOMY LAPAROSCOPIC;  Surgeon: Armandina Gemma, MD;  Location: [facility];  Service: General;  Laterality: N/A;  . TUBAL LIGATION     Age 68    Social History   Tobacco Use  . Smoking status: Never Smoker  . Smokeless tobacco: Never Used  Substance Use Topics  . Alcohol use: No    Alcohol/week: 0.0 oz  . Drug use: No     Family History  Adopted: Yes  Family history unknown: Yes    No Known Allergies  Medication list has been reviewed and updated.  Current Outpatient Medications on File Prior to Visit  Medication Sig Dispense Refill  . azelastine (ASTELIN) 0.1 % nasal spray Place 2 sprays into both nostrils 2 (two) times daily. Use in each nostril as directed 30 mL 0  . benzonatate (TESSALON) 100 MG capsule Take 1-2 capsules (100-200 mg total) by mouth 3 (three) times daily as needed for cough. 40 capsule 0  . gemfibrozil (LOPID) 600 MG tablet TAKE 1 TABLET(600 MG) BY MOUTH TWICE DAILY BEFORE A MEAL 180 tablet 3  . Guaifenesin (MUCINEX MAXIMUM STRENGTH) 1200 MG TB12 Take 1 tablet (1,200 mg total) by mouth every 12 (twelve) hours as needed. 14 tablet 1  . insulin aspart (NOVOLOG FLEXPEN) 100 UNIT/ML FlexPen Inject 5 units into the skin before each meal 4 pen 3  . Insulin Pen Needle (B-D UF III MINI PEN NEEDLES) 31G X 5 MM MISC USE DAILY AS DIRECTED WITH INSULIN 100 each 3  . LANTUS SOLOSTAR 100 UNIT/ML Solostar Pen INJECT 50 UNITS UNDER SKIN TWICE DAILY 15 mL 0  . LANTUS SOLOSTAR 100 UNIT/ML Solostar Pen ADMINISTER 46 UNITS UNDER THE SKIN DAILY 45 mL 0  . lisinopril (PRINIVIL,ZESTRIL) 2.5 MG tablet  Take 1 tablet (2.5 mg total) by mouth daily. 90 tablet 3  . metFORMIN (GLUCOPHAGE) 1000 MG tablet TAKE 1 TABLET(1000 MG) BY MOUTH TWICE DAILY WITH A MEAL 180 tablet 3  . metFORMIN (GLUCOPHAGE) 1000 MG tablet TAKE 1 TABLET BY MOUTH TWICE DAILY WITH MEALS 180 tablet 0   No current facility-administered medications on file prior to visit.     ROS ROS otherwise unremarkable unless listed above.  Physical Examination: BP 108/60   Pulse (!) 110   Temp 99.5 F (37.5 C) (Oral)   Resp 16   Ht 5\' 4"  (1.626 m)   Wt 161 lb (73 kg)   SpO2 96%   BMI 27.64 kg/m  Ideal Body Weight: Weight in (lb) to have BMI = 25: 145.3  Physical Exam  Constitutional: She is oriented to person, place, and time. She appears  well-developed and well-nourished. No distress.  HENT:  Head: Normocephalic and atraumatic.  Right Ear: Tympanic membrane, external ear and ear canal normal.  Left Ear: Tympanic membrane, external ear and ear canal normal.  Nose: Mucosal edema and rhinorrhea present. Right sinus exhibits no maxillary sinus tenderness and no frontal sinus tenderness. Left sinus exhibits no maxillary sinus tenderness and no frontal sinus tenderness.  Mouth/Throat: No uvula swelling. No oropharyngeal exudate, posterior oropharyngeal edema or posterior oropharyngeal erythema.  Eyes: Conjunctivae and EOM are normal. Pupils are equal, round, and reactive to light.  Cardiovascular: Normal rate and regular rhythm. Exam reveals no gallop, no distant heart sounds and no friction rub.  No murmur heard. Pulmonary/Chest: Effort normal. No respiratory distress. She has no decreased breath sounds. She has no wheezes. She has no rhonchi.  Lymphadenopathy:       Head (right side): No submandibular, no tonsillar, no preauricular and no posterior auricular adenopathy present.       Head (left side): No submandibular, no tonsillar, no preauricular and no posterior auricular adenopathy present.  Neurological: [name], place, and time.  Skin: She is not diaphoretic.  Psychiatric: She has a normal mood and affect. Her behavior is normal.     Assessment and Plan: Shelley Escobar is a 47 y.o. female who is here today for cc of  Chief Complaint  Patient presents with  . Cough    pt was seen 07/06/17 for flu like sym, but was neg for flu, she states she is not better.  Marland Kitchen Headache  --tx of a possible sinus infection.  Will treat possible lower respiration infection.   --advised to improve her --advised cough syrup otc at thsi time, with diabetic cough syrup.    Acute non-recurrent pansinusitis - Plan: doxycycline (VIBRAMYCIN) 100 MG capsule  Cough - Plan: guaiFENesin-codeine (ROBITUSSIN AC) 100-10 MG/5ML  syrup, CANCELED: DG Chest 2 View  Ivar Drape, PA-C [facility] 2/10/20199:10 PM

## 2017-07-24 NOTE — Patient Instructions (Addendum)
Please take the cough medication over the counter.  This will be diabetic friendly such as dextromethorphan for cough. Please make sure you are hydrating well with water. You really must manage your blood sugar.  Please watch out with not eating corn, peas, potatoes, breads, or pastas. I am going to treat you for a sinus infection.  But this will also cover for most lower respiratory infections as well.    IF you received an x-ray today, you will receive an invoice from [facility]. Please contact [facility] at [phone] with questions or concerns regarding your invoice.   IF you received labwork today, you will receive an invoice from [facility]. Please contact [facility] at [phone] with questions or concerns regarding your invoice.   Our billing staff will not be able to assist you with questions regarding bills from these companies.  You will be contacted with the lab results as soon as they are available. The fastest way to get your results is to activate your My Chart account. Instructions are located on the last page of this paperwork. If you have not heard from [place] regarding the results in 2 weeks, please contact this office.

## 2017-07-29 ENCOUNTER — Other Ambulatory Visit: Payer: Self-pay | Admitting: Physician Assistant

## 2017-07-29 DIAGNOSIS — Z794 Long term (current) use of insulin: Principal | ICD-10-CM

## 2017-07-29 DIAGNOSIS — E1165 Type 2 diabetes mellitus with hyperglycemia: Principal | ICD-10-CM

## 2017-07-29 DIAGNOSIS — IMO0001 Reserved for inherently not codable concepts without codable children: Secondary | ICD-10-CM

## 2017-07-30 ENCOUNTER — Encounter: Payer: Self-pay | Admitting: Physician Assistant

## 2017-08-02 ENCOUNTER — Encounter: Payer: Self-pay | Admitting: Physician Assistant

## 2017-08-02 ENCOUNTER — Ambulatory Visit: Payer: BLUE CROSS/BLUE SHIELD | Admitting: Physician Assistant

## 2017-08-02 ENCOUNTER — Other Ambulatory Visit: Payer: Self-pay

## 2017-08-02 VITALS — BP 98/60 | HR 87 | Temp 98.2°F | Resp 18 | Ht 64.17 in | Wt 163.6 lb

## 2017-08-02 DIAGNOSIS — M545 Low back pain, unspecified: Secondary | ICD-10-CM

## 2017-08-02 DIAGNOSIS — G8929 Other chronic pain: Secondary | ICD-10-CM | POA: Diagnosis not present

## 2017-08-02 DIAGNOSIS — M62838 Other muscle spasm: Secondary | ICD-10-CM

## 2017-08-02 MED ORDER — MELOXICAM 7.5 MG PO TABS: 7.5000 mg | ORAL_TABLET | Freq: Every day | ORAL | 0 refills | Status: DC

## 2017-08-02 MED ORDER — CYCLOBENZAPRINE HCL 5 MG PO TABS: 5.0000 mg | ORAL_TABLET | Freq: Three times a day (TID) | ORAL | 0 refills | Status: DC | PRN

## 2017-08-02 NOTE — Progress Notes (Signed)
Shelley Escobar  MRN: [id number] DOB: 07-Nov-1970  Subjective:   Shelley Escobar is a 47 y.o. female who presents for evaluation of low back pain. The patient has had recurrent self limited episodes of low back pain in the past. Current symptoms have been present for 1 month and are gradually worsening.  Onset was related to / precipitated by no known injury. The pain is located in the right lumbar area or left lumbar area and does not radiate. The pain is described as soreness and occurs intermittently. Symptoms are exacerbated by bending over, standing for more than several minutes and getting up from seated positions. Symptoms are improved by lying down. She has also tried one 200mg  tablet of ibuprofen for 1 week which provides some relief. She denies acute injury, heavy lifting, weakness in the right leg, weakness in the left leg, tingling in the right leg, tingling in the left leg, burning pain in the right leg, burning pain in the left leg, urinary hesitancy, urinary incontinence, urinary retention, bowel incontinence, constipation, impotence and groin/perineal numbness associated with the back pain. The patient has no "red flag" history indicative of complicated back pain. Of note, she works at [employer] and is on her feet a lot. Never stretches.  Review of Systems  Constitutional: Negative for chills, diaphoresis and fever.    Patient Active Problem List   Diagnosis Date Noted  . Fatty infiltration of liver 03/10/2016  . Anemia 03/10/2016  . Obesity (BMI 30.0-34.9) 01/27/2012  . Diabetes mellitus type 2, uncontrolled, without complications ([place]) 67/89/3810  . Dyslipidemia 07/02/2011    Current Outpatient Medications on File Prior to Visit  Medication Sig Dispense Refill  . azelastine (ASTELIN) 0.1 % nasal spray Place 2 sprays into both nostrils 2 (two) times daily. Use in each nostril as directed 30 mL 0  . benzonatate (TESSALON) 100 MG capsule Take 1-2 capsules (100-200 mg total) by  mouth 3 (three) times daily as needed for cough. 40 capsule 0  . gemfibrozil (LOPID) 600 MG tablet TAKE 1 TABLET(600 MG) BY MOUTH TWICE DAILY BEFORE A MEAL 180 tablet 3  . Guaifenesin (MUCINEX MAXIMUM STRENGTH) 1200 MG TB12 Take 1 tablet (1,200 mg total) by mouth every 12 (twelve) hours as needed. 14 tablet 1  . guaiFENesin-codeine (ROBITUSSIN AC) 100-10 MG/5ML syrup Take 5 mLs by mouth at bedtime as needed for cough. 60 mL 0  . insulin aspart (NOVOLOG FLEXPEN) 100 UNIT/ML FlexPen Inject 5 units into the skin before each meal 4 pen 3  . Insulin Pen Needle (B-D UF III MINI PEN NEEDLES) 31G X 5 MM MISC USE DAILY AS DIRECTED WITH INSULIN 100 each 3  . Insulin Pen Needle (B-D UF III MINI PEN NEEDLES) 31G X 5 MM MISC USE AS DIRECTED DAILY WITH INSULIN 100 each 0  . LANTUS SOLOSTAR 100 UNIT/ML Solostar Pen INJECT 50 UNITS UNDER SKIN TWICE DAILY 15 mL 0  . LANTUS SOLOSTAR 100 UNIT/ML Solostar Pen ADMINISTER 46 UNITS UNDER THE SKIN DAILY 45 mL 0  . lisinopril (PRINIVIL,ZESTRIL) 2.5 MG tablet Take 1 tablet (2.5 mg total) by mouth daily. 90 tablet 3  . metFORMIN (GLUCOPHAGE) 1000 MG tablet TAKE 1 TABLET(1000 MG) BY MOUTH TWICE DAILY WITH A MEAL 180 tablet 3  . metFORMIN (GLUCOPHAGE) 1000 MG tablet TAKE 1 TABLET BY MOUTH TWICE DAILY WITH MEALS 180 tablet 0   No current facility-administered medications on file prior to visit.     No Known Allergies   Objective:  BP 98/60 (BP Location: Left Arm, Patient Position: Sitting, Cuff Size: Large)   Pulse 87   Temp 98.2 F (36.8 C) (Oral)   Resp 18   Ht 5' 4.17" (1.63 m)   Wt 163 lb 9.6 oz (74.2 kg)   LMP  (Exact Date)   SpO2 99%   BMI 27.93 kg/m   Physical Exam  Constitutional: She is oriented to person, place, and time and well-developed, well-nourished, and in no distress.  HENT:  Head: Normocephalic and atraumatic.  Eyes: Conjunctivae are normal.  Neck: Normal range of motion.  Pulmonary/Chest: Effort normal.  Musculoskeletal:       Cervical  back: Normal.       Thoracic back: Normal.       Lumbar back: She exhibits tenderness (with palpation of bilateral musculature and with forward flexion) and spasm (bilateral lumbar musculature is tight to palpation). She exhibits normal range of motion and no bony tenderness.  Neurological: [name], place, and time. She has a normal Straight Leg Raise Test. Gait normal.  Reflex Scores:      Patellar reflexes are 2+ on the right side and 2+ on the left side.      Achilles reflexes are 2+ on the right side and 2+ on the left side. Muscular strength 5/5 for lower extreme bilaterally. Sensation of lower extremities intact bilaterally.  Skin: Skin is warm and dry.  Psychiatric: Affect normal.  Vitals reviewed.   Assessment and Plan :  1. Chronic bilateral low back pain without sciatica History and physical exam findings consistent with muscular pain.  No midline tenderness on lumbar exam.  No acute findings on neuro exam.  Recommended stretching, heating pad, and NSAIDs and muscle relaxant as needed.  Advised to return to clinic if symptoms worsen, do not improve in 2 weeks, or as needed.  If no improvement at that time, consider lumbar x-ray. - meloxicam (MOBIC) 7.5 MG tablet; Take 1 tablet (7.5 mg total) by mouth daily.  Dispense: 30 tablet; Refill: 0  2. Muscle spasm - cyclobenzaprine (FLEXERIL) 5 MG tablet; Take 1 tablet (5 mg total) by mouth 3 (three) times daily as needed for muscle spasms.  Dispense: 60 tablet; Refill: 0   Tenna Delaine PA-C  [facility] at [facility] 08/02/2017 10:58 AM

## 2017-08-02 NOTE — Patient Instructions (Addendum)
I recommend resting today. However, tomorrow I would begin walking and moving around as much as tolerated. Begin stretching in a couple of days. The worse thing you can do for low back pain is lie in bed all day or sit down all day. Use medications as needed.   Just to know, flexeril can cause side effects that may impair your thinking or reactions. Be careful if you drive or do anything that requires you to be awake and alert. void drinking alcohol, which can increase some of the side effects of Flexeril.  NSAIDs like meloxicam have common side effects of heartburn, stomach pain, indigestion, and headache. Could lead to renal insufficiency, stroke, or GI bleed if taken excess amounts outside of what is recommended on label long term.    You should avoid heavy lifting or strenuous repetitive activity to prevent recurrence of event. Experiment with both ice and heat and choose whichever feels best for you.  Use heat pad or ice pack, do not apply directly to skin, use barrier such as towel over the skin. Leave on for 15-20 minutes, 3-4 times a day.  Please perform exercises below. Stretches are to be performed for 2 sets, holding 10-15 seconds each. Recommended to perform this rehab twice daily within pain tolerance for 2 weeks.  Return to clinic if symptoms worsen, do not improve in 2 weeks, or as needed.    FLEXION RANGE OF MOTION AND STRETCHING EXERCISES: STRETCH - Flexion, Single Knee to Chest   Lie on a firm bed or floor with both legs extended in front of you.  Keeping one leg in contact with the floor, bring your opposite knee to your chest. Hold your leg in place by either grabbing behind your thigh or at your knee.  Pull until you feel a gentle stretch in your lower back.   Slowly release your grasp and repeat the exercise with the opposite side.  STRETCH - Flexion, Double Knee to Chest   Lie on a firm bed or floor with both legs extended in front of you.  Keeping one leg in  contact with the floor, bring your opposite knee to your chest.  Tense your stomach muscles to support your back and then lift your other knee to your chest. Hold your legs in place by either grabbing behind your thighs or at your knees.  Pull both knees toward your chest until you feel a gentle stretch in your lower back.   Tense your stomach muscles and slowly return one leg at a time to the floor.  STRETCH - Low Trunk Rotation  Lie on a firm bed or floor. Keeping your legs in front of you, bend your knees so they are both pointed toward the ceiling and your feet are flat on the floor.  Extend your arms out to the side. This will stabilize your upper body by keeping your shoulders in contact with the floor.  Gently and slowly drop both knees together to one side until you feel a gentle stretch in your lower back.   Tense your stomach muscles to support your lower back as you bring your knees back to the starting position. Repeat the exercise to the other side.   EXTENSION RANGE OF MOTION AND FLEXIBILITY EXERCISES: STRETCH - Extension, Prone on Elbows   Lie on your stomach on the floor, a bed will be too soft. Place your palms about shoulder width apart and at the height of your head.  Place your elbows under your  shoulders. If this is too painful, stack pillows under your chest.  Allow your body to relax so that your hips drop lower and make contact more completely with the floor.  Slowly return to lying flat on the floor.  RANGE OF MOTION - Extension, Prone Press Ups  Lie on your stomach on the floor, a bed will be too soft. Place your palms about shoulder width apart and at the height of your head.  Keeping your back as relaxed as possible, slowly straighten your elbows while keeping your hips on the floor. You may adjust the placement of your hands to maximize your comfort. As you gain motion, your hands will come more underneath your shoulders.  Slowly return to lying flat on  the floor.  RANGE OF MOTION- Quadruped, Neutral Spine   Assume a hands and knees position on a firm surface. Keep your hands under your shoulders and your knees under your hips. You may place padding under your knees for comfort.  Drop your head and point your tail bone toward the ground below you. This will round out your lower back like an angry cat.    Slowly lift your head and release your tail bone so that your back sags into a large arch, like an old horse.  Repeat this until you feel limber in your lower back.  Now, find your "sweet spot." This will be the most comfortable position somewhere between the two previous positions. This is your neutral spine. Once you have found this position, tense your stomach muscles to support your lower back.  STRENGTHENING EXERCISES - Low Back Strain These exercises may help you when beginning to rehabilitate your injury. These exercises should be done near your "sweet spot." This is the neutral, low-back arch, somewhere between fully rounded and fully arched, that is your least painful position. When performed in this safe range of motion, these exercises can be used for people who have either a flexion or extension based injury. These exercises may resolve your symptoms with or without further involvement from your physician, physical therapist or athletic trainer. While completing these exercises, remember:   Muscles can gain both the endurance and the strength needed for everyday activities through controlled exercises.  Complete these exercises as instructed by your physician, physical therapist or athletic trainer. Increase the resistance and repetitions only as guided.  You may experience muscle soreness or fatigue, but the pain or discomfort you are trying to eliminate should never worsen during these exercises. If this pain does worsen, stop and make certain you are following the directions exactly. If the pain is still present after  adjustments, discontinue the exercise until you can discuss the trouble with your caregiver.  STRENGTHENING - Deep Abdominals, Pelvic Tilt  Lie on a firm bed or floor. Keeping your legs in front of you, bend your knees so they are both pointed toward the ceiling and your feet are flat on the floor.  Tense your lower abdominal muscles to press your lower back into the floor. This motion will rotate your pelvis so that your tail bone is scooping upwards rather than pointing at your feet or into the floor.  STRENGTHENING - Abdominals, Crunches   Lie on a firm bed or floor. Keeping your legs in front of you, bend your knees so they are both pointed toward the ceiling and your feet are flat on the floor. Cross your arms over your chest.  Slightly tip your chin down without bending your neck.  Tense your abdominals and slowly lift your trunk high enough to just clear your shoulder blades. Lifting higher can put excessive stress on the lower back and does not further strengthen your abdominal muscles.  Control your return to the starting position.  STRENGTHENING - Quadruped, Opposite UE/LE Lift   Assume a hands and knees position on a firm surface. Keep your hands under your shoulders and your knees under your hips. You may place padding under your knees for comfort.  Find your neutral spine and gently tense your abdominal muscles so that you can maintain this position. Your shoulders and hips should form a rectangle that is parallel with the floor and is not twisted.  Keeping your trunk steady, lift your right hand no higher than your shoulder and then your left leg no higher than your hip. Make sure you are not holding your breath.   Continuing to keep your abdominal muscles tense and your back steady, slowly return to your starting position. Repeat with the opposite arm and leg.  STRENGTHENING - Lower Abdominals, Double Knee Lift  Lie on a firm bed or floor. Keeping your legs in front of  you, bend your knees so they are both pointed toward the ceiling and your feet are flat on the floor.  Tense your abdominal muscles to brace your lower back and slowly lift both of your knees until they come over your hips. Be certain not to hold your breath.  POSTURE AND BODY MECHANICS CONSIDERATIONS - Low Back Strain Keeping correct posture when sitting, standing or completing your activities will reduce the stress put on different body tissues, allowing injured tissues a chance to heal and limiting painful experiences. The following are general guidelines for improved posture. Your physician or physical therapist will provide you with any instructions specific to your needs. While reading these guidelines, remember:  The exercises prescribed by your provider will help you have the flexibility and strength to maintain correct postures.  The correct posture provides the best environment for your joints to work. All of your joints have less wear and tear when properly supported by a spine with good posture. This means you will experience a healthier, less painful body.  Correct posture must be practiced with all of your activities, especially prolonged sitting and standing. Correct posture is as important when doing repetitive low-stress activities (typing) as it is when doing a single heavy-load activity (lifting). RESTING POSITIONS Consider which positions are most painful for you when choosing a resting position. If you have pain with flexion-based activities (sitting, bending, stooping, squatting), choose a position that allows you to rest in a less flexed posture. You would want to avoid curling into a fetal position on your side. If your pain worsens with extension-based activities (prolonged standing, working overhead), avoid resting in an extended position such as sleeping on your stomach. Most people will find more comfort when they rest with their spine in a more neutral position, neither too  rounded nor too arched. Lying on a non-sagging bed on your side with a pillow between your knees, or on your back with a pillow under your knees will often provide some relief. Keep in mind, being in any one position for a prolonged period of time, no matter how correct your posture, can still lead to stiffness. PROPER SITTING POSTURE In order to minimize stress and discomfort on your spine, you must sit with correct posture. Sitting with good posture should be effortless for a healthy body. Returning to good  posture is a gradual process. Many people can work toward this most comfortably by using various supports until they have the flexibility and strength to maintain this posture on their own. When sitting with proper posture, your ears will fall over your shoulders and your shoulders will fall over your hips. You should use the back of the chair to support your upper back. Your lower back will be in a neutral position, just slightly arched. You may place a small pillow or folded towel at the base of your lower back for support.  When working at a desk, create an environment that supports good, upright posture. Without extra support, muscles tire, which leads to excessive strain on joints and other tissues. Keep these recommendations in mind: CHAIR:  A chair should be able to slide under your desk when your back makes contact with the back of the chair. This allows you to work closely.  The chair's height should allow your eyes to be level with the upper part of your monitor and your hands to be slightly lower than your elbows. BODY POSITION  Your feet should make contact with the floor. If this is not possible, use a foot rest.  Keep your ears over your shoulders. This will reduce stress on your neck and lower back. INCORRECT SITTING POSTURES  If you are feeling tired and unable to assume a healthy sitting posture, do not slouch or slump. This puts excessive strain on your back tissues, causing  more damage and pain. Healthier options include:  Using more support, like a lumbar pillow.  Switching tasks to something that requires you to be upright or walking.  Talking a brief walk.  Lying down to rest in a neutral-spine position. PROLONGED STANDING WHILE SLIGHTLY LEANING FORWARD  When completing a task that requires you to lean forward while standing in one place for a long time, place either foot up on a stationary 2-4 inch high object to help maintain the best posture. When both feet are on the ground, the lower back tends to lose its slight inward curve. If this curve flattens (or becomes too large), then the back and your other joints will experience too much stress, tire more quickly, and can cause pain. CORRECT STANDING POSTURES Proper standing posture should be assumed with all daily activities, even if they only take a few moments, like when brushing your teeth. As in sitting, your ears should fall over your shoulders and your shoulders should fall over your hips. You should keep a slight tension in your abdominal muscles to brace your spine. Your tailbone should point down to the ground, not behind your body, resulting in an over-extended swayback posture.  INCORRECT STANDING POSTURES  Common incorrect standing postures include a forward head, locked knees and/or an excessive swayback. WALKING Walk with an upright posture. Your ears, shoulders and hips should all line-up. PROLONGED ACTIVITY IN A FLEXED POSITION When completing a task that requires you to bend forward at your waist or lean over a low surface, try to find a way to stabilize 3 out of 4 of your limbs. You can place a hand or elbow on your thigh or rest a knee on the surface you are reaching across. This will provide you more stability so that your muscles do not fatigue as quickly. By keeping your knees relaxed, or slightly bent, you will also reduce stress across your lower back. CORRECT LIFTING TECHNIQUES DO :    Assume a wide stance. This will provide you  more stability and the opportunity to get as close as possible to the object which you are lifting.  Tense your abdominals to brace your spine. Bend at the knees and hips. Keeping your back locked in a neutral-spine position, lift using your leg muscles. Lift with your legs, keeping your back straight.  Test the weight of unknown objects before attempting to lift them.  Try to keep your elbows locked down at your sides in order get the best strength from your shoulders when carrying an object.  Always ask for help when lifting heavy or awkward objects. INCORRECT LIFTING TECHNIQUES DO NOT:   Lock your knees when lifting, even if it is a small object.  Bend and twist. Pivot at your feet or move your feet when needing to change directions.  Assume that you can safely pick up even a paper clip without proper posture.       IF you received an x-ray today, you will receive an invoice from [facility]. Please contact [facility] at [phone] with questions or concerns regarding your invoice.   IF you received labwork today, you will receive an invoice from [place]. Please contact [facility] at [phone] with questions or concerns regarding your invoice.   Our billing staff will not be able to assist you with questions regarding bills from these companies.  You will be contacted with the lab results as soon as they are available. The fastest way to get your results is to activate your My Chart account. Instructions are located on the last page of this paperwork. If you have not heard from [place] regarding the results in 2 weeks, please contact this office.    We recommend that you schedule a mammogram for breast cancer screening. Typically, you do not need a referral to do this. Please contact a local imaging center to schedule your mammogram.  [facility] - [phone]  *ask for the Radiology Department The  [facility] ([place]) - [phone] or [phone]  [facility] - [phone] [place] [phone] [facility] - [phone]  *ask for the [facility] - [phone]  *ask for the Radiology Department [facility] - [phone]  *ask for the [place] - [phone]

## 2017-08-02 NOTE — Progress Notes (Deleted)
   Shelley Escobar  MRN: [id number] DOB: 02-Oct-1970  Subjective:  Shelley Escobar is a 47 y.o. female seen in office today for a chief complaint of ***  Review of Systems  Patient Active Problem List   Diagnosis Date Noted  . Fatty infiltration of liver 03/10/2016  . Anemia 03/10/2016  . Obesity (BMI 30.0-34.9) 01/27/2012  . Diabetes mellitus type 2, uncontrolled, without complications ([place]) 75/03/2584  . Dyslipidemia 07/02/2011    Current Outpatient Medications on File Prior to Visit  Medication Sig Dispense Refill  . azelastine (ASTELIN) 0.1 % nasal spray Place 2 sprays into both nostrils 2 (two) times daily. Use in each nostril as directed 30 mL 0  . benzonatate (TESSALON) 100 MG capsule Take 1-2 capsules (100-200 mg total) by mouth 3 (three) times daily as needed for cough. 40 capsule 0  . gemfibrozil (LOPID) 600 MG tablet TAKE 1 TABLET(600 MG) BY MOUTH TWICE DAILY BEFORE A MEAL 180 tablet 3  . Guaifenesin (MUCINEX MAXIMUM STRENGTH) 1200 MG TB12 Take 1 tablet (1,200 mg total) by mouth every 12 (twelve) hours as needed. 14 tablet 1  . guaiFENesin-codeine (ROBITUSSIN AC) 100-10 MG/5ML syrup Take 5 mLs by mouth at bedtime as needed for cough. 60 mL 0  . insulin aspart (NOVOLOG FLEXPEN) 100 UNIT/ML FlexPen Inject 5 units into the skin before each meal 4 pen 3  . Insulin Pen Needle (B-D UF III MINI PEN NEEDLES) 31G X 5 MM MISC USE DAILY AS DIRECTED WITH INSULIN 100 each 3  . Insulin Pen Needle (B-D UF III MINI PEN NEEDLES) 31G X 5 MM MISC USE AS DIRECTED DAILY WITH INSULIN 100 each 0  . LANTUS SOLOSTAR 100 UNIT/ML Solostar Pen INJECT 50 UNITS UNDER SKIN TWICE DAILY 15 mL 0  . LANTUS SOLOSTAR 100 UNIT/ML Solostar Pen ADMINISTER 46 UNITS UNDER THE SKIN DAILY 45 mL 0  . lisinopril (PRINIVIL,ZESTRIL) 2.5 MG tablet Take 1 tablet (2.5 mg total) by mouth daily. 90 tablet 3  . metFORMIN (GLUCOPHAGE) 1000 MG tablet TAKE 1 TABLET(1000 MG) BY MOUTH TWICE DAILY WITH A MEAL 180 tablet 3  . metFORMIN  (GLUCOPHAGE) 1000 MG tablet TAKE 1 TABLET BY MOUTH TWICE DAILY WITH MEALS 180 tablet 0   No current facility-administered medications on file prior to visit.     No Known Allergies   Objective:  There were no vitals taken for this visit.  Physical Exam  Assessment and Plan :  *** There are no diagnoses linked to this encounter.   Tenna Delaine PA-C  [facility] 08/02/2017 10:31 AM

## 2017-08-30 ENCOUNTER — Ambulatory Visit: Payer: Self-pay

## 2017-09-01 ENCOUNTER — Other Ambulatory Visit: Payer: Self-pay | Admitting: Physician Assistant

## 2017-09-01 DIAGNOSIS — M545 Low back pain: Principal | ICD-10-CM

## 2017-09-01 DIAGNOSIS — G8929 Other chronic pain: Secondary | ICD-10-CM

## 2017-09-01 NOTE — Telephone Encounter (Signed)
Meloxicam refill Last OV: 08/02/17 Last Refill:08/02/17 Pharmacy:[facility] [place] @ [address]

## 2017-09-06 ENCOUNTER — Other Ambulatory Visit: Payer: Self-pay

## 2017-09-06 ENCOUNTER — Ambulatory Visit: Payer: BLUE CROSS/BLUE SHIELD | Admitting: Physician Assistant

## 2017-09-06 ENCOUNTER — Encounter: Payer: Self-pay | Admitting: Physician Assistant

## 2017-09-06 VITALS — BP 113/74 | HR 105 | Temp 99.1°F | Resp 16 | Ht 64.17 in | Wt 162.0 lb

## 2017-09-06 DIAGNOSIS — M62838 Other muscle spasm: Secondary | ICD-10-CM

## 2017-09-06 DIAGNOSIS — M545 Low back pain, unspecified: Secondary | ICD-10-CM

## 2017-09-06 DIAGNOSIS — E1165 Type 2 diabetes mellitus with hyperglycemia: Secondary | ICD-10-CM

## 2017-09-06 DIAGNOSIS — IMO0001 Reserved for inherently not codable concepts without codable children: Secondary | ICD-10-CM

## 2017-09-06 DIAGNOSIS — R143 Flatulence: Secondary | ICD-10-CM

## 2017-09-06 DIAGNOSIS — R739 Hyperglycemia, unspecified: Secondary | ICD-10-CM

## 2017-09-06 DIAGNOSIS — R142 Eructation: Secondary | ICD-10-CM | POA: Diagnosis not present

## 2017-09-06 DIAGNOSIS — G8929 Other chronic pain: Secondary | ICD-10-CM | POA: Diagnosis not present

## 2017-09-06 DIAGNOSIS — R141 Gas pain: Secondary | ICD-10-CM

## 2017-09-06 LAB — POCT URINALYSIS DIP (MANUAL ENTRY)
Bilirubin, UA: NEGATIVE
Blood, UA: NEGATIVE
GLUCOSE UA: NEGATIVE mg/dL
Ketones, POC UA: NEGATIVE mg/dL
Leukocytes, UA: NEGATIVE
Nitrite, UA: NEGATIVE
Protein Ur, POC: NEGATIVE mg/dL
SPEC GRAV UA: 1.025 (ref 1.010–1.025)
UROBILINOGEN UA: 0.2 U/dL
pH, UA: 5.5 (ref 5.0–8.0)

## 2017-09-06 LAB — GLUCOSE, POCT (MANUAL RESULT ENTRY): POC Glucose: 175 mg/dl — AB (ref 70–99)

## 2017-09-06 MED ORDER — CYCLOBENZAPRINE HCL 5 MG PO TABS: 5.0000 mg | ORAL_TABLET | Freq: Three times a day (TID) | ORAL | 0 refills | Status: DC | PRN

## 2017-09-06 NOTE — Progress Notes (Signed)
b    MRN: [id number]  Subjective:   Shelley Escobar is a 47 y.o. female who presents for gas x 3 days (~10-20 x a day). Has some belching and abdominal fullness and decreased appetite. Had diarrhea last week but that stopped.   Last normal BM was this morning. Denies abdominal pain, hematochezia, nausea, vomiting, weight loss, fever, and chills. Has not tried anything for relief. Others at home are feeling the same way. Did try new asian food high in starch before this all started. Eats rice, veggies, and soups. She is drinking lots of water. Has not tried anything for relief.  Has PMH of T2DM. In terms of T2DM, son notes he has to call for endocrinology appointment but has not done so yet. Last A1C 2 months ago was 10.4. Checked her sugar this morning and it was 180. Was treated for yeast a few days ago and took two tablets of diflucan, no other new medications.   Still having occasioanal back pain would like refill for muscle relaxer. Has been trying to increase stretching but not doing so daily.  Symptoms are exacerbated by bending over and long periods of standing.. She denies acute injury, heavy lifting, weakness in the right leg, weakness in the left leg, tingling in the right leg, tingling in the left leg, burning pain in the right leg, burning pain in the left leg, urinary hesitancy, urinary incontinence, urinary retention, bowel incontinence, constipation, impotence and groin/perineal numbness associated with the back pain.  Review of Systems  Constitutional: Negative for fever.  Respiratory: Negative for cough.   Cardiovascular: Negative for chest pain and palpitations.  Gastrointestinal: Negative for heartburn and melena.  Genitourinary: Negative for dysuria, frequency, hematuria and urgency.  Neurological: Negative for dizziness and weakness.  Psychiatric/Behavioral: Negative for depression.       Family History  Adopted: Yes  Family history unknown: Yes   Social History    Socioeconomic History  . Marital status: Married    Spouse name: [name]  . Number of children: 3  . Years of education: none  . Highest education level: Not on file  Occupational History  . 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 Financial resource strain: Not on file  . Food insecurity:    Worry: Not on file    Inability: Not on file  . Transportation needs:    Medical: Not on file    Non-medical: Not on file  Tobacco Use  . Smoking status: Never Smoker  . Smokeless tobacco: Never Used  Substance and Sexual Activity  . Alcohol use: No    Alcohol/week: 0.0 oz  . Drug use: No  . Sexual activity: Yes    Partners: Male    Birth control/protection: Surgical    Comment: Tubal  Lifestyle  . Physical activity:    Days per week: Not on file    Minutes per session: Not on file  . Stress: Not on file  Relationships  . Social connections:    Talks on phone: Not on file    Gets together: Not on file    Attends religious service: Not on file    Active member of club or organization: Not on file    Attends meetings of clubs or organizations: Not on file    Relationship status: Not on file  . Intimate partner violence:    Fear of current or ex partner: Not on  file    Emotionally abused: Not on file    Physically abused: Not on file    Forced sexual activity: Not on file  Other Topics Concern  . 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      Married in 1983 and has 3 grown children, 2 boys and one girl.       Lives with husband and two boys. Daughter lives locally.   Smoke detectors in home? Yes    Guns in home? No    Consistent seatbelt use? Yes    Consistent dental brushing and flossing? Yes    Semi-Annual dental visits: No    Annual Eye exams: No         Objective:   PHYSICAL EXAM BP 113/74  (BP Location: Left Arm, Patient Position: Sitting, Cuff Size: Normal)   Pulse (!) 105   Temp 99.1 F (37.3 C) (Oral)   Resp 16   Ht 5' 4.17" (1.63 m)   Wt 162 lb (73.5 kg)   SpO2 97%   BMI 27.66 kg/m   Physical Exam  Constitutional: She is oriented to person, place, and time. She appears well-developed and well-nourished.  HENT:  Head: Normocephalic and atraumatic.  Eyes: Conjunctivae are normal.  Neck: Normal range of motion.  Cardiovascular: Normal rate, regular rhythm, normal heart sounds and intact distal pulses.  Pulmonary/Chest: Effort normal.  Abdominal: Soft. Normal appearance and bowel sounds are normal. There is no tenderness. There is no rigidity, no guarding, no tenderness at McBurney's point and negative Murphy's sign.  Musculoskeletal:       Thoracic back: She exhibits tenderness (with palpation of bilateral musculature ). She exhibits no bony tenderness.       Lumbar back: She exhibits tenderness (with palpation of bilateral musculature ). She exhibits normal range of motion and no bony tenderness.  Neurological: [name], place, and time.  Reflex Scores:      Patellar reflexes are 2+ on the right side and 2+ on the left side.      Achilles reflexes are 2+ on the right side and 2+ on the left side. Muscular strength 5/5 bilateral lower extremities.   Sensation of bilateral lower extremities intact.  Normal SLR bilaterally.  Skin: Skin is warm and dry.  Psychiatric: She has a normal mood and affect.  Vitals reviewed.   Wt Readings from Last 3 Encounters:  09/06/17 162 lb (73.5 kg)  08/02/17 163 lb 9.6 oz (74.2 kg)  07/24/17 161 lb (73 kg)   Results for orders placed or performed in visit on 09/06/17 (from the past 72 hour(s))  POCT glucose (manual entry)     Status: Abnormal   Collection Time: 09/06/17  3:47 PM  Result Value Ref Range   POC Glucose 175 (A) 70 - 99 mg/dl  CBC With Differential     Status: Abnormal   Collection  Time: 09/06/17  4:07 PM  Result Value Ref Range   WBC 11.9 (H) 3.4 - 10.8 x10E3/uL   RBC 4.35 3.77 - 5.28 x10E6/uL   Hemoglobin 9.8 (L) 11.1 - 15.9 g/dL   Hematocrit 31.5 (L) 34.0 - 46.6 %   MCV 72 (L) 79 - 97 fL   MCH 22.5 (L) 26.6 - 33.0 pg   MCHC 31.1 (L) 31.5 - 35.7 g/dL   RDW 16.5 (H) 12.3 - 15.4 %  Neutrophils 71 Not Estab. %   Lymphs 17 Not Estab. %   Monocytes 6 Not Estab. %   Eos 6 Not Estab. %   Basos 0 Not Estab. %   Neutrophils Absolute 8.3 (H) 1.4 - 7.0 x10E3/uL   Lymphocytes Absolute 2.1 0.7 - 3.1 x10E3/uL   Monocytes Absolute 0.8 0.1 - 0.9 x10E3/uL   EOS (ABSOLUTE) 0.8 (H) 0.0 - 0.4 x10E3/uL   Basophils Absolute 0.0 0.0 - 0.2 x10E3/uL   Immature Granulocytes 0 Not Estab. %   Immature Grans (Abs) 0.0 0.0 - 0.1 x10E3/uL  POCT urinalysis dipstick     Status: Normal   Collection Time: 09/06/17  4:15 PM  Result Value Ref Range   Color, UA yellow yellow   Clarity, UA clear clear   Glucose, UA negative negative mg/dL   Bilirubin, UA negative negative   Ketones, POC UA negative negative mg/dL   Spec Grav, UA 1.025 1.010 - 1.025   Blood, UA negative negative   pH, UA 5.5 5.0 - 8.0   Protein Ur, POC negative negative mg/dL   Urobilinogen, UA 0.2 0.2 or 1.0 E.U./dL   Nitrite, UA Negative Negative   Leukocytes, UA Negative Negative     Assessment and Plan :  1. Flatulence, eructation and gas pain Likely related to diet. She is overall well-appearing.  No distress No abdominal pain noted on exam.  Recommended monitoring diet for foods and increased gas.  Given list of foods that she should avoid.  Can also use over-the-counter Gas-X or Beano.  Follow-up if symptoms worsen, do not improve, or if she develops new concerning symptoms.  2. Hyperglycemia Point-of-care glucose 175.  UA normal.  Asymptomatic.  Strongly encouraged son to contact endocrinologist to schedule appointment.  He agrees to do so this week. - POCT urinalysis dipstick 3. Diabetes mellitus type 2,  uncontrolled, without complications (HCC) - POCT glucose (manual entry) - CBC With Differential  4. Muscle spasm 5. Chronic bilateral low back pain without sciatica Will provide patient with additional refill of Flexeril.  No acute findings on neuro exam.  Educated patient that she needs to be stretching every day.  If no improvement with stretching and pain persists, patient educated to return to office.  Recommend obtaining lumbar plain film at that time.  - cyclobenzaprine (FLEXERIL) 5 MG tablet; Take 1 tablet (5 mg total) by mouth 3 (three) times daily as needed for muscle spasms.  Dispense: 60 tablet; Refill: 0  Tenna Delaine, PA-C  [facility] 09/06/2017 4:04 PM

## 2017-09-06 NOTE — Patient Instructions (Addendum)
Below is some info about gas. Monitor your intake of foods that increase gas (eg, beans, onions, celery, carrots, raisins, bananas, apricots, prunes, Brussels sprouts, wheat germ, pretzels, and bagels). If your symptoms persist despite monitoring these foods and with over the counter medication or any thing worsens, please return. If you develop any concerning sx like the ones below also return. Please make follow up with your endocrinologist as soon as possible.  ?Nocturnal abdominal pain ?Weight loss ?Hematochezia ?Systemic symptoms including weight loss or fever ?Diarrhea or steatorrhea ?Vomiting ?Severe abdominal tenderness, organomegaly, or succussion splash on physical examination   Abdominal Bloating When you have abdominal bloating, your abdomen may feel full, tight, or painful. It may also look bigger than normal or swollen (distended). Common causes of abdominal bloating include:  Swallowing air.  Constipation.  Problems digesting food.  Eating too much.  Irritable bowel syndrome. This is a condition that affects the large intestine.  Lactose intolerance. This is an inability to digest lactose, a natural sugar in dairy products.  Celiac disease. This is a condition that affects the ability to digest gluten, a protein found in some grains.  Gastroparesis. This is a condition that slows down the movement of food in the stomach and small intestine. It is more common in people with diabetes mellitus.  Gastroesophageal reflux disease (GERD). This is a digestive condition that makes stomach acid flow back into the esophagus.  Urinary retention. This means that the body is holding onto urine, and the bladder cannot be emptied all the way.  Follow these instructions at home: Eating and drinking  Avoid eating too much.  Try not to swallow air while talking or eating.  Avoid eating while lying down.  Avoid these foods and drinks: ? Foods that cause gas, such as broccoli,  cabbage, cauliflower, and baked beans. ? Carbonated drinks. ? Hard candy. ? Chewing gum. Medicines  Take over-the-counter and prescription medicines only as told by your health care provider.  Take probiotic medicines. These medicines contain live bacteria or yeasts that can help digestion.  Take coated peppermint oil capsules. Activity  Try to exercise regularly. Exercise may help to relieve bloating that is caused by gas and relieve constipation. General instructions  Keep all follow-up visits as told by your health care provider. This is important. Contact a health care provider if:  You have nausea and vomiting.  You have diarrhea.  You have abdominal pain.  You have unusual weight loss or weight gain.  You have severe pain, and medicines do not help. Get help right away if:  You have severe chest pain.  You have trouble breathing.  You have shortness of breath.  You have trouble urinating.  You have darker urine than normal.  You have blood in your stools or have dark, tarry stools. Summary  Abdominal bloating means that the abdomen is swollen.  Common causes of abdominal bloating are swallowing air, constipation, and problems digesting food.  Avoid eating too much and avoid swallowing air.  Avoid foods that cause gas, carbonated drinks, hard candy, and chewing gum. This information is not intended to replace advice given to you by your health care provider. Make sure you discuss any questions you have with your health care provider. Document Released: 07/08/2016 Document Revised: 07/08/2016 Document Reviewed: 07/08/2016 Elsevier Interactive Patient Education  [employer].    For back, start stretching daily. You can apply heat to affected area as well.   Just to know, flexeril can cause side  effects that may impair your thinking or reactions. Be careful if you drive or do anything that requires you to be awake and alert. void drinking alcohol,  which can increase some of the side effects of Flexeril.  Please perform exercises below. Stretches are to be performed for 2 sets, holding 10-15 seconds each. Recommended to perform this rehab twice daily within pain tolerance for 2 weeks.   FLEXION RANGE OF MOTION AND STRETCHING EXERCISES: STRETCH - Flexion, Single Knee to Chest   Lie on a firm bed or floor with both legs extended in front of you.  Keeping one leg in contact with the floor, bring your opposite knee to your chest. Hold your leg in place by either grabbing behind your thigh or at your knee.  Pull until you feel a gentle stretch in your lower back.   Slowly release your grasp and repeat the exercise with the opposite side.  STRETCH - Flexion, Double Knee to Chest   Lie on a firm bed or floor with both legs extended in front of you.  Keeping one leg in contact with the floor, bring your opposite knee to your chest.  Tense your stomach muscles to support your back and then lift your other knee to your chest. Hold your legs in place by either grabbing behind your thighs or at your knees.  Pull both knees toward your chest until you feel a gentle stretch in your lower back.   Tense your stomach muscles and slowly return one leg at a time to the floor.  STRETCH - Low Trunk Rotation  Lie on a firm bed or floor. Keeping your legs in front of you, bend your knees so they are both pointed toward the ceiling and your feet are flat on the floor.  Extend your arms out to the side. This will stabilize your upper body by keeping your shoulders in contact with the floor.  Gently and slowly drop both knees together to one side until you feel a gentle stretch in your lower back.   Tense your stomach muscles to support your lower back as you bring your knees back to the starting position. Repeat the exercise to the other side.   EXTENSION RANGE OF MOTION AND FLEXIBILITY EXERCISES: STRETCH - Extension, Prone on Elbows   Lie on  your stomach on the floor, a bed will be too soft. Place your palms about shoulder width apart and at the height of your head.  Place your elbows under your shoulders. If this is too painful, stack pillows under your chest.  Allow your body to relax so that your hips drop lower and make contact more completely with the floor.  Slowly return to lying flat on the floor.  RANGE OF MOTION - Extension, Prone Press Ups  Lie on your stomach on the floor, a bed will be too soft. Place your palms about shoulder width apart and at the height of your head.  Keeping your back as relaxed as possible, slowly straighten your elbows while keeping your hips on the floor. You may adjust the placement of your hands to maximize your comfort. As you gain motion, your hands will come more underneath your shoulders.  Slowly return to lying flat on the floor.  RANGE OF MOTION- Quadruped, Neutral Spine   Assume a hands and knees position on a firm surface. Keep your hands under your shoulders and your knees under your hips. You may place padding under your knees for comfort.  Drop  your head and point your tail bone toward the ground below you. This will round out your lower back like an angry cat.    Slowly lift your head and release your tail bone so that your back sags into a large arch, like an old horse.  Repeat this until you feel limber in your lower back.  Now, find your "sweet spot." This will be the most comfortable position somewhere between the two previous positions. This is your neutral spine. Once you have found this position, tense your stomach muscles to support your lower back.  STRENGTHENING EXERCISES - Low Back Strain These exercises may help you when beginning to rehabilitate your injury. These exercises should be done near your "sweet spot." This is the neutral, low-back arch, somewhere between fully rounded and fully arched, that is your least painful position. When performed in this safe  range of motion, these exercises can be used for people who have either a flexion or extension based injury. These exercises may resolve your symptoms with or without further involvement from your physician, physical therapist or athletic trainer. While completing these exercises, remember:   Muscles can gain both the endurance and the strength needed for everyday activities through controlled exercises.  Complete these exercises as instructed by your physician, physical therapist or athletic trainer. Increase the resistance and repetitions only as guided.  You may experience muscle soreness or fatigue, but the pain or discomfort you are trying to eliminate should never worsen during these exercises. If this pain does worsen, stop and make certain you are following the directions exactly. If the pain is still present after adjustments, discontinue the exercise until you can discuss the trouble with your caregiver.  STRENGTHENING - Deep Abdominals, Pelvic Tilt  Lie on a firm bed or floor. Keeping your legs in front of you, bend your knees so they are both pointed toward the ceiling and your feet are flat on the floor.  Tense your lower abdominal muscles to press your lower back into the floor. This motion will rotate your pelvis so that your tail bone is scooping upwards rather than pointing at your feet or into the floor.  STRENGTHENING - Abdominals, Crunches   Lie on a firm bed or floor. Keeping your legs in front of you, bend your knees so they are both pointed toward the ceiling and your feet are flat on the floor. Cross your arms over your chest.  Slightly tip your chin down without bending your neck.  Tense your abdominals and slowly lift your trunk high enough to just clear your shoulder blades. Lifting higher can put excessive stress on the lower back and does not further strengthen your abdominal muscles.  Control your return to the starting position.  STRENGTHENING - Quadruped,  Opposite UE/LE Lift   Assume a hands and knees position on a firm surface. Keep your hands under your shoulders and your knees under your hips. You may place padding under your knees for comfort.  Find your neutral spine and gently tense your abdominal muscles so that you can maintain this position. Your shoulders and hips should form a rectangle that is parallel with the floor and is not twisted.  Keeping your trunk steady, lift your right hand no higher than your shoulder and then your left leg no higher than your hip. Make sure you are not holding your breath.   Continuing to keep your abdominal muscles tense and your back steady, slowly return to your starting position. Repeat with  the opposite arm and leg.  STRENGTHENING - Lower Abdominals, Double Knee Lift  Lie on a firm bed or floor. Keeping your legs in front of you, bend your knees so they are both pointed toward the ceiling and your feet are flat on the floor.  Tense your abdominal muscles to brace your lower back and slowly lift both of your knees until they come over your hips. Be certain not to hold your breath.  POSTURE AND BODY MECHANICS CONSIDERATIONS - Low Back Strain Keeping correct posture when sitting, standing or completing your activities will reduce the stress put on different body tissues, allowing injured tissues a chance to heal and limiting painful experiences. The following are general guidelines for improved posture. Your physician or physical therapist will provide you with any instructions specific to your needs. While reading these guidelines, remember:  The exercises prescribed by your provider will help you have the flexibility and strength to maintain correct postures.  The correct posture provides the best environment for your joints to work. All of your joints have less wear and tear when properly supported by a spine with good posture. This means you will experience a healthier, less painful body.  Correct  posture must be practiced with all of your activities, especially prolonged sitting and standing. Correct posture is as important when doing repetitive low-stress activities (typing) as it is when doing a single heavy-load activity (lifting). RESTING POSITIONS Consider which positions are most painful for you when choosing a resting position. If you have pain with flexion-based activities (sitting, bending, stooping, squatting), choose a position that allows you to rest in a less flexed posture. You would want to avoid curling into a fetal position on your side. If your pain worsens with extension-based activities (prolonged standing, working overhead), avoid resting in an extended position such as sleeping on your stomach. Most people will find more comfort when they rest with their spine in a more neutral position, neither too rounded nor too arched. Lying on a non-sagging bed on your side with a pillow between your knees, or on your back with a pillow under your knees will often provide some relief. Keep in mind, being in any one position for a prolonged period of time, no matter how correct your posture, can still lead to stiffness. PROPER SITTING POSTURE In order to minimize stress and discomfort on your spine, you must sit with correct posture. Sitting with good posture should be effortless for a healthy body. Returning to good posture is a gradual process. Many people can work toward this most comfortably by using various supports until they have the flexibility and strength to maintain this posture on their own. When sitting with proper posture, your ears will fall over your shoulders and your shoulders will fall over your hips. You should use the back of the chair to support your upper back. Your lower back will be in a neutral position, just slightly arched. You may place a small pillow or folded towel at the base of your lower back for support.  When working at a desk, create an environment that  supports good, upright posture. Without extra support, muscles tire, which leads to excessive strain on joints and other tissues. Keep these recommendations in mind: CHAIR:  A chair should be able to slide under your desk when your back makes contact with the back of the chair. This allows you to work closely.  The chair's height should allow your eyes to be level with the upper  part of your monitor and your hands to be slightly lower than your elbows. BODY POSITION  Your feet should make contact with the floor. If this is not possible, use a foot rest.  Keep your ears over your shoulders. This will reduce stress on your neck and lower back. INCORRECT SITTING POSTURES  If you are feeling tired and unable to assume a healthy sitting posture, do not slouch or slump. This puts excessive strain on your back tissues, causing more damage and pain. Healthier options include:  Using more support, like a lumbar pillow.  Switching tasks to something that requires you to be upright or walking.  Talking a brief walk.  Lying down to rest in a neutral-spine position. PROLONGED STANDING WHILE SLIGHTLY LEANING FORWARD  When completing a task that requires you to lean forward while standing in one place for a long time, place either foot up on a stationary 2-4 inch high object to help maintain the best posture. When both feet are on the ground, the lower back tends to lose its slight inward curve. If this curve flattens (or becomes too large), then the back and your other joints will experience too much stress, tire more quickly, and can cause pain. CORRECT STANDING POSTURES Proper standing posture should be assumed with all daily activities, even if they only take a few moments, like when brushing your teeth. As in sitting, your ears should fall over your shoulders and your shoulders should fall over your hips. You should keep a slight tension in your abdominal muscles to brace your spine. Your tailbone  should point down to the ground, not behind your body, resulting in an over-extended swayback posture.  INCORRECT STANDING POSTURES  Common incorrect standing postures include a forward head, locked knees and/or an excessive swayback. WALKING Walk with an upright posture. Your ears, shoulders and hips should all line-up. PROLONGED ACTIVITY IN A FLEXED POSITION When completing a task that requires you to bend forward at your waist or lean over a low surface, try to find a way to stabilize 3 out of 4 of your limbs. You can place a hand or elbow on your thigh or rest a knee on the surface you are reaching across. This will provide you more stability so that your muscles do not fatigue as quickly. By keeping your knees relaxed, or slightly bent, you will also reduce stress across your lower back. CORRECT LIFTING TECHNIQUES DO :   Assume a wide stance. This will provide you more stability and the opportunity to get as close as possible to the object which you are lifting.  Tense your abdominals to brace your spine. Bend at the knees and hips. Keeping your back locked in a neutral-spine position, lift using your leg muscles. Lift with your legs, keeping your back straight.  Test the weight of unknown objects before attempting to lift them.  Try to keep your elbows locked down at your sides in order get the best strength from your shoulders when carrying an object.  Always ask for help when lifting heavy or awkward objects. INCORRECT LIFTING TECHNIQUES DO NOT:   Lock your knees when lifting, even if it is a small object.  Bend and twist. Pivot at your feet or move your feet when needing to change directions.  Assume that you can safely pick up even a paper clip without proper posture.     IF you received an x-ray today, you will receive an invoice from [facility]. Please contact [facility]  [facility] at [phone] with questions or concerns regarding your invoice.   IF you  received labwork today, you will receive an invoice from [place]. Please contact [facility] at [phone] with questions or concerns regarding your invoice.   Our billing staff will not be able to assist you with questions regarding bills from these companies.  You will be contacted with the lab results as soon as they are available. The fastest way to get your results is to activate your My Chart account. Instructions are located on the last page of this paperwork. If you have not heard from [place] regarding the results in 2 weeks, please contact this office.

## 2017-09-07 LAB — CBC WITH DIFFERENTIAL
BASOS: 0 %
Basophils Absolute: 0 10*3/uL (ref 0.0–0.2)
EOS (ABSOLUTE): 0.8 10*3/uL — ABNORMAL HIGH (ref 0.0–0.4)
EOS: 6 %
HEMATOCRIT: 31.5 % — AB (ref 34.0–46.6)
HEMOGLOBIN: 9.8 g/dL — AB (ref 11.1–15.9)
IMMATURE GRANS (ABS): 0 10*3/uL (ref 0.0–0.1)
Immature Granulocytes: 0 %
Lymphocytes Absolute: 2.1 10*3/uL (ref 0.7–3.1)
Lymphs: 17 %
MCH: 22.5 pg — ABNORMAL LOW (ref 26.6–33.0)
MCHC: 31.1 g/dL — ABNORMAL LOW (ref 31.5–35.7)
MCV: 72 fL — AB (ref 79–97)
MONOCYTES: 6 %
MONOS ABS: 0.8 10*3/uL (ref 0.1–0.9)
Neutrophils Absolute: 8.3 10*3/uL — ABNORMAL HIGH (ref 1.4–7.0)
Neutrophils: 71 %
RBC: 4.35 x10E6/uL (ref 3.77–5.28)
RDW: 16.5 % — ABNORMAL HIGH (ref 12.3–15.4)
WBC: 11.9 10*3/uL — AB (ref 3.4–10.8)

## 2017-09-09 ENCOUNTER — Encounter: Payer: Self-pay | Admitting: Physician Assistant

## 2017-09-18 ENCOUNTER — Ambulatory Visit
Admission: RE | Admit: 2017-09-18 | Discharge: 2017-09-18 | Disposition: A | Discharge status: Home / Self Care | Payer: BLUE CROSS/BLUE SHIELD | Source: Ambulatory Visit | Attending: Physician Assistant | Admitting: Physician Assistant

## 2017-09-18 DIAGNOSIS — Z1231 Encounter for screening mammogram for malignant neoplasm of breast: Secondary | ICD-10-CM

## 2017-09-19 ENCOUNTER — Ambulatory Visit: Payer: BLUE CROSS/BLUE SHIELD | Admitting: Physician Assistant

## 2017-09-19 ENCOUNTER — Encounter: Payer: Self-pay | Admitting: Physician Assistant

## 2017-09-19 ENCOUNTER — Other Ambulatory Visit: Payer: Self-pay

## 2017-09-19 VITALS — BP 102/62 | HR 94 | Temp 98.8°F | Resp 16 | Ht 64.17 in | Wt 163.6 lb

## 2017-09-19 DIAGNOSIS — Z794 Long term (current) use of insulin: Secondary | ICD-10-CM

## 2017-09-19 DIAGNOSIS — Z23 Encounter for immunization: Secondary | ICD-10-CM

## 2017-09-19 DIAGNOSIS — E785 Hyperlipidemia, unspecified: Secondary | ICD-10-CM

## 2017-09-19 DIAGNOSIS — E1165 Type 2 diabetes mellitus with hyperglycemia: Secondary | ICD-10-CM

## 2017-09-19 DIAGNOSIS — D649 Anemia, unspecified: Secondary | ICD-10-CM

## 2017-09-19 DIAGNOSIS — E669 Obesity, unspecified: Secondary | ICD-10-CM | POA: Diagnosis not present

## 2017-09-19 DIAGNOSIS — IMO0001 Reserved for inherently not codable concepts without codable children: Secondary | ICD-10-CM

## 2017-09-19 MED ORDER — METFORMIN HCL 1000 MG PO TABS: ORAL_TABLET | ORAL | 3 refills | Status: DC

## 2017-09-19 MED ORDER — INSULIN PEN NEEDLE 31G X 5 MM MISC: 3 refills | Status: DC

## 2017-09-19 MED ORDER — INSULIN GLARGINE 100 UNIT/ML SOLOSTAR PEN: 50.0000 [IU] | PEN_INJECTOR | Freq: Every day | SUBCUTANEOUS | 0 refills | Status: DC

## 2017-09-19 MED ORDER — TYPHOID VACCINE PO CPDR: 1.0000 | DELAYED_RELEASE_CAPSULE | ORAL | 0 refills | Status: DC

## 2017-09-19 MED ORDER — GEMFIBROZIL 600 MG PO TABS: ORAL_TABLET | ORAL | 3 refills | Status: DC

## 2017-09-19 NOTE — Patient Instructions (Addendum)
  Check your blood sugar 2-3 hours after meals, rather than 29min/1hr, to get a better idea of how high your blood sugar gets.   IF you received an x-ray today, you will receive an invoice from [facility]. Please contact [facility] at [phone] with questions or concerns regarding your invoice.   IF you received labwork today, you will receive an invoice from [place]. Please contact [facility] at [phone] with questions or concerns regarding your invoice.   Our billing staff will not be able to assist you with questions regarding bills from these companies.  You will be contacted with the lab results as soon as they are available. The fastest way to get your results is to activate your My Chart account. Instructions are located on the last page of this paperwork. If you have not heard from [place] regarding the results in 2 weeks, please contact this office.

## 2017-09-19 NOTE — Progress Notes (Signed)
   Subjective:    Patient ID: Shelley Escobar, female    DOB: 09/28/1970, 47 y.o.   MRN: [id number]  HPI    Review of Systems    Objective:   Physical Exam        Assessment & Plan:

## 2017-09-19 NOTE — Assessment & Plan Note (Signed)
Continue current regimen and follow-up with endocrinology upon return from [place].

## 2017-09-19 NOTE — Assessment & Plan Note (Signed)
Await labs.

## 2017-09-19 NOTE — Assessment & Plan Note (Signed)
Healthy lifestyle recommendations.

## 2017-09-19 NOTE — Progress Notes (Signed)
Subjective:    Patient ID: Shelley Escobar, female    DOB: [birth year], 47 y.o.   MRN: [id number]  HPI All information is provided via her son and translated by him. He agreed to translate.  Patient presents for a 3 month diabetic follow up. She reports BS of 180 46min-1hr after eating and AM blood sugars of 90-120. She reports that she is feeling well and has been taking her insulin and medications as prescribed. She also reports good blood pressure control with Systolic BP in the low 580D and Diastolic in the 98P. Reports doing a lot of walking around while at work and staying active while working. Reports a diet mostly of fish, vegetables and rice.   Pt is going to [place] in 2 weeks to visit family and asked for early refills of her insulin to get her through her trip.  Pt is otherwise doing well and has had no issues since last visit. Denies polyuria, polydipsia, dizziness, low blood sugars, tingling or loss of sensation in her feet or hands.   Review of Systems  Constitutional: Negative for activity change, appetite change, chills, diaphoresis, fatigue, fever and unexpected weight change.  HENT: Negative for congestion, hearing loss and tinnitus.   Eyes: Negative for photophobia, pain, redness and visual disturbance.  Respiratory: Negative for shortness of breath.   Cardiovascular: Negative for chest pain, palpitations and leg swelling.  Gastrointestinal: Negative for abdominal distention, abdominal pain, constipation, diarrhea and nausea.  Endocrine: Negative for cold intolerance, heat intolerance, polydipsia, polyphagia and polyuria.  Genitourinary: Negative for difficulty urinating, dysuria, frequency and urgency.  Musculoskeletal: Negative for arthralgias and myalgias.  Skin: Negative for color change, pallor, rash and wound.  Allergic/Immunologic: Negative.   Neurological: Negative for dizziness, syncope, weakness, light-headedness, numbness and headaches.       No numbness or  tingling in toes or fingers.   Hematological: Negative.   Psychiatric/Behavioral: Negative.        Objective:   Physical Exam  Constitutional: She is oriented to person, place, and time. She appears well-developed and well-nourished. No distress.  HENT:  Head: Normocephalic and atraumatic.  Eyes: Pupils are equal, round, and reactive to light. Conjunctivae and EOM are normal.  Neck: Normal range of motion. Neck supple. No JVD present. No thyromegaly present.  Cardiovascular: Normal rate, regular rhythm, normal heart sounds and intact distal pulses. Exam reveals no gallop and no friction rub.  No murmur heard. Pulmonary/Chest: Effort normal and breath sounds normal. No respiratory distress. She has no wheezes. She has no rales. She exhibits no tenderness.  Abdominal: Soft. Bowel sounds are normal. She exhibits no distension and no mass. There is no tenderness.  Musculoskeletal: Normal range of motion. She exhibits no edema or deformity.  Lymphadenopathy:    She has no cervical adenopathy.  Neurological: [name], place, and time. She has normal reflexes. She displays normal reflexes. No cranial nerve deficit. Coordination normal.  Skin: Skin is warm and dry. No rash noted. She is not diaphoretic. No erythema. No pallor.  Psychiatric: She has a normal mood and affect. Her behavior is normal. Judgment and thought content normal.  Nursing note and vitals reviewed.         Assessment & Plan:  1. Diabetes mellitus type 2, uncontrolled, without complications (HCC) Pt was counseled on healthy eating, maintaining physical activity and the importance of taking her medications as prescribed and logging her blood sugars. She was also counseled on taking her  blood sugars 2-3 hours after meals to get a true BS reading instead of 44min-1hr post-prandial. At this time, pt seems to be doing well following her medication regimen and f/u will be done over the phone after lab  results are reviewed.   - Comprehensive metabolic panel - Hemoglobin A1c - HM DIABETES FOOT EXAM  2. Dyslipidemia See above. Awaiting lab results - Lipid panel - gemfibrozil (LOPID) 600 MG tablet; TAKE 1 TABLET(600 MG) BY MOUTH TWICE DAILY BEFORE A MEAL  Dispense: 180 tablet; Refill: 3  3. Obesity (BMI 30.0-34.9) Pt counseled on healthy eating and exercise. Awaiting results. - Comprehensive metabolic panel  4. Anemia, unspecified type Pt has not had iron studies performed recently. Will await results. - CBC with Differential/Platelet - Hemoglobinopathy evaluation - Iron, TIBC and Ferritin Panel  5. Uncontrolled type 2 diabetes mellitus without complication, with long-term current use of insulin ([place]) See above. Pts insulin refilled for her trip. Taking as prescribed and states regimen is working well for her. Will f/u in 3 months to determine any changes in post-prandial glucose levels and A1c.  - metFORMIN (GLUCOPHAGE) 1000 MG tablet; TAKE 1 TABLET(1000 MG) BY MOUTH TWICE DAILY WITH A MEAL  Dispense: 180 tablet; Refill: 3 - Insulin Glargine (LANTUS SOLOSTAR) 100 UNIT/ML Solostar Pen; Inject 50 Units into the skin daily.  Dispense: 15 mL; Refill: 0 - Insulin Pen Needle (B-D UF III MINI PEN NEEDLES) 31G X 5 MM MISC; USE DAILY AS DIRECTED WITH INSULIN  Dispense: 100 each; Refill: 3  6. Need for immunization against typhoid Traveling to [place]. - typhoid (VIVOTIF) DR capsule; Take 1 capsule by mouth every other day.  Dispense: 4 capsule; Refill: 0

## 2017-09-19 NOTE — Progress Notes (Signed)
Patient ID: Shelley Escobar, female    DOB: 01-Apr-1971, 47 y.o.   MRN: [id number]  PCP: Harrison Mons, PA-C  Chief Complaint  Patient presents with  . Diabetes  . Hyperlipidemia    Subjective:   Presents for evaluation of diabetes and anemia. Accompanied by her son, who translates.  She feels well, no problems. Tolerating medications well. Needs some refills, as she will be traveling to [place]. Will run out of Lantus before her next visit with endocrinology.  Checking glucose 30-60 minutes post-prandially, readings about 180. Fasting readings 90-120. Home BP 100's/70's.  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    Review of Systems  Constitutional: Negative for activity change, appetite change, fatigue and unexpected weight change.  HENT: Negative for congestion, dental problem, ear pain, hearing loss, mouth sores, postnasal drip, rhinorrhea, sneezing, sore throat, tinnitus and trouble swallowing.   Eyes: Negative for photophobia, pain, redness and visual disturbance.  Respiratory: Negative for cough, chest tightness and shortness of breath.   Cardiovascular: Negative for chest pain, palpitations and leg swelling.  Gastrointestinal: Negative for abdominal pain, blood in stool, constipation, diarrhea, nausea and vomiting.  Endocrine: Negative for cold intolerance, heat intolerance, polydipsia, polyphagia and polyuria.  Genitourinary: Negative for dysuria, frequency, hematuria and urgency.  Musculoskeletal: Negative for arthralgias, gait problem, myalgias and neck stiffness.  Skin: Negative for rash.  Neurological: Negative for dizziness, speech difficulty, weakness, light-headedness, numbness and headaches.  Hematological: Negative for adenopathy.  Psychiatric/Behavioral: Negative for confusion and sleep disturbance. The patient is not nervous/anxious.        Patient Active Problem List   Diagnosis Date Noted  . Fatty infiltration of  liver 03/10/2016  . Anemia 03/10/2016  . Obesity (BMI 30.0-34.9) 01/27/2012  . Diabetes mellitus type 2, uncontrolled, without complications ([place]) 63/87/5643  . Dyslipidemia 07/02/2011     Prior to Admission medications   Medication Sig Start Date End Date Taking? Authorizing Provider  cyclobenzaprine (FLEXERIL) 5 MG tablet Take 1 tablet (5 mg total) by mouth 3 (three) times daily as needed for muscle spasms. 09/06/17  Yes Timmothy Euler, [place] D, PA-C  gemfibrozil (LOPID) 600 MG tablet TAKE 1 TABLET(600 MG) BY MOUTH TWICE DAILY BEFORE A MEAL 06/21/17  Yes Karem Farha, PA-C  insulin aspart (NOVOLOG FLEXPEN) 100 UNIT/ML FlexPen Inject 5 units into the skin before each meal 11/29/16  Yes Elayne Snare, MD  Insulin Pen Needle (B-D UF III MINI PEN NEEDLES) 31G X 5 MM MISC USE DAILY AS DIRECTED WITH INSULIN 08/05/16  Yes Shaneequa Bahner, PA-C  Insulin Pen Needle (B-D UF III MINI PEN NEEDLES) 31G X 5 MM MISC USE AS DIRECTED DAILY WITH INSULIN 07/31/17  Yes Anabela Crayton, PA-C  LANTUS SOLOSTAR 100 UNIT/ML Solostar Pen INJECT 50 UNITS UNDER SKIN TWICE DAILY 03/19/17  Yes Elayne Snare, MD  lisinopril (PRINIVIL,ZESTRIL) 2.5 MG tablet Take 1 tablet (2.5 mg total) by mouth daily. 06/21/17  Yes Cyriah Childrey, PA-C  metFORMIN (GLUCOPHAGE) 1000 MG tablet TAKE 1 TABLET(1000 MG) BY MOUTH TWICE DAILY WITH A MEAL 08/05/16  Yes Gracelin Weisberg, PA-C     No Known Allergies     Objective:  Physical Exam  Constitutional: She is oriented to person, place, and time. She appears well-developed and well-nourished. She is active and cooperative. No distress.  BP 102/62   Pulse 94   Temp 98.8 F (37.1 C)   Resp 16   Ht 5' 4.17" (1.63 m)   Wt 163  lb 9.6 oz (74.2 kg)   SpO2 99%   BMI 27.93 kg/m   HENT:  Head: Normocephalic and atraumatic.  Right Ear: Hearing normal.  Left Ear: Hearing normal.  Eyes: Conjunctivae are normal. No scleral icterus.  Neck: Normal range of motion. Neck supple. No thyromegaly present.    Cardiovascular: Normal rate, regular rhythm and normal heart sounds.  Pulses:      Radial pulses are 2+ on the right side, and 2+ on the left side.  Pulmonary/Chest: Effort normal and breath sounds normal.  Lymphadenopathy:       Head (right side): No tonsillar, no preauricular, no posterior auricular and no occipital adenopathy present.       Head (left side): No tonsillar, no preauricular, no posterior auricular and no occipital adenopathy present.    She has no cervical adenopathy.       Right: No supraclavicular adenopathy present.       Left: No supraclavicular adenopathy present.  Neurological: [name], place, and time. No sensory deficit.  Skin: Skin is warm, dry and intact. No rash noted. No cyanosis or erythema. Nails show no clubbing.  Psychiatric: She has a normal mood and affect. Her speech is normal and behavior is normal.   Wt Readings from Last 3 Encounters:  09/19/17 163 lb 9.6 oz (74.2 kg)  09/06/17 162 lb (73.5 kg)  08/02/17 163 lb 9.6 oz (74.2 kg)       Assessment & Plan:   Problem List Items Addressed This Visit    Diabetes mellitus type 2, uncontrolled, without complications ([place]) - Primary    Continue current regimen and follow-up with endocrinology upon return from [place].       Relevant Medications   metFORMIN (GLUCOPHAGE) 1000 MG tablet   Insulin Glargine (LANTUS SOLOSTAR) 100 UNIT/ML Solostar Pen   Insulin Pen Needle (B-D UF III MINI PEN NEEDLES) 31G X 5 MM MISC   Other Relevant Orders   Comprehensive metabolic panel   Hemoglobin A1c   HM DIABETES FOOT EXAM (Completed)   Dyslipidemia    Await labs. Adjust regimen as indicated by results. LDL goal <70.  Has been well controlled without statin.      Relevant Medications   gemfibrozil (LOPID) 600 MG tablet   Other Relevant Orders   Lipid panel   Obesity (BMI 30.0-34.9)    Healthy lifestyle recommendations.      Relevant Orders   Comprehensive metabolic panel    Anemia    Await labs.      Relevant Orders   CBC with Differential/Platelet    Other Visit Diagnoses    Uncontrolled type 2 diabetes mellitus without complication, with long-term current use of insulin (HCC)       Relevant Medications   metFORMIN (GLUCOPHAGE) 1000 MG tablet   Insulin Glargine (LANTUS SOLOSTAR) 100 UNIT/ML Solostar Pen   Insulin Pen Needle (B-D UF III MINI PEN NEEDLES) 31G X 5 MM MISC   Need for immunization against typhoid       Relevant Medications   typhoid (VIVOTIF) DR capsule       Return in about 3 months (around 12/19/2017) for re-evaluation of diabetes, blood pressure, cholesterol and anemia.   Fara Chute, PA-C [facility]

## 2017-09-19 NOTE — Assessment & Plan Note (Signed)
Await labs. Adjust regimen as indicated by results. LDL goal <70.  Has been well controlled without statin.

## 2017-09-21 ENCOUNTER — Encounter: Payer: Self-pay | Admitting: Physician Assistant

## 2017-09-21 LAB — HEMOGLOBINOPATHY EVALUATION
HGB A: 72.3 % — AB (ref 96.4–98.8)
HGB C: 0 %
HGB S: 0 %
HGB VARIANT: 24.6 % — AB
Hemoglobin A2 Quantitation: 3.1 % (ref 1.8–3.2)
Hemoglobin F Quantitation: 0 % (ref 0.0–2.0)

## 2017-09-21 LAB — COMPREHENSIVE METABOLIC PANEL
A/G RATIO: 1.3 (ref 1.2–2.2)
ALT: 18 IU/L (ref 0–32)
AST: 22 IU/L (ref 0–40)
Albumin: 4.5 g/dL (ref 3.5–5.5)
Alkaline Phosphatase: 71 IU/L (ref 39–117)
BILIRUBIN TOTAL: 0.3 mg/dL (ref 0.0–1.2)
BUN/Creatinine Ratio: 30 — ABNORMAL HIGH (ref 9–23)
BUN: 20 mg/dL (ref 6–24)
CHLORIDE: 102 mmol/L (ref 96–106)
CO2: 19 mmol/L — ABNORMAL LOW (ref 20–29)
Calcium: 10.5 mg/dL — ABNORMAL HIGH (ref 8.7–10.2)
Creatinine, Ser: 0.67 mg/dL (ref 0.57–1.00)
GFR calc non Af Amer: 104 mL/min/{1.73_m2} (ref >59)
GFR, EST AFRICAN AMERICAN: 119 mL/min/{1.73_m2} (ref >59)
GLUCOSE: 143 mg/dL — AB (ref 65–99)
Globulin, Total: 3.5 g/dL (ref 1.5–4.5)
POTASSIUM: 4.8 mmol/L (ref 3.5–5.2)
Sodium: 139 mmol/L (ref 134–144)
Total Protein: 8 g/dL (ref 6.0–8.5)

## 2017-09-21 LAB — HEMOGLOBIN A1C
Est. average glucose Bld gHb Est-mCnc: 186 mg/dL
Hgb A1c MFr Bld: 8.1 % — ABNORMAL HIGH (ref 4.8–5.6)

## 2017-09-21 LAB — LIPID PANEL
CHOLESTEROL TOTAL: 80 mg/dL — AB (ref 100–199)
Chol/HDL Ratio: 2.6 ratio (ref 0.0–4.4)
HDL: 31 mg/dL — AB (ref >39)
LDL Calculated: 37 mg/dL (ref 0–99)
Triglycerides: 62 mg/dL (ref 0–149)
VLDL CHOLESTEROL CAL: 12 mg/dL (ref 5–40)

## 2017-09-21 LAB — CBC WITH DIFFERENTIAL/PLATELET
BASOS ABS: 0 10*3/uL (ref 0.0–0.2)
BASOS: 0 %
EOS (ABSOLUTE): 0.3 10*3/uL (ref 0.0–0.4)
Eos: 4 %
Hematocrit: 32.9 % — ABNORMAL LOW (ref 34.0–46.6)
Hemoglobin: 10.1 g/dL — ABNORMAL LOW (ref 11.1–15.9)
IMMATURE GRANS (ABS): 0 10*3/uL (ref 0.0–0.1)
Immature Granulocytes: 0 %
LYMPHS: 25 %
Lymphocytes Absolute: 2 10*3/uL (ref 0.7–3.1)
MCH: 22.9 pg — AB (ref 26.6–33.0)
MCHC: 30.7 g/dL — AB (ref 31.5–35.7)
MCV: 74 fL — AB (ref 79–97)
MONOS ABS: 0.5 10*3/uL (ref 0.1–0.9)
Monocytes: 6 %
NEUTROS ABS: 5.1 10*3/uL (ref 1.4–7.0)
Neutrophils: 65 %
PLATELETS: 462 10*3/uL — AB (ref 150–379)
RBC: 4.42 x10E6/uL (ref 3.77–5.28)
RDW: 16.2 % — AB (ref 12.3–15.4)
WBC: 7.9 10*3/uL (ref 3.4–10.8)

## 2017-09-21 LAB — IRON,TIBC AND FERRITIN PANEL
Ferritin: 19 ng/mL (ref 15–150)
Iron Saturation: 31 % (ref 15–55)
Iron: 180 ug/dL — ABNORMAL HIGH (ref 27–159)
Total Iron Binding Capacity: 584 ug/dL (ref 250–450)
UIBC: 404 ug/dL (ref 131–425)

## 2017-09-27 ENCOUNTER — Encounter: Payer: Self-pay | Admitting: Physician Assistant

## 2017-10-05 ENCOUNTER — Telehealth: Payer: Self-pay | Admitting: Physician Assistant

## 2017-10-05 NOTE — Telephone Encounter (Signed)
Fax recieved from [facility] in regards to Vivotif capsule stating "Please note authorization is not required for this medication on the members plan, please note this medication is not covered through the members pharmacy benefit. Please have the patient contact our customer service department for question on how to obtain through their medical benefit."  Phone call to [facility] on [address], spoke with Chance. Patient has not picked up medication.   Nikki, transferred to medical intake department. Medical intake not able to resolve, no procedure code for medication dispensed through pharmacy.

## 2017-10-09 ENCOUNTER — Telehealth: Payer: Self-pay | Admitting: *Deleted

## 2017-10-09 NOTE — Telephone Encounter (Signed)
The vivotif or is not covered by pharmacy plan.  But it should be covered by insurance.  Pharmacy advises that call insurance company to verify it is covered.

## 2017-10-21 ENCOUNTER — Other Ambulatory Visit: Payer: Self-pay | Admitting: Physician Assistant

## 2017-10-21 DIAGNOSIS — Z794 Long term (current) use of insulin: Principal | ICD-10-CM

## 2017-10-21 DIAGNOSIS — IMO0001 Reserved for inherently not codable concepts without codable children: Secondary | ICD-10-CM

## 2017-10-21 DIAGNOSIS — E1165 Type 2 diabetes mellitus with hyperglycemia: Principal | ICD-10-CM

## 2017-11-20 ENCOUNTER — Other Ambulatory Visit: Payer: Self-pay | Admitting: Physician Assistant

## 2017-11-20 DIAGNOSIS — Z794 Long term (current) use of insulin: Principal | ICD-10-CM

## 2017-11-20 DIAGNOSIS — E1165 Type 2 diabetes mellitus with hyperglycemia: Principal | ICD-10-CM

## 2017-11-20 DIAGNOSIS — IMO0001 Reserved for inherently not codable concepts without codable children: Secondary | ICD-10-CM

## 2017-12-13 ENCOUNTER — Other Ambulatory Visit: Payer: Self-pay

## 2017-12-13 ENCOUNTER — Ambulatory Visit: Payer: BLUE CROSS/BLUE SHIELD | Admitting: Physician Assistant

## 2017-12-13 ENCOUNTER — Encounter: Payer: Self-pay | Admitting: Physician Assistant

## 2017-12-13 VITALS — BP 99/72 | HR 90 | Temp 97.8°F | Resp 18 | Ht 63.78 in | Wt 166.6 lb

## 2017-12-13 DIAGNOSIS — E1165 Type 2 diabetes mellitus with hyperglycemia: Secondary | ICD-10-CM

## 2017-12-13 DIAGNOSIS — IMO0001 Reserved for inherently not codable concepts without codable children: Secondary | ICD-10-CM

## 2017-12-13 DIAGNOSIS — R300 Dysuria: Secondary | ICD-10-CM

## 2017-12-13 DIAGNOSIS — B3731 Acute candidiasis of vulva and vagina: Secondary | ICD-10-CM

## 2017-12-13 DIAGNOSIS — R82998 Other abnormal findings in urine: Secondary | ICD-10-CM | POA: Diagnosis not present

## 2017-12-13 DIAGNOSIS — R81 Glycosuria: Secondary | ICD-10-CM | POA: Diagnosis not present

## 2017-12-13 DIAGNOSIS — N898 Other specified noninflammatory disorders of vagina: Secondary | ICD-10-CM | POA: Diagnosis not present

## 2017-12-13 DIAGNOSIS — B373 Candidiasis of vulva and vagina: Secondary | ICD-10-CM | POA: Diagnosis not present

## 2017-12-13 LAB — POCT URINALYSIS DIP (MANUAL ENTRY)
BILIRUBIN UA: NEGATIVE
Ketones, POC UA: NEGATIVE mg/dL
Nitrite, UA: NEGATIVE
PH UA: 5.5 (ref 5.0–8.0)
Protein Ur, POC: NEGATIVE mg/dL
RBC UA: NEGATIVE
Spec Grav, UA: 1.015 (ref 1.010–1.025)
UROBILINOGEN UA: 0.2 U/dL

## 2017-12-13 LAB — POCT WET + KOH PREP
Trich by wet prep: ABSENT
Yeast by KOH: ABSENT

## 2017-12-13 LAB — POCT GLYCOSYLATED HEMOGLOBIN (HGB A1C): Hemoglobin A1C: 9.4 % — AB (ref 4.0–5.6)

## 2017-12-13 LAB — GLUCOSE, POCT (MANUAL RESULT ENTRY): POC Glucose: 321 mg/dl — AB (ref 70–99)

## 2017-12-13 MED ORDER — FLUCONAZOLE 150 MG PO TABS: 150.0000 mg | ORAL_TABLET | ORAL | 0 refills | Status: DC

## 2017-12-13 MED ORDER — DULAGLUTIDE 0.75 MG/0.5ML ~~LOC~~ SOAJ: 0.7500 mg | SUBCUTANEOUS | 3 refills | Status: DC

## 2017-12-13 NOTE — Patient Instructions (Addendum)
Your diabetes is uncontrolled. You are peeing sugar and that has led to a yeast infection.  For diabetes, try to limit the amount of rice and noodles you are eating.  Start Trulicity once weekly.  Continue with metformin and Lantus as prescribed.   I have given you a pill of Diflucan to take now to help treat the yeast and once again in 1 week if you are still having symptoms.  Please follow-up for diabetes follow-up in the next 2 weeks.  If any of your symptoms worsen return sooner.  Diabetes is a very complicated disease...lets simplify it.   An easy way to look at it to understand the complications is if you think of the extra sugar floating in your blood stream as glass shards floating through your blood stream.   Diabetes affects your small vessels first: 1) The glass shards (sugar) scrapes down the tiny blood vessels in your eyes and lead to diabetic retinopathy, the leading cause of blindness in the [place]. Diabetes is the leading cause of newly diagnosed adult (63 to 49 years of age) blindness in the [place].  2) The glass shards scratches down the tiny vessels of your legs leading to nerve damage called neuropathy and can lead to amputations of your feet. More than 60% of all non-traumatic amputations of lower limbs occur in people with diabetes.  3) Over time the small vessels in your brain are shredded and closed off, individually this does not cause any problems but over a long period of time many of the small vessels being blocked can lead to Vascular Dementia.   4) Your kidney's are a filter system and have a "net" that keeps certain things in the body and lets bad things out. Sugar shreds this net and leads to kidney damage and eventually failure. Decreasing the sugar that is destroying the net and certain blood pressure medications can help stop or decrease progression of kidney disease. Diabetes was the primary cause of kidney failure in 44 percent of all new cases in 2011.  5)  Diabetes also destroys the small vessels in your penis that lead to erectile dysfunction. Eventually the vessels are so damaged that you may not be responsive to cialis or viagra.   Diabetes and your large vessels: Your larger vessels consist of your coronary arteries in your heart and the carotid vessels to your brain. Diabetes or even increased sugars put you at 300% increased risk of heart attack and stroke and this is why.. The sugar scrapes down your large blood vessels and your body sees this as an internal injury and tries to repair itself. Just like you get a scab on your skin, your platelets will stick to the blood vessel wall trying to heal it. This is why we have diabetics on low dose aspirin daily, this prevents the platelets from sticking and can prevent plaque formation. In addition, your body takes cholesterol and tries to shove it into the open wound. This is why we want your LDL, or bad cholesterol, below 70.   The combination of platelets and cholesterol over 5-10 years forms plaque that can break off and cause a heart attack or stroke.   PLEASE REMEMBER:   Diabetes is preventable! Up to 59 percent of complications and morbidities among individuals with type 2 diabetes can be prevented, delayed, or effectively treated and minimized with regular visits to a health professional, appropriate monitoring and medication, and a healthy diet and lifestyle.  Here is some information  to help you keep your heart healthy: Move it! - Aim for 30 mins of activity every day. Take it slowly at first. Talk to [place] before starting any new exercise program.   Lose it.  -Body Mass Index (BMI) can indicate if you need to lose weight. A healthy range is 18.5-24.9. For a BMI calculator, go to [employer]  Waist Management -Excess abdominal fat is a risk factor for heart disease, diabetes, asthma, stroke and more. Ideal waist circumference is less than 35" for women and less than 40" for men.      Eat Right -focus on fruits, vegetables, whole grains, and meals you make yourself. Avoid foods with trans fat and high sugar/sodium content.   Snooze or Snore? - Loud snoring can be a sign of sleep apnea, a significant risk factor for high blood pressure, heart attach, stroke, and heart arrhythmias.  Kick the habit -Quit Smoking! Avoid second hand smoke. A single cigarette raises your blood pressure for 20 mins and increases the risk of heart attack and stroke for the next 24 hours.   Are Aspirin and Supplements right for you? -Add ENTERIC COATED low dose 81 mg Aspirin daily OR can do every other day if you have easy bruising to protect your heart and head. As well as to reduce risk of Colon Cancer by 20 %, Skin Cancer by 26 % , Melanoma by 46% and Pancreatic cancer by 60%  Say "No to Stress -There may be little you can do about problems that cause stress. However, techniques such as long walks, meditation, and exercise can help you manage it.   Start Now! - Make changes one at a time and set reasonable goals to increase your likelihood of success.         IF you received an x-ray today, you will receive an invoice from [facility]. Please contact [facility] at [phone] with questions or concerns regarding your invoice.   IF you received labwork today, you will receive an invoice from [facility]. Please contact [facility] at [phone] with questions or concerns regarding your invoice.   Our billing staff will not be able to assist you with questions regarding bills from these companies.  You will be contacted with the lab results as soon as they are available. The fastest way to get your results is to activate your My Chart account. Instructions are located on the last page of this paperwork. If you have not heard from [place] regarding the results in 2 weeks, please contact this office.

## 2017-12-13 NOTE — Progress Notes (Signed)
12/13/2017 at 6:52 PM  Shelley Escobar Particia Escobar / DOB: 1971-01-24 / MRN: [id number]  The patient has Diabetes mellitus type 2, uncontrolled, without complications ([place]); Dyslipidemia; Obesity (BMI 30.0-34.9); Fatty infiltration of liver; and Anemia on their problem list.  SUBJECTIVE  Shelley Escobar is a 47 y.o. female who complains of vulva itching and burning x 2 weeks.  Has burning on the outside of her vagina with urination, not on the inside. She denies hematuria, urinary frequency, urinary urgency, flank pain, abdominal pain, pelvic pain and vaginal discharge. Has not tried anything for relief.    PMH of type 2 diabetes.  Last A1c in 09/2017 was 8.1.  Takes metformin 1000 mg twice daily and Lantus 50 units at nighttime.  Reports her sugars have been in the mid 100s recently.  He is to follow with endocrinology but has not gone back because she does not like him.  Used to take Trulicity but stopped because she was told it was not working.  She eats lots of rice and noodles throughout the day.   She  has a past medical history of Acute appendicitis (01/13/2016), Anemia, DM (diabetes mellitus) ([place]), Hyperlipidemia, SVD (spontaneous vaginal delivery), and Vitamin D deficiency.    Medications reviewed and updated by myself where necessary, and exist elsewhere in the encounter.   Ms. Eblin has No Known Allergies. She  reports that she has never smoked. She has never used smokeless tobacco. She reports that she does not drink alcohol or use drugs. She  reports that she currently engages in sexual activity and has had partners who are Female. She reports using the following method of birth control/protection: Surgical. The patient  has a past surgical history that includes Tubal ligation; Hysteroscopy w/D&C (N/A, 08/07/2013); Intrauterine device (iud) insertion (09/13/2013); and laparoscopic appendectomy (N/A, 01/13/2016).  Her She was adopted. Family history is unknown by patient.  Review of Systems  Constitutional:  Negative for chills and malaise/fatigue.  Eyes: Negative for blurred vision and double vision.  Gastrointestinal: Negative for nausea and vomiting.  Neurological: Negative for dizziness and headaches.  Endo/Heme/Allergies: Negative for polydipsia.    OBJECTIVE  Her  height is 5' 3.78" (1.62 m) and weight is 166 lb 9.6 oz (75.6 kg). Her oral temperature is 97.8 F (36.6 C). Her blood pressure is 99/72 and her pulse is 90. Her respiration is 18 and oxygen saturation is 97%.  The patient's body mass index is 28.79 kg/m.  Physical Exam  Constitutional: She is oriented to person, place, and time. She appears well-developed and well-nourished. No distress.  HENT:  Head: Normocephalic and atraumatic.  Eyes: Conjunctivae are normal.  Neck: Normal range of motion.  Pulmonary/Chest: Effort normal.  Genitourinary: There is no tenderness on the right labia. There is no tenderness on the left labia. No erythema or tenderness in the vagina. Vaginal discharge (thick creamy white discharge in vaginal canal) found.  Genitourinary Comments: Erythema of the vulva and vaginal mucosa, mild vulvar edema noted.      Neurological: [name], place, and time.  Skin: Skin is warm and dry.  Psychiatric: She has a normal mood and affect.  Vitals reviewed.   Results for orders placed or performed in visit on 12/13/17 (from the past 24 hour(s))  POCT urinalysis dipstick     Status: Abnormal   Collection Time: 12/13/17  3:37 PM  Result Value Ref Range   Color, UA yellow yellow   Clarity, UA cloudy (A)  clear   Glucose, UA =500 (A) negative mg/dL   Bilirubin, UA negative negative   Ketones, POC UA negative negative mg/dL   Spec Grav, UA 1.015 1.010 - 1.025   Blood, UA negative negative   pH, UA 5.5 5.0 - 8.0   Protein Ur, POC negative negative mg/dL   Urobilinogen, UA 0.2 0.2 or 1.0 E.U./dL   Nitrite, UA Negative Negative   Leukocytes, UA Trace (A) Negative  POCT Wet + KOH  Prep     Status: Abnormal   Collection Time: 12/13/17  4:04 PM  Result Value Ref Range   Yeast by KOH Absent Absent   Yeast by wet prep Present (A) Absent   WBC by wet prep Moderate (A) Few   Clue Cells Wet Prep HPF POC None None   Trich by wet prep Absent Absent   Bacteria Wet Prep HPF POC Few Few   Epithelial Cells By [employer] ([facility]) Few None, Few, Too numerous to count   RBC,UR,HPF,POC None None RBC/hpf  POCT glycosylated hemoglobin (Hb A1C)     Status: Abnormal   Collection Time: 12/13/17  4:13 PM  Result Value Ref Range   Hemoglobin A1C 9.4 (A) 4.0 - 5.6 %   HbA1c POC (<> result, manual entry)  4.0 - 5.6 %   HbA1c, POC (prediabetic range)  5.7 - 6.4 %   HbA1c, POC (controlled diabetic range)  0.0 - 7.0 %  POCT glucose (manual entry)     Status: Abnormal   Collection Time: 12/13/17  4:13 PM  Result Value Ref Range   POC Glucose 321 (A) 70 - 99 mg/dl    ASSESSMENT & PLAN  Verna Desrocher was seen today for vaginal itching and dysuria.  Diagnoses and all orders for this visit:  Diabetes mellitus type 2, uncontrolled, without complications (HCC) -     Dulaglutide (TRULICITY) 1.66 AY/3.0ZS SOPN; Inject 0.75 mg into the skin once a week.  Vulvovaginitis due to yeast -     fluconazole (DIFLUCAN) 150 MG tablet; Take 1 tablet (150 mg total) by mouth once a week. Repeat if needed  Dysuria -     POCT urinalysis dipstick  Vagina itching -     POCT Wet + KOH Prep  Glucosuria -     POCT glycosylated hemoglobin (Hb A1C) -     POCT glucose (manual entry)  Leukocytes in urine -     Urine Culture  Type 2 diabetes uncontrolled.  A1c has increased from 8.1 to 9.4. point-of-care glucose of 321.  She has glucosuria.  No ketones.  Glucosuria likely responsible for vulvovaginitis candidiasis.  Had detailed discussion with patient about uncontrolled diabetes and the potential complications.  Recommend adding on Trulicity at this time.  Discussed dietary changes like decreasing the amount of  rice and noodles patient is consuming a day.  Patient has diabetes follow-up in a couple weeks.  Given Rx for Diflucan.    Side effects, risks, benefits, and alternatives of the medications and treatment plan prescribed today were discussed, and patient expressed understanding of the instructions given. No barriers to understanding were identified. Red flags discussed in detail. Pt expressed understanding regarding what to do in case of emergency/urgent symptoms.   Tenna Delaine, PA-C  [facility] 12/13/2017 6:52 PM

## 2017-12-14 LAB — URINE CULTURE

## 2018-01-02 ENCOUNTER — Ambulatory Visit: Payer: BLUE CROSS/BLUE SHIELD | Admitting: Physician Assistant

## 2018-01-02 ENCOUNTER — Encounter: Payer: Self-pay | Admitting: Physician Assistant

## 2018-01-02 ENCOUNTER — Other Ambulatory Visit: Payer: Self-pay

## 2018-01-02 VITALS — BP 104/60 | HR 96 | Temp 99.0°F | Resp 18 | Ht 64.0 in | Wt 168.0 lb

## 2018-01-02 DIAGNOSIS — Z9189 Other specified personal risk factors, not elsewhere classified: Secondary | ICD-10-CM

## 2018-01-02 DIAGNOSIS — R81 Glycosuria: Secondary | ICD-10-CM | POA: Diagnosis not present

## 2018-01-02 DIAGNOSIS — E663 Overweight: Secondary | ICD-10-CM | POA: Diagnosis not present

## 2018-01-02 DIAGNOSIS — E1165 Type 2 diabetes mellitus with hyperglycemia: Secondary | ICD-10-CM | POA: Diagnosis not present

## 2018-01-02 DIAGNOSIS — Z794 Long term (current) use of insulin: Secondary | ICD-10-CM

## 2018-01-02 DIAGNOSIS — IMO0001 Reserved for inherently not codable concepts without codable children: Secondary | ICD-10-CM

## 2018-01-02 LAB — POCT URINALYSIS DIP (MANUAL ENTRY)
BILIRUBIN UA: NEGATIVE
GLUCOSE UA: NEGATIVE mg/dL
Ketones, POC UA: NEGATIVE mg/dL
Leukocytes, UA: NEGATIVE
Nitrite, UA: NEGATIVE
PH UA: 5.5 (ref 5.0–8.0)
Protein Ur, POC: NEGATIVE mg/dL
RBC UA: NEGATIVE
SPEC GRAV UA: 1.025 (ref 1.010–1.025)
UROBILINOGEN UA: 0.2 U/dL

## 2018-01-02 LAB — GLUCOSE, POCT (MANUAL RESULT ENTRY): POC GLUCOSE: 120 mg/dL — AB (ref 70–99)

## 2018-01-02 MED ORDER — CLOTRIMAZOLE 1 % EX CREA: 1.0000 "application " | TOPICAL_CREAM | Freq: Two times a day (BID) | CUTANEOUS | 1 refills | Status: DC

## 2018-01-02 MED ORDER — FREESTYLE LIBRE 14 DAY SENSOR MISC: 1.0000 [IU] | 1 refills | Status: DC

## 2018-01-02 MED ORDER — INSULIN GLARGINE 100 UNIT/ML SOLOSTAR PEN: PEN_INJECTOR | SUBCUTANEOUS | 0 refills | Status: DC

## 2018-01-02 MED ORDER — DULAGLUTIDE 0.75 MG/0.5ML ~~LOC~~ SOAJ: 0.7500 mg | SUBCUTANEOUS | 0 refills | Status: DC

## 2018-01-02 MED ORDER — FLUCONAZOLE 150 MG PO TABS: 150.0000 mg | ORAL_TABLET | ORAL | 0 refills | Status: DC

## 2018-01-02 NOTE — Patient Instructions (Addendum)
For diabetes, start using freestyle libre sensor. Check sugar 4 times a day. Once in the morning, two times after your largest meals, and at night time. Continue working on diet. Decrease sugar where you can. Continue all medications. Follow up in one month and bring freestyle monitor at that visit.     Diabetes Mellitus and Nutrition When you have diabetes (diabetes mellitus), it is very important to have healthy eating habits because your blood sugar (glucose) levels are greatly affected by what you eat and drink. Eating healthy foods in the appropriate amounts, at about the same times every day, can help you:  Control your blood glucose.  Lower your risk of heart disease.  Improve your blood pressure.  Reach or maintain a healthy weight.  Every person with diabetes is different, and each person has different needs for a meal plan. Your health care provider may recommend that you work with a diet and nutrition specialist (dietitian) to make a meal plan that is best for you. Your meal plan may vary depending on factors such as:  The calories you need.  The medicines you take.  Your weight.  Your blood glucose, blood pressure, and cholesterol levels.  Your activity level.  Other health conditions you have, such as heart or kidney disease.  How do carbohydrates affect me? Carbohydrates affect your blood glucose level more than any other type of food. Eating carbohydrates naturally increases the amount of glucose in your blood. Carbohydrate counting is a method for keeping track of how many carbohydrates you eat. Counting carbohydrates is important to keep your blood glucose at a healthy level, especially if you use insulin or take certain oral diabetes medicines. It is important to know how many carbohydrates you can safely have in each meal. This is different for every person. Your dietitian can help you calculate how many carbohydrates you should have at each meal and for  snack. Foods that contain carbohydrates include:  Bread, cereal, rice, pasta, and crackers.  Potatoes and corn.  Peas, beans, and lentils.  Milk and yogurt.  Fruit and juice.  Desserts, such as cakes, cookies, ice cream, and candy.  How does alcohol affect me? Alcohol can cause a sudden decrease in blood glucose (hypoglycemia), especially if you use insulin or take certain oral diabetes medicines. Hypoglycemia can be a life-threatening condition. Symptoms of hypoglycemia (sleepiness, dizziness, and confusion) are similar to symptoms of having too much alcohol. If your health care provider says that alcohol is safe for you, follow these guidelines:  Limit alcohol intake to no more than 1 drink per day for nonpregnant women and 2 drinks per day for men. One drink equals 12 oz of beer, 5 oz of wine, or 1 oz of hard liquor.  Do not drink on an empty stomach.  Keep yourself hydrated with water, diet soda, or unsweetened iced tea.  Keep in mind that regular soda, juice, and other mixers may contain a lot of sugar and must be counted as carbohydrates.  What are tips for following this plan? Reading food labels  Start by checking the serving size on the label. The amount of calories, carbohydrates, fats, and other nutrients listed on the label are based on one serving of the food. Many foods contain more than one serving per package.  Check the total grams (g) of carbohydrates in one serving. You can calculate the number of servings of carbohydrates in one serving by dividing the total carbohydrates by 15. For example, if a  food has 30 g of total carbohydrates, it would be equal to 2 servings of carbohydrates.  Check the number of grams (g) of saturated and trans fats in one serving. Choose foods that have low or no amount of these fats.  Check the number of milligrams (mg) of sodium in one serving. Most people should limit total sodium intake to less than 2,300 mg per day.  Always  check the nutrition information of foods labeled as "low-fat" or "nonfat". These foods may be higher in added sugar or refined carbohydrates and should be avoided.  Talk to your dietitian to identify your daily goals for nutrients listed on the label. Shopping  Avoid buying canned, premade, or processed foods. These foods tend to be high in fat, sodium, and added sugar.  Shop around the outside edge of the grocery store. This includes fresh fruits and vegetables, bulk grains, fresh meats, and fresh dairy. Cooking  Use low-heat cooking methods, such as baking, instead of high-heat cooking methods like deep frying.  Cook using healthy oils, such as olive, canola, or sunflower oil.  Avoid cooking with butter, cream, or high-fat meats. Meal planning  Eat meals and snacks regularly, preferably at the same times every day. Avoid going long periods of time without eating.  Eat foods high in fiber, such as fresh fruits, vegetables, beans, and whole grains. Talk to your dietitian about how many servings of carbohydrates you can eat at each meal.  Eat 4-6 ounces of lean protein each day, such as lean meat, chicken, fish, eggs, or tofu. 1 ounce is equal to 1 ounce of meat, chicken, or fish, 1 egg, or 1/4 cup of tofu.  Eat some foods each day that contain healthy fats, such as avocado, nuts, seeds, and fish. Lifestyle   Check your blood glucose regularly.  Exercise at least 30 minutes 5 or more days each week, or as told by your health care provider.  Take medicines as told by your health care provider.  Do not use any products that contain nicotine or tobacco, such as cigarettes and e-cigarettes. If you need help quitting, ask your health care provider.  Work with a Social worker or diabetes educator to identify strategies to manage stress and any emotional and social challenges. What are some questions to ask my health care provider?  Do I need to meet with a diabetes educator?  Do I need  to meet with a dietitian?  What number can I call if I have questions?  When are the best times to check my blood glucose? Where to find more information:  American Diabetes Association: diabetes.org/food-and-fitness/food  Academy of Nutrition and Dietetics: PokerClues.dk  [employer] of Diabetes and Digestive and Kidney Diseases (NIH): ContactWire.be Summary  A healthy meal plan will help you control your blood glucose and maintain a healthy lifestyle.  Working with a diet and nutrition specialist (dietitian) can help you make a meal plan that is best for you.  Keep in mind that carbohydrates and alcohol have immediate effects on your blood glucose levels. It is important to count carbohydrates and to use alcohol carefully. This information is not intended to replace advice given to you by your health care provider. Make sure you discuss any questions you have with your health care provider. Document Released: 03/03/2005 Document Revised: 07/11/2016 Document Reviewed: 07/11/2016 Elsevier Interactive Patient Education  2018 [employer].   IF you received an x-ray today, you will receive an invoice from [facility]. Please contact [facility] at  [phone] with questions or concerns regarding your invoice.   IF you received labwork today, you will receive an invoice from [place]. Please contact [facility] at [phone] with questions or concerns regarding your invoice.   Our billing staff will not be able to assist you with questions regarding bills from these companies.  You will be contacted with the lab results as soon as they are available. The fastest way to get your results is to activate your My Chart account. Instructions are located on the last page of this paperwork. If you have not heard from [place] regarding the results in 2  weeks, please contact this office.

## 2018-01-02 NOTE — Progress Notes (Signed)
MRN: [id number]  Subjective:   Shelley Escobar is a 47 y.o. female who presents for follow up of Type 2 diabetes mellitus. Last A1C was 9.4.  Trulicity was added at last visit. Used to follow with endrocrinology but stopped going because she didn't like him. Per chart review, he did recommend bolus insulin at some point but she does not remember starting this.    Patient is currently managed with 10 mg twice daily, Lantus 50 units at night, and Trulicity 4.09 mg/week. Admits good compliance. Denies adverse effects including metallic taste, hypoglycemia, nausea, vomiting.   Patient is not checking home blood sugars.  He is to use continuous monitor, stopped because she was checking too much and was stressed out about the high numbers.  Current symptoms include none. Patient denies foot ulcerations, increased appetite, nausea, paresthesia of the feet, polydipsia, polyuria, visual disturbances, vomiting and weight loss. Patient is checking their feet daily. No foot concerns. Last diabetic eye exam eye exam 06/21/2017.   Diet has changed since last visit.  She has cut out coffee with sugar in the morning.  She has decreased rice consumption.  Other than that, diet has remained the same.  She is not currently exercising. Denies smoking or alcohol use.   Known diabetic complications: none  Immunizations: Flu vaccine:2018, pneumococal vaccine: 2009  Other concerns: Would like Rx for diflucan to have in the future in case she experiences a yeast infection due to uncontrolled diabetes as she did at her last OV.  Patient Active Problem List   Diagnosis Date Noted  . Fatty infiltration of liver 03/10/2016  . Anemia 03/10/2016  . Obesity (BMI 30.0-34.9) 01/27/2012  . Diabetes mellitus type 2, uncontrolled, without complications ([place]) 73/53/2992  . Dyslipidemia 07/02/2011   Past Medical History:  Diagnosis Date  . Acute appendicitis 01/13/2016  . Anemia    hx  . DM (diabetes mellitus) ([place])   .  Hyperlipidemia   . SVD (spontaneous vaginal delivery)    x 3  . Vitamin D deficiency    Social History   Socioeconomic History  . Marital status: Married    Spouse name: [name]  . Number of children: 3  . Years of education: none  . Highest education level: Not on file  Occupational History  . 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 Financial resource strain: Not on file  . Food insecurity:    Worry: Not on file    Inability: Not on file  . Transportation needs:    Medical: Not on file    Non-medical: Not on file  Tobacco Use  . Smoking status: Never Smoker  . Smokeless tobacco: Never Used  Substance and Sexual Activity  . Alcohol use: No    Alcohol/week: 0.0 oz  . Drug use: No  . Sexual activity: Yes    Partners: Male    Birth control/protection: Surgical    Comment: Tubal  Lifestyle  . Physical activity:    Days per week: Not on file    Minutes per session: Not on file  . Stress: Not on file  Relationships  . Social connections:    Talks on phone: Not on file    Gets together: Not on file    Attends religious service: Not on file    Active member of club or organization: Not on file    Attends meetings of clubs or organizations:  Not on file    Relationship status: Not on file  . Intimate partner violence:    Fear of current or ex partner: Not on file    Emotionally abused: Not on file    Physically abused: Not on file    Forced sexual activity: Not on file  Other Topics Concern  . 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      Married in 1983 and has 3 grown children, 2 boys and one girl.       Lives with husband and two boys. Daughter lives locally.   Smoke detectors in home? Yes    Guns in home? No    Consistent seatbelt use? Yes    Consistent dental brushing and flossing? Yes      Semi-Annual dental visits: No    Annual Eye exams: No           Objective:   PHYSICAL EXAM BP 104/60   Pulse 96   Temp 99 F (37.2 C) (Oral)   Resp 18   Ht 5\' 4"  (1.626 m)   Wt 168 lb (76.2 kg)   SpO2 96%   BMI 28.84 kg/m   Physical Exam  Constitutional: She is oriented to person, place, and time. She appears well-developed and well-nourished. No distress.  HENT:  Head: Normocephalic and atraumatic.  Mouth/Throat: Uvula is midline, oropharynx is clear and moist and mucous membranes are normal.  Eyes: Pupils are equal, round, and reactive to light. Conjunctivae and EOM are normal.  Neck: Normal range of motion.  Cardiovascular: Normal rate, regular rhythm, normal heart sounds and intact distal pulses.  Pulmonary/Chest: Effort normal and breath sounds normal. She has no wheezes. She has no rhonchi. She has no rales.  Abdominal: Soft. Normal appearance and bowel sounds are normal. There is no tenderness.  Musculoskeletal:       Right lower leg: She exhibits no swelling.       Left lower leg: She exhibits no swelling.  Neurological: [name], place, and time.  Skin: Skin is warm and dry.  Psychiatric: She has a normal mood and affect.  Vitals reviewed.     Results for orders placed or performed in visit on 01/02/18 (from the past 24 hour(s))  POCT urinalysis dipstick     Status: None   Collection Time: 01/02/18  1:58 PM  Result Value Ref Range   Color, UA yellow yellow   Clarity, UA clear clear   Glucose, UA negative negative mg/dL   Bilirubin, UA negative negative   Ketones, POC UA negative negative mg/dL   Spec Grav, UA 1.025 1.010 - 1.025   Blood, UA negative negative   pH, UA 5.5 5.0 - 8.0   Protein Ur, POC negative negative mg/dL   Urobilinogen, UA 0.2 0.2 or 1.0 E.U./dL   Nitrite, UA Negative Negative   Leukocytes, UA Negative Negative  POCT glucose (manual entry)     Status: Abnormal   Collection Time: 01/02/18  2:00 PM   Result Value Ref Range   POC Glucose 120 (A) 70 - 99 mg/dl   Wt Readings from Last 3 Encounters:  01/02/18 168 lb (76.2 kg)  12/13/17 166 lb 9.6 oz (75.6 kg)  09/19/17 163 lb 9.6 oz (74.2 kg)    Assessment and Plan :  1. Uncontrolled type 2 diabetes mellitus without complication, with long-term current  use of insulin (HCC) Pt is not checking sugars so it is hard to determine how controlled she has been over the past few weeks.  Point-of-care glucose here is reassuring at 120 (it was 321 at last OV, glucosuria has also resolved since last OV, which is reassuring).  She is responding to and tolerating Trulicity well.  Discussed importance of monitoring glucose outside office.  Discussed different options for monitoring glucose.  Patient agrees to use freestyle libre 14-day monitor again.  Encouraged her to check blood sugar 4 times a day (fasting, after two largest meals, and at bedtime).  Bring those readings to follow-up visit.  Continue working on healthy lifestyle changes.  Strongly encouraged at least 10 to 15 minutes of walking daily and increase as tolerated.  Given educational material on diabetes and nutrition.  Discussed potential complications of uncontrolled diabetes.  Follow-up in 3 months. - POCT urinalysis dipstick - POCT glucose (manual entry) - Continuous Blood Gluc Sensor (FREESTYLE LIBRE 14 DAY SENSOR) MISC; 1 Units by Does not apply route every 14 (fourteen) days.  Dispense: 6 each; Refill: 1 - fluconazole (DIFLUCAN) 150 MG tablet; Take 1 tablet (150 mg total) by mouth once a week. Repeat if needed  Dispense: 2 tablet; Refill: 0 - Dulaglutide (TRULICITY) 2.26 JF/3.5KT SOPN; Inject 0.75 mg into the skin once a week.  Dispense: 4 pen; Refill: 0 - Insulin Glargine (LANTUS SOLOSTAR) 100 UNIT/ML Solostar Pen; ADMINISTER 50 UNITS UNDER THE SKIN DAILY  Dispense: 15 mL; Refill: 0 2. Overweight (BMI 25.0-29.9) 3. Sedentary lifestyle See above. 3. Glucosuria Resolved  Side effects,  risks, benefits, and alternatives of the medications and treatment plan prescribed today were discussed, and patient expressed understanding of the instructions given. No barriers to understanding were identified. Red flags discussed in detail. Pt expressed understanding regarding what to do in case of emergency/urgent symptoms.  A total of 25 minutes was spent in the room with the patient, greater than 50% of which was in counseling/coordination of care regarding uncontrolled T2DM.   Tenna Delaine, PA-C  [facility] 01/02/2018 2:02 PM

## 2018-01-03 ENCOUNTER — Encounter: Payer: Self-pay | Admitting: Physician Assistant

## 2018-01-03 DIAGNOSIS — Z794 Long term (current) use of insulin: Principal | ICD-10-CM

## 2018-01-03 DIAGNOSIS — E1165 Type 2 diabetes mellitus with hyperglycemia: Principal | ICD-10-CM

## 2018-01-03 DIAGNOSIS — IMO0001 Reserved for inherently not codable concepts without codable children: Secondary | ICD-10-CM | POA: Insufficient documentation

## 2018-01-16 ENCOUNTER — Other Ambulatory Visit: Payer: Self-pay | Admitting: Physician Assistant

## 2018-01-17 NOTE — Telephone Encounter (Signed)
Please/adviserefill: FREESTYLE LIBRE READER

## 2018-01-25 ENCOUNTER — Other Ambulatory Visit: Payer: Self-pay | Admitting: Physician Assistant

## 2018-01-25 DIAGNOSIS — Z794 Long term (current) use of insulin: Principal | ICD-10-CM

## 2018-01-25 DIAGNOSIS — E1165 Type 2 diabetes mellitus with hyperglycemia: Principal | ICD-10-CM

## 2018-01-25 DIAGNOSIS — IMO0001 Reserved for inherently not codable concepts without codable children: Secondary | ICD-10-CM

## 2018-01-30 ENCOUNTER — Other Ambulatory Visit: Payer: Self-pay | Admitting: Physician Assistant

## 2018-01-30 DIAGNOSIS — Z794 Long term (current) use of insulin: Principal | ICD-10-CM

## 2018-01-30 DIAGNOSIS — IMO0001 Reserved for inherently not codable concepts without codable children: Secondary | ICD-10-CM

## 2018-01-30 DIAGNOSIS — E1165 Type 2 diabetes mellitus with hyperglycemia: Principal | ICD-10-CM

## 2018-02-07 ENCOUNTER — Ambulatory Visit: Payer: BLUE CROSS/BLUE SHIELD | Admitting: Physician Assistant

## 2018-02-08 ENCOUNTER — Ambulatory Visit: Payer: BLUE CROSS/BLUE SHIELD | Admitting: Physician Assistant

## 2018-02-19 ENCOUNTER — Other Ambulatory Visit: Payer: Self-pay | Admitting: Physician Assistant

## 2018-02-19 DIAGNOSIS — E1165 Type 2 diabetes mellitus with hyperglycemia: Principal | ICD-10-CM

## 2018-02-19 DIAGNOSIS — Z794 Long term (current) use of insulin: Principal | ICD-10-CM

## 2018-02-19 DIAGNOSIS — IMO0001 Reserved for inherently not codable concepts without codable children: Secondary | ICD-10-CM

## 2018-02-20 NOTE — Telephone Encounter (Signed)
lantus solostar pen refill Last Refill:01/25/18 # one 15 mls Last OV: 01/02/18 PCP: Tenna Delaine Pharmacy:[facility] [phone] NOV  04/04/18

## 2018-02-24 ENCOUNTER — Other Ambulatory Visit: Payer: Self-pay | Admitting: Physician Assistant

## 2018-02-24 DIAGNOSIS — E1165 Type 2 diabetes mellitus with hyperglycemia: Principal | ICD-10-CM

## 2018-02-24 DIAGNOSIS — IMO0001 Reserved for inherently not codable concepts without codable children: Secondary | ICD-10-CM

## 2018-02-24 DIAGNOSIS — Z794 Long term (current) use of insulin: Principal | ICD-10-CM

## 2018-03-18 ENCOUNTER — Other Ambulatory Visit: Payer: Self-pay | Admitting: Physician Assistant

## 2018-03-18 DIAGNOSIS — IMO0001 Reserved for inherently not codable concepts without codable children: Secondary | ICD-10-CM

## 2018-03-18 DIAGNOSIS — Z794 Long term (current) use of insulin: Principal | ICD-10-CM

## 2018-03-18 DIAGNOSIS — E1165 Type 2 diabetes mellitus with hyperglycemia: Principal | ICD-10-CM

## 2018-03-19 NOTE — Telephone Encounter (Signed)
lantus pen refill Last Refill:02/20/18 # 1 pen Last OV: 01/02/18 PCP: [facility] Pharmacy:[facility] # [phone]

## 2018-03-24 ENCOUNTER — Other Ambulatory Visit: Payer: Self-pay | Admitting: Physician Assistant

## 2018-03-24 DIAGNOSIS — Z794 Long term (current) use of insulin: Principal | ICD-10-CM

## 2018-03-24 DIAGNOSIS — IMO0001 Reserved for inherently not codable concepts without codable children: Secondary | ICD-10-CM

## 2018-03-24 DIAGNOSIS — E1165 Type 2 diabetes mellitus with hyperglycemia: Principal | ICD-10-CM

## 2018-03-26 NOTE — Telephone Encounter (Signed)
Requested medication (s) are due for refill today: Yes  Requested medication (s) are on the active medication list: Yes  Last refill:  02/26/18  Future visit scheduled: Yes  Notes to clinic:  Unable to refill per protocol, HgbA1C out or range.     Requested Prescriptions  Pending Prescriptions Disp Refills   TRULICITY 3.35 KT/6.2BW SOPN [Pharmacy Med Name: TRULICITY 0.75MG /0.5ML SDP 4X0.5ML] 2 mL 0    Sig: INJECT 0.5 ML( 0.75 MG) UNDER THE SKIN ONCE A WEEK     Endocrinology:  Diabetes - GLP-1 Receptor Agonists Failed - 03/24/2018 10:45 AM      Failed - HBA1C is between 0 and 7.9 and within 180 days    Hemoglobin A1C  Date Value Ref Range Status  12/13/2017 9.4 (A) 4.0 - 5.6 % Final   Hgb A1c MFr Bld  Date Value Ref Range Status  09/19/2017 8.1 (H) 4.8 - 5.6 % Final    Comment:             Prediabetes: 5.7 - 6.4          Diabetes: >6.4          Glycemic control for adults with diabetes: <7.0          Passed - Valid encounter within last 6 months    Recent Outpatient Visits          2 months ago Uncontrolled type 2 diabetes mellitus without complication, with long-term current use of insulin ([place])   Primary Care at [place], [place] D, PA-C   3 months ago Diabetes mellitus type 2, uncontrolled, without complications ([place])   Primary Care at [place], [place] D, PA-C   6 months ago Diabetes mellitus type 2, uncontrolled, without complications ([place])   Primary Care at [facility], [place], [place]   6 months ago Flatulence, eructation and gas pain   Primary Care at [place], [place] D, PA-C   7 months ago Chronic bilateral low back pain without sciatica   Primary Care at [facility], Re[place] Laurence, PA-C      Future Appointments            In 1 week Leonie Douglas, PA-C Primary Care at [place], [facility]

## 2018-04-04 ENCOUNTER — Ambulatory Visit: Payer: BLUE CROSS/BLUE SHIELD | Admitting: Physician Assistant

## 2018-04-16 ENCOUNTER — Other Ambulatory Visit: Payer: Self-pay | Admitting: Physician Assistant

## 2018-04-16 DIAGNOSIS — E1165 Type 2 diabetes mellitus with hyperglycemia: Principal | ICD-10-CM

## 2018-04-16 DIAGNOSIS — Z794 Long term (current) use of insulin: Principal | ICD-10-CM

## 2018-04-16 DIAGNOSIS — IMO0001 Reserved for inherently not codable concepts without codable children: Secondary | ICD-10-CM

## 2018-04-17 NOTE — Telephone Encounter (Signed)
Requested Prescriptions  Pending Prescriptions Disp Refills  . Insulin Glargine (LANTUS SOLOSTAR) 100 UNIT/ML Solostar Pen [Pharmacy Med Name: LANTUS SOLOSTAR PEN INJ 3ML] 15 mL 0    Sig: ADMINISTER 50 UNITS UNDER THE SKIN DAILY     Endocrinology:  Diabetes - Insulins Failed - 04/16/2018 12:31 PM      Failed - HBA1C is between 0 and 7.9 and within 180 days    Hemoglobin A1C  Date Value Ref Range Status  12/13/2017 9.4 (A) 4.0 - 5.6 % Final   Hgb A1c MFr Bld  Date Value Ref Range Status  09/19/2017 8.1 (H) 4.8 - 5.6 % Final    Comment:             Prediabetes: 5.7 - 6.4          Diabetes: >6.4          Glycemic control for adults with diabetes: <7.0          Passed - Valid encounter within last 6 months    Recent Outpatient Visits          3 months ago Uncontrolled type 2 diabetes mellitus without complication, with long-term current use of insulin ([place])   Primary Care at [place], [place] D, PA-C   4 months ago Diabetes mellitus type 2, uncontrolled, without complications ([place])   Primary Care at [place], [place] D, PA-C   7 months ago Diabetes mellitus type 2, uncontrolled, without complications ([place])   Primary Care at [facility], [place], [place]   7 months ago Flatulence, eructation and gas pain   Primary Care at [place], [place] D, PA-C   8 months ago Chronic bilateral low back pain without sciatica   [facility] at [facility], Reather Laurence, PA-C      Future Appointments            In 2 days Leonie Douglas, PA-C Primary Care at [place], [facility]

## 2018-04-19 ENCOUNTER — Other Ambulatory Visit: Payer: Self-pay

## 2018-04-19 ENCOUNTER — Ambulatory Visit (INDEPENDENT_AMBULATORY_CARE_PROVIDER_SITE_OTHER): Payer: BLUE CROSS/BLUE SHIELD | Admitting: Physician Assistant

## 2018-04-19 ENCOUNTER — Encounter: Payer: Self-pay | Admitting: Physician Assistant

## 2018-04-19 VITALS — BP 96/60 | HR 78 | Temp 98.4°F | Resp 18 | Ht 63.58 in | Wt 166.2 lb

## 2018-04-19 DIAGNOSIS — IMO0001 Reserved for inherently not codable concepts without codable children: Secondary | ICD-10-CM

## 2018-04-19 DIAGNOSIS — E1165 Type 2 diabetes mellitus with hyperglycemia: Secondary | ICD-10-CM | POA: Diagnosis not present

## 2018-04-19 DIAGNOSIS — Z794 Long term (current) use of insulin: Secondary | ICD-10-CM | POA: Diagnosis not present

## 2018-04-19 LAB — POCT URINALYSIS DIP (MANUAL ENTRY)
Bilirubin, UA: NEGATIVE
Blood, UA: NEGATIVE
GLUCOSE UA: NEGATIVE mg/dL
Ketones, POC UA: NEGATIVE mg/dL
Leukocytes, UA: NEGATIVE
NITRITE UA: NEGATIVE
Protein Ur, POC: NEGATIVE mg/dL
Spec Grav, UA: 1.02 (ref 1.010–1.025)
UROBILINOGEN UA: 0.2 U/dL
pH, UA: 5.5 (ref 5.0–8.0)

## 2018-04-19 LAB — POCT GLYCOSYLATED HEMOGLOBIN (HGB A1C): Hemoglobin A1C: 7.1 % — AB (ref 4.0–5.6)

## 2018-04-19 MED ORDER — DULAGLUTIDE 0.75 MG/0.5ML ~~LOC~~ SOAJ: SUBCUTANEOUS | 3 refills | Status: DC

## 2018-04-19 NOTE — Progress Notes (Signed)
MRN: [id number]  Subjective:   Shelley Escobar is a 47 y.o. female who presents for follow up of Type 2 diabetes mellitus. Patient is currently managed with Continued lantus 50 units qhs, trulicity 4.92EF/EOFH, metformin 1040m BID which has been effective. Admits excellent compliance. Denies adverse effects including metallic taste, hypoglycemia, nausea, vomiting. Patient is checking home blood sugars. Home blood sugar records: 90-160. Current symptoms include none. Patient denies foot ulcerations, nausea, paresthesia of the feet, polydipsia, polyuria, visual disturbances and vomiting. Patient is checking their feet daily. No foot concerns. Last diabetic eye exam eye exam: 06/2017.  No changes in diet and exercise. Up to date on vaccinations. Taking lopid 60463mdaily for HLD and lisinopril 2.63m68maily for kidney protection.     Patient Active Problem List   Diagnosis Date Noted  . Uncontrolled type 2 diabetes mellitus without complication, with long-term current use of insulin ([place]CCNixa7/17/2019  . Fatty infiltration of liver 03/10/2016  . Anemia 03/10/2016  . Obesity (BMI 30.0-34.9) 01/27/2012  . Dyslipidemia 07/02/2011   Social History   Socioeconomic History  . Marital status: Married    Spouse name: VanLucianne Lei Number of children: 3  . Years of education: none  . Highest education level: Not on file  Occupational History  . Occupation: salScientist, clinical (histocompatibility and immunogenetics)ELF EMPLOYED    Comment: self-employed ([employer])  Social Needs  . Financial resource strain: Not on file  . Food insecurity:    Worry: Not on file    Inability: Not on file  . Transportation needs:    Medical: Not on file    Non-medical: Not on file  Tobacco Use  . Smoking status: Never Smoker  . Smokeless tobacco: Never Used  Substance and Sexual Activity  . Alcohol use: No    Alcohol/week: 0.0 standard drinks  . Drug use: No  . Sexual activity: Yes    Partners: Male    Birth control/protection: Surgical     Comment: Tubal  Lifestyle  . Physical activity:    Days per week: Not on file    Minutes per session: Not on file  . Stress: Not on file  Relationships  . Social connections:    Talks on phone: Not on file    Gets together: Not on file    Attends religious service: Not on file    Active member of club or organization: Not on file    Attends meetings of clubs or organizations: Not on file    Relationship status: Not on file  . Intimate partner violence:    Fear of current or ex partner: Not on file    Emotionally abused: Not on file    Physically abused: Not on file    Forced sexual activity: Not on file  Other Topics Concern  . 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      Married in 1983 and has 3 grown children, 2 boys and one girl.       Lives with husband and two boys. Daughter lives locally.   Smoke detectors in home? Yes    Guns in home? No    Consistent seatbelt use? Yes    Consistent dental brushing and flossing? Yes    Semi-Annual dental visits: No    Annual Eye exams: No         Objective:  PHYSICAL EXAM BP 96/60   Pulse 78   Temp 98.4 F (36.9 C) (Oral)   Resp 18   Ht 5' 3.58" (1.615 m)   Wt 166 lb 3.2 oz (75.4 kg)   SpO2 99%   BMI 28.90 kg/m   Physical Exam  Constitutional: She is oriented to person, place, and time. She appears well-developed and well-nourished. No distress.  HENT:  Head: Normocephalic and atraumatic.  Mouth/Throat: Uvula is midline, oropharynx is clear and moist and mucous membranes are normal.  Eyes: Pupils are equal, round, and reactive to light. Conjunctivae and EOM are normal.  Neck: Normal range of motion.  Cardiovascular: Normal rate, regular rhythm, normal heart sounds and intact distal pulses.  Pulmonary/Chest: Effort normal and breath sounds normal. She has no wheezes. She has no rhonchi. She has no rales.    Musculoskeletal:       Right lower leg: She exhibits no swelling.       Left lower leg: She exhibits no swelling.  Neurological: [name], place, and time.  Skin: Skin is warm and dry.  Psychiatric: She has a normal mood and affect.  Vitals reviewed.   Results for orders placed or performed in visit on 04/19/18 (from the past 24 hour(s))  POCT urinalysis dipstick     Status: None   Collection Time: 04/19/18 10:14 AM  Result Value Ref Range   Color, UA yellow yellow   Clarity, UA clear clear   Glucose, UA negative negative mg/dL   Bilirubin, UA negative negative   Ketones, POC UA negative negative mg/dL   Spec Grav, UA 1.020 1.010 - 1.025   Blood, UA negative negative   pH, UA 5.5 5.0 - 8.0   Protein Ur, POC negative negative mg/dL   Urobilinogen, UA 0.2 0.2 or 1.0 E.U./dL   Nitrite, UA Negative Negative   Leukocytes, UA Negative Negative  POCT glycosylated hemoglobin (Hb A1C)     Status: Abnormal   Collection Time: 04/19/18 10:18 AM  Result Value Ref Range   Hemoglobin A1C 7.1 (A) 4.0 - 5.6 %   HbA1c POC (<> result, manual entry)     HbA1c, POC (prediabetic range)     HbA1c, POC (controlled diabetic range)     Wt Readings from Last 3 Encounters:  04/19/18 166 lb 3.2 oz (75.4 kg)  01/02/18 168 lb (76.2 kg)  12/13/17 166 lb 9.6 oz (75.6 kg)     Assessment and Plan :  1. Uncontrolled type 2 diabetes mellitus without complication, with long-term current use of insulin (HCC) A1C has decreased from 9.4 four months ago to 7.1 today. Rec cont med regimen as prescribed. May decrease lantus dose if morning FBS drops below 70. F/u in 3 months.  - CBC with Differential/Platelet - CMP14+EGFR - POCT glycosylated hemoglobin (Hb A1C) - POCT urinalysis dipstick - TSH - Dulaglutide (TRULICITY) 3.01 SW/1.0XN SOPN; INJECT 0.5 ML( 0.75 MG) UNDER THE SKIN ONCE A WEEK  Dispense: 2 mL; Refill: [place], PA-C  Primary Care at [place] 04/19/2018 10:28 AM

## 2018-04-19 NOTE — Patient Instructions (Addendum)
  Continue medications as you are.  Check your sugars when you are fastin gin the morning before a meal. Your goal is 70-100. If you drop below 70 over the next two weeks, decrease night time lantus to 45 units. Signs of hypoglycemia are lightheadedness, blurred vision, headache, and confusion.   Follow up in 3 months.   If you have lab work done today you will be contacted with your lab results within the next 2 weeks.  If you have not heard from [place] then please contact us. The fastest way to get your results is to register for My Chart.   IF you received an x-ray today, you will receive an invoice from [facility]. Please contact [facility] at [phone] with questions or concerns regarding your invoice.   IF you received labwork today, you will receive an invoice from [facility]. Please contact [facility] at [phone] with questions or concerns regarding your invoice.   Our billing staff will not be able to assist you with questions regarding bills from these companies.  You will be contacted with the lab results as soon as they are available. The fastest way to get your results is to activate your My Chart account. Instructions are located on the last page of this paperwork. If you have not heard from [place] regarding the results in 2 weeks, please contact this office.

## 2018-04-20 ENCOUNTER — Other Ambulatory Visit: Payer: Self-pay | Admitting: Physician Assistant

## 2018-04-20 DIAGNOSIS — D649 Anemia, unspecified: Secondary | ICD-10-CM

## 2018-04-20 LAB — CMP14+EGFR
ALK PHOS: 56 IU/L (ref 39–117)
ALT: 30 IU/L (ref 0–32)
AST: 36 IU/L (ref 0–40)
Albumin/Globulin Ratio: 1.5 (ref 1.2–2.2)
Albumin: 4.7 g/dL (ref 3.5–5.5)
BUN/Creatinine Ratio: 20 (ref 9–23)
BUN: 15 mg/dL (ref 6–24)
Bilirubin Total: 0.3 mg/dL (ref 0.0–1.2)
CALCIUM: 10.1 mg/dL (ref 8.7–10.2)
CO2: 21 mmol/L (ref 20–29)
CREATININE: 0.75 mg/dL (ref 0.57–1.00)
Chloride: 103 mmol/L (ref 96–106)
GFR calc Af Amer: 108 mL/min/{1.73_m2} (ref >59)
GFR calc non Af Amer: 94 mL/min/{1.73_m2} (ref >59)
GLOBULIN, TOTAL: 3.1 g/dL (ref 1.5–4.5)
GLUCOSE: 88 mg/dL (ref 65–99)
POTASSIUM: 5 mmol/L (ref 3.5–5.2)
SODIUM: 139 mmol/L (ref 134–144)
Total Protein: 7.8 g/dL (ref 6.0–8.5)

## 2018-04-20 LAB — CBC WITH DIFFERENTIAL/PLATELET
Basophils Absolute: 0.1 10*3/uL (ref 0.0–0.2)
Basos: 1 %
EOS (ABSOLUTE): 0.3 10*3/uL (ref 0.0–0.4)
Eos: 4 %
Hematocrit: 31.2 % — ABNORMAL LOW (ref 34.0–46.6)
Hemoglobin: 10 g/dL — ABNORMAL LOW (ref 11.1–15.9)
IMMATURE GRANULOCYTES: 0 %
Immature Grans (Abs): 0 10*3/uL (ref 0.0–0.1)
LYMPHS ABS: 2.4 10*3/uL (ref 0.7–3.1)
Lymphs: 31 %
MCH: 24.9 pg — ABNORMAL LOW (ref 26.6–33.0)
MCHC: 32.1 g/dL (ref 31.5–35.7)
MCV: 78 fL — ABNORMAL LOW (ref 79–97)
MONOS ABS: 0.6 10*3/uL (ref 0.1–0.9)
Monocytes: 8 %
NEUTROS PCT: 56 %
Neutrophils Absolute: 4.4 10*3/uL (ref 1.4–7.0)
Platelets: 390 10*3/uL (ref 150–450)
RBC: 4.01 x10E6/uL (ref 3.77–5.28)
RDW: 12.4 % (ref 12.3–15.4)
WBC: 7.9 10*3/uL (ref 3.4–10.8)

## 2018-04-20 LAB — TSH: TSH: 0.726 u[IU]/mL (ref 0.450–4.500)

## 2018-04-20 NOTE — Progress Notes (Signed)
Orders Placed This Encounter  Procedures  . Ambulatory referral to Hematology    Referral Priority:   Routine    Referral Type:   Consultation    Referral Reason:   Specialty Services Required    Requested Specialty:   Oncology    Number of Visits Requested:   1

## 2018-04-22 ENCOUNTER — Encounter: Payer: Self-pay | Admitting: Physician Assistant

## 2018-04-27 ENCOUNTER — Telehealth: Payer: Self-pay | Admitting: Family

## 2018-04-27 NOTE — Telephone Encounter (Signed)
Spoke with patient son to confirm new patient appt 11/25 at 11 am

## 2018-05-10 ENCOUNTER — Telehealth: Payer: Self-pay | Admitting: Family

## 2018-05-10 NOTE — Telephone Encounter (Signed)
Received call from pt son to r/s lab/new patient appt 11/25 to 12/4 at 0830. Called language line to inform of new appt date/time as well

## 2018-05-14 ENCOUNTER — Ambulatory Visit: Payer: BLUE CROSS/BLUE SHIELD | Admitting: Family

## 2018-05-14 ENCOUNTER — Other Ambulatory Visit: Payer: BLUE CROSS/BLUE SHIELD

## 2018-05-17 ENCOUNTER — Other Ambulatory Visit: Payer: Self-pay | Admitting: Physician Assistant

## 2018-05-17 DIAGNOSIS — Z794 Long term (current) use of insulin: Principal | ICD-10-CM

## 2018-05-17 DIAGNOSIS — IMO0001 Reserved for inherently not codable concepts without codable children: Secondary | ICD-10-CM

## 2018-05-17 DIAGNOSIS — E1165 Type 2 diabetes mellitus with hyperglycemia: Principal | ICD-10-CM

## 2018-05-18 NOTE — Telephone Encounter (Signed)
Requested Prescriptions  Pending Prescriptions Disp Refills  . Insulin Glargine (LANTUS SOLOSTAR) 100 UNIT/ML Solostar Pen 15 mL 1    Sig: ADMINISTER 50 UNITS UNDER THE SKIN DAILY     Endocrinology:  Diabetes - Insulins Passed - 05/17/2018 10:24 AM      Passed - HBA1C is between 0 and 7.9 and within 180 days    Hemoglobin A1C  Date Value Ref Range Status  04/19/2018 7.1 (A) 4.0 - 5.6 % Final   Hgb A1c MFr Bld  Date Value Ref Range Status  09/19/2017 8.1 (H) 4.8 - 5.6 % Final    Comment:             Prediabetes: 5.7 - 6.4          Diabetes: >6.4          Glycemic control for adults with diabetes: <7.0          Passed - Valid encounter within last 6 months    Recent Outpatient Visits          4 weeks ago Uncontrolled type 2 diabetes mellitus without complication, with long-term current use of insulin ([place])   Primary Care at [place], [place] D, PA-C   4 months ago Uncontrolled type 2 diabetes mellitus without complication, with long-term current use of insulin ([place])   Primary Care at [place], [place] D, PA-C   5 months ago Diabetes mellitus type 2, uncontrolled, without complications [facility])   Primary Care at [place], [place] D, PA-C   8 months ago Diabetes mellitus type 2, uncontrolled, without complications ([place])   Primary Care at [facility], [place], [place]   8 months ago Flatulence, eructation and gas pain   Primary Care at [place], [place] D, [place]

## 2018-05-21 ENCOUNTER — Other Ambulatory Visit: Payer: Self-pay | Admitting: Physician Assistant

## 2018-05-21 DIAGNOSIS — IMO0001 Reserved for inherently not codable concepts without codable children: Secondary | ICD-10-CM

## 2018-05-21 DIAGNOSIS — E1165 Type 2 diabetes mellitus with hyperglycemia: Principal | ICD-10-CM

## 2018-05-21 DIAGNOSIS — Z794 Long term (current) use of insulin: Principal | ICD-10-CM

## 2018-05-22 ENCOUNTER — Other Ambulatory Visit: Payer: Self-pay | Admitting: Family

## 2018-05-22 DIAGNOSIS — D649 Anemia, unspecified: Secondary | ICD-10-CM

## 2018-05-23 ENCOUNTER — Inpatient Hospital Stay: Payer: BLUE CROSS/BLUE SHIELD | Attending: Family

## 2018-05-23 ENCOUNTER — Inpatient Hospital Stay (HOSPITAL_BASED_OUTPATIENT_CLINIC_OR_DEPARTMENT_OTHER): Payer: BLUE CROSS/BLUE SHIELD | Admitting: Family

## 2018-05-23 VITALS — BP 119/69 | HR 86 | Temp 98.4°F | Resp 18 | Ht 63.5 in | Wt 167.8 lb

## 2018-05-23 DIAGNOSIS — Z794 Long term (current) use of insulin: Secondary | ICD-10-CM | POA: Insufficient documentation

## 2018-05-23 DIAGNOSIS — E119 Type 2 diabetes mellitus without complications: Secondary | ICD-10-CM | POA: Insufficient documentation

## 2018-05-23 DIAGNOSIS — D500 Iron deficiency anemia secondary to blood loss (chronic): Secondary | ICD-10-CM | POA: Insufficient documentation

## 2018-05-23 DIAGNOSIS — N920 Excessive and frequent menstruation with regular cycle: Secondary | ICD-10-CM | POA: Diagnosis not present

## 2018-05-23 DIAGNOSIS — N921 Excessive and frequent menstruation with irregular cycle: Secondary | ICD-10-CM

## 2018-05-23 DIAGNOSIS — D508 Other iron deficiency anemias: Secondary | ICD-10-CM

## 2018-05-23 DIAGNOSIS — D649 Anemia, unspecified: Secondary | ICD-10-CM

## 2018-05-23 DIAGNOSIS — D509 Iron deficiency anemia, unspecified: Secondary | ICD-10-CM | POA: Insufficient documentation

## 2018-05-23 DIAGNOSIS — E538 Deficiency of other specified B group vitamins: Secondary | ICD-10-CM

## 2018-05-23 LAB — CBC WITH DIFFERENTIAL (CANCER CENTER ONLY)
Abs Immature Granulocytes: 0.06 10*3/uL (ref 0.00–0.07)
BASOS ABS: 0 10*3/uL (ref 0.0–0.1)
Basophils Relative: 0 %
Eosinophils Absolute: 0.5 10*3/uL (ref 0.0–0.5)
Eosinophils Relative: 5 %
HCT: 34.2 % — ABNORMAL LOW (ref 36.0–46.0)
Hemoglobin: 10.5 g/dL — ABNORMAL LOW (ref 12.0–15.0)
Immature Granulocytes: 1 %
LYMPHS ABS: 1.8 10*3/uL (ref 0.7–4.0)
Lymphocytes Relative: 18 %
MCH: 24.8 pg — ABNORMAL LOW (ref 26.0–34.0)
MCHC: 30.7 g/dL (ref 30.0–36.0)
MCV: 80.7 fL (ref 80.0–100.0)
Monocytes Absolute: 0.7 10*3/uL (ref 0.1–1.0)
Monocytes Relative: 7 %
NEUTROS ABS: 6.9 10*3/uL (ref 1.7–7.7)
Neutrophils Relative %: 69 %
Platelet Count: 319 10*3/uL (ref 150–400)
RBC: 4.24 MIL/uL (ref 3.87–5.11)
RDW: 12.3 % (ref 11.5–15.5)
WBC Count: 10 10*3/uL (ref 4.0–10.5)
nRBC: 0 % (ref 0.0–0.2)

## 2018-05-23 LAB — IRON AND TIBC
Iron: 37 ug/dL — ABNORMAL LOW (ref 41–142)
Saturation Ratios: 6 % — ABNORMAL LOW (ref 21–57)
TIBC: 596 ug/dL — ABNORMAL HIGH (ref 236–444)
UIBC: 559 ug/dL — AB (ref 120–384)

## 2018-05-23 LAB — RETICULOCYTES
IMMATURE RETIC FRACT: 9.4 % (ref 2.3–15.9)
RBC.: 4.24 MIL/uL (ref 3.87–5.11)
Retic Count, Absolute: 78 10*3/uL (ref 19.0–186.0)
Retic Ct Pct: 1.8 % (ref 0.4–3.1)

## 2018-05-23 LAB — LACTATE DEHYDROGENASE: LDH: 163 U/L (ref 98–192)

## 2018-05-23 LAB — SAVE SMEAR(SSMR), FOR PROVIDER SLIDE REVIEW

## 2018-05-23 LAB — FERRITIN: Ferritin: 8 ng/mL — ABNORMAL LOW (ref 11–307)

## 2018-05-23 LAB — VITAMIN B12: Vitamin B-12: 638 pg/mL (ref 180–914)

## 2018-05-23 MED ORDER — FOLIC ACID 1 MG PO TABS: 1.0000 mg | ORAL_TABLET | Freq: Every day | ORAL | 11 refills | Status: DC

## 2018-05-23 MED ORDER — FUSION PLUS PO CAPS: 1.0000 | ORAL_CAPSULE | Freq: Every day | ORAL | 11 refills | Status: DC

## 2018-05-23 NOTE — Progress Notes (Signed)
Hematology/Oncology Consultation   Name: Shelley Escobar      MRN: [id number]    Location: Room/bed info not found  Date: 05/23/2018 Time:8:58 AM   REFERRING PHYSICIAN: [place] D. Timmothy Euler, PA-C  REASON FOR CONSULT: Anemia unspecified   DIAGNOSIS: No diagnosis found.  HISTORY OF PRESENT ILLNESS: Shelley Escobar is a very pleasant 47 yo [name] with long history of anemia. Our records go back to 2013 showing low Hgb. Today's level is 10.5 with an MCV of 80.7.  Ion saturation in April of this year was 31% an ferritin 19.  She has some fatigue at times.  She has had 2 episodes of SOB with chest pain that radiated into her left shoulder. This came and went quickly while she was brushing her teeth She has uncontrolled diabetes type II and is currently on Trulicity, Lantus and Glucophage. Hgb A1C in October was down to 7.1.  She had heavy vaginal bleeding for over 3 months in 2015 and underwent a D&C and IUD placement. She states that she still has a very heavy cycle that is irregular and comes every other month or so. She does not have a gynecologist at this time.   She has had no episodes of bleeding, no bruising or petechiae.  She has 3 adult children with no history of miscarriage.   She denies taking NSAIDS regularly. She occasionally takes a baby aspirin for back pain.  She does not have any family history. She was adopted in [place].  She has lived in the [place] for over 30 years.  No personal history of cancer.  Mammogram in April of this year was negative.  No colonoscopy record on file.  She has intermittent fluid fullned in the left supraclavicular area. She has had an [place] in the past which was negative. No swelling, tenderness, numbness or tingling in her extremities at this time.   No lymphadenopathy noted on exam.  She has maintained a good appetite and is staying well hydrated. She does not eat red meat at all and on occasionally has fish. She is mostly vegetarian.  She does not smoke or  drink alcoholic beverages.  She is active and enjoys walking for exercise.  No fever, chills, n/v, cough, rash, dizziness, palpitations, abdominal pain or changes in bowel or bladder habits.   ROS: All other 10 point review of systems is negative.   PAST MEDICAL HISTORY:   Past Medical History:  Diagnosis Date  . Acute appendicitis 01/13/2016  . Anemia    hx  . DM (diabetes mellitus) ([place])   . Hyperlipidemia   . SVD (spontaneous vaginal delivery)    x 3  . Vitamin D deficiency     ALLERGIES: No Known Allergies    MEDICATIONS:  Current Outpatient Medications on File Prior to Visit  Medication Sig Dispense Refill  . Continuous Blood Gluc Receiver (FREESTYLE LIBRE READER) DEVI USE AS DIRECTED 1 Device 0  . Continuous Blood Gluc Sensor (FREESTYLE LIBRE 14 DAY SENSOR) MISC 1 Units by Does not apply route every 14 (fourteen) days. 6 each 1  . Dulaglutide (TRULICITY) 5.46 EV/0.3JK SOPN INJECT 0.5 ML( 0.75 MG) UNDER THE SKIN ONCE A WEEK 2 mL 3  . gemfibrozil (LOPID) 600 MG tablet TAKE 1 TABLET(600 MG) BY MOUTH TWICE DAILY BEFORE A MEAL 180 tablet 3  . Insulin Glargine (LANTUS SOLOSTAR) 100 UNIT/ML Solostar Pen ADMINISTER 50 UNITS UNDER THE SKIN DAILY 15 mL 1  . Insulin Pen Needle (B-D UF III MINI PEN  NEEDLES) 31G X 5 MM MISC USE DAILY AS DIRECTED WITH INSULIN 100 each 3  . lisinopril (PRINIVIL,ZESTRIL) 2.5 MG tablet Take 1 tablet (2.5 mg total) by mouth daily. 90 tablet 3  . metFORMIN (GLUCOPHAGE) 1000 MG tablet TAKE 1 TABLET(1000 MG) BY MOUTH TWICE DAILY WITH A MEAL 180 tablet 3   No current facility-administered medications on file prior to visit.      PAST SURGICAL HISTORY Past Surgical History:  Procedure Laterality Date  . HYSTEROSCOPY W/D&C N/A 08/07/2013   Procedure: DILATATION AND CURETTAGE /HYSTEROSCOPY;  Surgeon: Azalia Bilis, MD;  Location: [place];  Service: Gynecology;  Laterality: N/A;  . INTRAUTERINE DEVICE (IUD) INSERTION  09/13/2013  . LAPAROSCOPIC APPENDECTOMY N/A  01/13/2016   Procedure: APPENDECTOMY LAPAROSCOPIC;  Surgeon: Armandina Gemma, MD;  Location: [facility];  Service: General;  Laterality: N/A;  . TUBAL LIGATION     Age 26    FAMILY HISTORY: Family History  Adopted: Yes  Family history unknown: Yes    SOCIAL HISTORY:  reports that she has never smoked. She has never used smokeless tobacco. She reports that she does not drink alcohol or use drugs.  PERFORMANCE STATUS: The patient's performance status is 1 - Symptomatic but completely ambulatory  PHYSICAL EXAM: Most Recent Vital Signs: There were no vitals taken for this visit. BP 119/69 (BP Location: Left Arm, Patient Position: Sitting)   Pulse 86   Temp 98.4 F (36.9 C) (Oral)   Resp 18   Ht 5' 3.5" (1.613 m)   Wt 167 lb 12.8 oz (76.1 kg)   SpO2 100%   BMI 29.26 kg/m   General Appearance:    Alert, cooperative, no distress, appears stated age  Head:    Normocephalic, without obvious abnormality, atraumatic  Eyes:    PERRL, conjunctiva/corneas clear, EOM's intact, fundi    benign, both eyes        Throat:   Lips, mucosa, and tongue normal; teeth and gums normal  Neck:   Supple, symmetrical, trachea midline, no adenopathy;    thyroid:  no enlargement/tenderness/nodules; no carotid   bruit or JVD  Back:     Symmetric, no curvature, ROM normal, no CVA tenderness  Lungs:     Clear to auscultation bilaterally, respirations unlabored  Chest Wall:    No tenderness or deformity   Heart:    Regular rate and rhythm, S1 and S2 normal, no murmur, rub   or gallop     Abdomen:     Soft, non-tender, bowel sounds active all four quadrants,    no masses, no organomegaly        Extremities:   Extremities normal, atraumatic, no cyanosis or edema  Pulses:   2+ and symmetric all extremities  Skin:   Skin color, texture, turgor normal, no rashes or lesions  Lymph nodes:   Cervical, supraclavicular, and axillary nodes normal  Neurologic:   CNII-XII intact, normal strength, sensation and reflexes     throughout    LABORATORY DATA:  No results found for this or any previous visit (from the past 48 hour(s)).    RADIOGRAPHY: No results found.     PATHOLOGY: None  ASSESSMENT/PLAN: Shelley Escobar is a very pleasant 47 yo [name] with long history of anemia dating all the way back to 2013 which is as far back as our records for her go. Today's level is 10.5 with an MCV of 80.7.  Hemoglobinopathy and iron studies along with erythropoietin are pending.  We will go  ahead and have her start taking folic acid 1 mg PO daily and Fusion plus iron supplement daily.  We will also refer her to Dr. Edwinna Areola with gynecology for routine follow-up and treatment if needed.  We will plan to see her back in another 4-5 months.   All questions were answered and she is in agreement with the plan. She will contact our office with any questions or concerns. We can certainly see her sooner if need be.   She was discussed with and also seen by Dr. Marin Olp and he is in agreement with the aforementioned.   Laverna Peace     Addendum: I saw and examined the patient with Judson Roch.  I agree with the above assessment.  She clearly is iron deficient.  When I looked at her blood smear under the microscope, she had microcytic red blood cells.  She has some hypochromic red blood cells.  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  Hemoglobin E is very common in [place].  Her iron studies showed a ferritin of only 8 with an iron saturation of 6%.  Her vitamin B12 was 638.  Hopefully, the folic acid which she does also need an oral iron will help get her blood better.  She cannot speak [place].  She came in with her son who is very fluent in [place].  Through him as the interpreter, she has a very good understanding of what is going on.  She agrees with our assessment.  We spent about 45 minutes with her today.  All the time spent face-to-face.  We helped coordinate further care for her  and again answered all of her questions.  Lattie Haw, MD

## 2018-05-24 ENCOUNTER — Telehealth: Payer: Self-pay | Admitting: Obstetrics and Gynecology

## 2018-05-24 LAB — ERYTHROPOIETIN: Erythropoietin: 20.5 m[IU]/mL — ABNORMAL HIGH (ref 2.6–18.5)

## 2018-05-24 NOTE — Telephone Encounter (Signed)
Called and left a message for patient to call back to schedule a new patient doctor referral appointment with our office to see any provider for: Heavy irregular cycles, seeing hematology for IV iron infusions for anemia. Has history of D&C and IUD.   Patient last saw Dr. Josefa Half on 03/27/14.

## 2018-05-25 LAB — HEMOGLOBINOPATHY EVALUATION
Hgb A2 Quant: 3.6 % — ABNORMAL HIGH (ref 1.8–3.2)
Hgb A: 71.7 % — ABNORMAL LOW (ref 96.4–98.8)
Hgb C: 0 %
Hgb F Quant: 0 % (ref 0.0–2.0)
Hgb S Quant: 0 %
Hgb Variant: 24.7 % — ABNORMAL HIGH

## 2018-05-29 ENCOUNTER — Ambulatory Visit: Payer: BLUE CROSS/BLUE SHIELD | Admitting: Obstetrics and Gynecology

## 2018-06-05 ENCOUNTER — Other Ambulatory Visit: Payer: Self-pay | Admitting: Family

## 2018-06-05 ENCOUNTER — Telehealth: Payer: Self-pay | Admitting: Family

## 2018-06-05 DIAGNOSIS — E538 Deficiency of other specified B group vitamins: Secondary | ICD-10-CM

## 2018-06-05 MED ORDER — FOLIC ACID 1 MG PO TABS: 2.0000 mg | ORAL_TABLET | Freq: Every day | ORAL | 11 refills | Status: DC

## 2018-06-05 NOTE — Telephone Encounter (Signed)
I spoke with Ms. Shelley Escobar's son and let him know she was positive for Hgb E. We will increase her folic acid to 2 mg PO daily. He verbalized understanding and new prescription was sent to her [facility]. All questions were answered at this time and we will plan to see her back in May 2020.

## 2018-06-14 ENCOUNTER — Other Ambulatory Visit (HOSPITAL_COMMUNITY)
Admission: RE | Admit: 2018-06-14 | Discharge: 2018-06-14 | Disposition: A | Discharge status: Home / Self Care | Payer: BLUE CROSS/BLUE SHIELD | Source: Ambulatory Visit | Attending: Obstetrics and Gynecology | Admitting: Obstetrics and Gynecology

## 2018-06-14 ENCOUNTER — Encounter: Payer: Self-pay | Admitting: Obstetrics and Gynecology

## 2018-06-14 ENCOUNTER — Ambulatory Visit (INDEPENDENT_AMBULATORY_CARE_PROVIDER_SITE_OTHER): Payer: BLUE CROSS/BLUE SHIELD | Admitting: Obstetrics and Gynecology

## 2018-06-14 ENCOUNTER — Other Ambulatory Visit: Payer: Self-pay

## 2018-06-14 VITALS — BP 110/62 | HR 76 | Resp 20 | Ht 63.5 in | Wt 166.4 lb

## 2018-06-14 DIAGNOSIS — N920 Excessive and frequent menstruation with regular cycle: Secondary | ICD-10-CM

## 2018-06-14 DIAGNOSIS — R300 Dysuria: Secondary | ICD-10-CM | POA: Diagnosis not present

## 2018-06-14 DIAGNOSIS — N951 Menopausal and female climacteric states: Secondary | ICD-10-CM | POA: Diagnosis not present

## 2018-06-14 DIAGNOSIS — Z01419 Encounter for gynecological examination (general) (routine) without abnormal findings: Secondary | ICD-10-CM | POA: Diagnosis present

## 2018-06-14 DIAGNOSIS — N852 Hypertrophy of uterus: Secondary | ICD-10-CM

## 2018-06-14 LAB — POCT URINALYSIS DIPSTICK
Bilirubin, UA: NEGATIVE
Blood, UA: NEGATIVE
CLARITY UA: NEGATIVE
Glucose, UA: NEGATIVE
KETONES UA: NEGATIVE
Leukocytes, UA: NEGATIVE
Nitrite, UA: NEGATIVE
Protein, UA: NEGATIVE
Urobilinogen, UA: 0.2 E.U./dL
pH, UA: 5 (ref 5.0–8.0)

## 2018-06-14 NOTE — Patient Instructions (Signed)

## 2018-06-14 NOTE — Progress Notes (Signed)
47 y.o. [id number] Married [place] female here for heavy cycles but skipping cycles also. Son is with her and states referred by Oncologist for anemia. Oncologist wants to see if patient still has IUD.  Has had 4 menses in the last 12 months.  Menses last 3 - 5 days.  Heavy flow per patient.  Does pad change every 30 minutes or up to every 2 hours.  No pain with menses.  No bleeding in between periods.   Some hot flashes and night sweats.   Hx Mirena IUD use in March 2015.  She presented for a vaginitis check in Sept 2015 and no IUD strings were seen.  She thought that something had passed into the toilet. Patient then had a pelvic US 03/27/14 showing no IUD in uterus.  Follow up abdominal x-ray showed no IUD in peritoneal cavity.   Hgb 10.5 on 05/23/18.  Low iron and ferritin.   TSH 0.726 on 04/19/18.  Has dysuria.  Wants urine check today.  Urine dip - negative.   PCP:  None  Patient's last menstrual period was 03/20/2018 (approximate).           Sexually active: No.  The current method of family planning is Tubal.    Exercising: No.  walking Smoker:  no  Health Maintenance: Pap: 06-07-16 Neg, 02-04-13 Neg:Neg HR HPV History of abnormal Pap:  no MMG: 09-18-17 3D Neg/density B/BiRads1 Colonoscopy:  n/a BMD:   n/a  Result  n/a TDaP:  09-21-09 Gardasil:   no HIV: 04-15-15 NR Hep C:n/a Screening Labs:  ----   reports that she has never smoked. She has never used smokeless tobacco. She reports that she does not drink alcohol or use drugs.  Past Medical History:  Diagnosis Date  . Acute appendicitis 01/13/2016  . Anemia    hx  . DM (diabetes mellitus) ([place])   . Hyperlipidemia   . SVD (spontaneous vaginal delivery)    x 3  . Vitamin D deficiency     Past Surgical History:  Procedure Laterality Date  . HYSTEROSCOPY W/D&C N/A 08/07/2013   Procedure: DILATATION AND CURETTAGE /HYSTEROSCOPY;  Surgeon: Azalia Bilis, MD;  Location: [facility];  Service: Gynecology;   Laterality: N/A;  . INTRAUTERINE DEVICE (IUD) INSERTION  09/13/2013  . LAPAROSCOPIC APPENDECTOMY N/A 01/13/2016   Procedure: APPENDECTOMY LAPAROSCOPIC;  Surgeon: Armandina Gemma, MD;  Location: [facility];  Service: General;  Laterality: N/A;  . TUBAL LIGATION     Age 47    Current Outpatient Medications  Medication Sig Dispense Refill  . Continuous Blood Gluc Receiver (FREESTYLE LIBRE READER) DEVI USE AS DIRECTED 1 Device 0  . Continuous Blood Gluc Sensor (FREESTYLE LIBRE 14 DAY SENSOR) MISC 1 Units by Does not apply route every 14 (fourteen) days. 6 each 1  . Dulaglutide (TRULICITY) 2.77 OE/4.2PN SOPN INJECT 0.5 ML( 0.75 MG) UNDER THE SKIN ONCE A WEEK 2 mL 3  . folic acid (FOLVITE) 1 MG tablet Take 2 tablets (2 mg total) by mouth daily. 60 tablet 11  . gemfibrozil (LOPID) 600 MG tablet TAKE 1 TABLET(600 MG) BY MOUTH TWICE DAILY BEFORE A MEAL 180 tablet 3  . Insulin Glargine (LANTUS SOLOSTAR) 100 UNIT/ML Solostar Pen ADMINISTER 50 UNITS UNDER THE SKIN DAILY 15 mL 1  . Insulin Pen Needle (B-D UF III MINI PEN NEEDLES) 31G X 5 MM MISC USE DAILY AS DIRECTED WITH INSULIN 100 each 3  . Iron-FA-B Cmp-C-Biot-Probiotic (FUSION PLUS) CAPS Take 1 tablet by mouth daily. 30 capsule  11  . lisinopril (PRINIVIL,ZESTRIL) 2.5 MG tablet Take 1 tablet (2.5 mg total) by mouth daily. 90 tablet 3  . metFORMIN (GLUCOPHAGE) 1000 MG tablet TAKE 1 TABLET(1000 MG) BY MOUTH TWICE DAILY WITH A MEAL 180 tablet 3   No current facility-administered medications for this visit.     Family History  Adopted: Yes  Family history unknown: Yes    Review of Systems  All other systems reviewed and are negative.   Exam:   BP 110/62 (BP Location: Right Arm, Patient Position: Sitting, Cuff Size: Normal)   Pulse 76   Resp 20   Ht 5' 3.5" (1.613 m)   Wt 166 lb 6.4 oz (75.5 kg)   LMP 03/20/2018 (Approximate)   BMI 29.01 kg/m     General appearance: alert, cooperative and appears stated age Head: Normocephalic, without obvious  abnormality, atraumatic Neck: no adenopathy, supple, symmetrical, trachea midline and thyroid normal to inspection and palpation Lungs: clear to auscultation bilaterally Breasts: normal appearance, no masses or tenderness, No nipple retraction or dimpling, No nipple discharge or bleeding, No axillary or supraclavicular adenopathy Heart: regular rate and rhythm Abdomen: soft, non-tender; no masses, no organomegaly Extremities: extremities normal, atraumatic, no cyanosis or edema Skin: Skin color, texture, turgor normal. No rashes or lesions Lymph nodes: Cervical, supraclavicular, and axillary nodes normal. No abnormal inguinal nodes palpated Neurologic: Grossly normal  Pelvic: External genitalia:  no lesions              Urethra:  normal appearing urethra with no masses, tenderness or lesions              Bartholins and Skenes: normal                 Vagina: normal appearing vagina with normal color and discharge, no lesions              Cervix: no lesions              Pap taken: Yes.   Bimanual Exam:  Uterus:   8 - 9 week size uterus.               Adnexa: no mass, fullness, tenderness              Rectal exam: Yes.  .  Confirms.              Anus:  normal sphincter tone, no lesions  Chaperone was present for exam.  Assessment:   Well woman visit with normal exam. Menorrhagia with irregular menses.  Skipped cycles.  Anemia.  Menopausal symptoms.   Status post hysteroscopy with dilation and curettage. Hx fibroids. Hx expelled Mirena IUD.  DM.  Plan: Mammogram screening. Recommended self breast awareness. Pap and HR HPV as above. Guidelines for Calcium, Vitamin D, regular exercise program including cardiovascular and weight bearing exercise. FSH, estradiol. Return for pelvic US.  Follow up annually and prn.   After visit summary provided.

## 2018-06-15 LAB — ESTRADIOL: Estradiol: 60.2 pg/mL

## 2018-06-15 LAB — FOLLICLE STIMULATING HORMONE: FSH: 13.7 m[IU]/mL

## 2018-06-18 ENCOUNTER — Telehealth: Payer: Self-pay | Admitting: Obstetrics and Gynecology

## 2018-06-18 LAB — CYTOLOGY - PAP
Diagnosis: NEGATIVE
HPV: NOT DETECTED

## 2018-06-18 NOTE — Telephone Encounter (Signed)
Call placed to review 2020 benefits for scheduled ultrasound appointment. Left voicemail message requesting a return call

## 2018-06-26 NOTE — Telephone Encounter (Signed)
Spoke with patient's son, Cassell Clement, he is listed on [employer]. Spoke with Mr Alfonse Spruce regarding benefit for for an ultraosund. Benefits were understood and Mr Alfonse Spruce was agreeable. Patient scheduled 06/28/2018 with Dr Quincy Simmonds. Patient aware of appointment date, arrival time and cancellation policy. No further questions. Will close encounter

## 2018-06-28 ENCOUNTER — Encounter: Payer: Self-pay | Admitting: Obstetrics and Gynecology

## 2018-06-28 ENCOUNTER — Other Ambulatory Visit: Payer: Self-pay

## 2018-06-28 ENCOUNTER — Ambulatory Visit (INDEPENDENT_AMBULATORY_CARE_PROVIDER_SITE_OTHER): Payer: BLUE CROSS/BLUE SHIELD

## 2018-06-28 ENCOUNTER — Ambulatory Visit (INDEPENDENT_AMBULATORY_CARE_PROVIDER_SITE_OTHER): Payer: BLUE CROSS/BLUE SHIELD | Admitting: Obstetrics and Gynecology

## 2018-06-28 VITALS — BP 110/70 | HR 70 | Ht 63.5 in | Wt 167.8 lb

## 2018-06-28 DIAGNOSIS — N920 Excessive and frequent menstruation with regular cycle: Secondary | ICD-10-CM

## 2018-06-28 DIAGNOSIS — N8000 Endometriosis of the uterus, unspecified: Secondary | ICD-10-CM

## 2018-06-28 DIAGNOSIS — D219 Benign neoplasm of connective and other soft tissue, unspecified: Secondary | ICD-10-CM

## 2018-06-28 DIAGNOSIS — R9389 Abnormal findings on diagnostic imaging of other specified body structures: Secondary | ICD-10-CM

## 2018-06-28 DIAGNOSIS — N852 Hypertrophy of uterus: Secondary | ICD-10-CM | POA: Diagnosis not present

## 2018-06-28 DIAGNOSIS — Z01812 Encounter for preprocedural laboratory examination: Secondary | ICD-10-CM | POA: Diagnosis not present

## 2018-06-28 DIAGNOSIS — N921 Excessive and frequent menstruation with irregular cycle: Secondary | ICD-10-CM | POA: Diagnosis not present

## 2018-06-28 LAB — POCT URINE PREGNANCY: Preg Test, Ur: NEGATIVE

## 2018-06-28 NOTE — Progress Notes (Signed)
GYNECOLOGY  VISIT   HPI: 48 y.o.   Married  [place]  female   [id number] with Patient's last menstrual period was 03/20/2018 (approximate).   here for pelvic ultrasound.    Interpretor present for the entire visit today.   Has anemia and heavy menses.  Pad change every 30 minutes. She is skipping her cycles.  Has hot flashes.   No bleeding since October.  Lab indicate she is perimenopausal.  Hgb 10.5 on 05/23/18.   Seeing Heme Onc.  Prior Mirena IUD use.  This was expelled.   Hx fibroids.   Prior hysteroscopy with dilation and curettage.  Normal TSH.   UPT negative.   GYNECOLOGIC HISTORY: Patient's last menstrual period was 03/20/2018 (approximate). Contraception: Tubal Menopausal hormone therapy:  none Last mammogram:  09-18-17 3D Neg/density B/BiRads1 Last pap smear: 06-14-18 Neg:Neg HR HPV                               06-07-16 Neg        OB History    Gravida  3   Para  3   Term  3   Preterm      AB      Living  3     SAB      TAB      Ectopic      Multiple      Live Births  3              Patient Active Problem List   Diagnosis Date Noted  . Iron deficiency anemia secondary to inadequate dietary iron intake 05/23/2018  . IDA (iron deficiency anemia) 05/23/2018  . Uncontrolled type 2 diabetes mellitus without complication, with long-term current use of insulin ([place]) 01/03/2018  . Fatty infiltration of liver 03/10/2016  . Anemia 03/10/2016  . Obesity (BMI 30.0-34.9) 01/27/2012  . Dyslipidemia 07/02/2011    Past Medical History:  Diagnosis Date  . Acute appendicitis 01/13/2016  . Anemia    hx  . DM (diabetes mellitus) ([place])   . Hyperlipidemia   . SVD (spontaneous vaginal delivery)    x 3  . Vitamin D deficiency     Past Surgical History:  Procedure Laterality Date  . HYSTEROSCOPY W/D&C N/A 08/07/2013   Procedure: DILATATION AND CURETTAGE /HYSTEROSCOPY;  Surgeon: Azalia Bilis, MD;  Location: [facility];  Service: Gynecology;   Laterality: N/A;  . INTRAUTERINE DEVICE (IUD) INSERTION  09/13/2013  . LAPAROSCOPIC APPENDECTOMY N/A 01/13/2016   Procedure: APPENDECTOMY LAPAROSCOPIC;  Surgeon: Armandina Gemma, MD;  Location: [facility];  Service: General;  Laterality: N/A;  . TUBAL LIGATION     Age 46    Current Outpatient Medications  Medication Sig Dispense Refill  . Continuous Blood Gluc Receiver (FREESTYLE LIBRE READER) DEVI USE AS DIRECTED 1 Device 0  . Continuous Blood Gluc Sensor (FREESTYLE LIBRE 14 DAY SENSOR) MISC 1 Units by Does not apply route every 14 (fourteen) days. 6 each 1  . Dulaglutide (TRULICITY) 8.25 KN/3.9JQ SOPN INJECT 0.5 ML( 0.75 MG) UNDER THE SKIN ONCE A WEEK 2 mL 3  . folic acid (FOLVITE) 1 MG tablet Take 2 tablets (2 mg total) by mouth daily. 60 tablet 11  . gemfibrozil (LOPID) 600 MG tablet TAKE 1 TABLET(600 MG) BY MOUTH TWICE DAILY BEFORE A MEAL 180 tablet 3  . Insulin Glargine (LANTUS SOLOSTAR) 100 UNIT/ML Solostar Pen ADMINISTER 50 UNITS UNDER THE SKIN DAILY 15 mL 1  .  Insulin Pen Needle (B-D UF III MINI PEN NEEDLES) 31G X 5 MM MISC USE DAILY AS DIRECTED WITH INSULIN 100 each 3  . Iron-FA-B Cmp-C-Biot-Probiotic (FUSION PLUS) CAPS Take 1 tablet by mouth daily. 30 capsule 11  . lisinopril (PRINIVIL,ZESTRIL) 2.5 MG tablet Take 1 tablet (2.5 mg total) by mouth daily. 90 tablet 3  . metFORMIN (GLUCOPHAGE) 1000 MG tablet TAKE 1 TABLET(1000 MG) BY MOUTH TWICE DAILY WITH A MEAL 180 tablet 3   No current facility-administered medications for this visit.      ALLERGIES: Patient has no known allergies.  Family History  Adopted: Yes  Family history unknown: Yes    Social History   Socioeconomic History  . Marital status: Married    Spouse name: [name]  . Number of children: 3  . Years of education: none  . Highest education level: Not on file  Occupational History  . 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 Financial  resource strain: Not on file  . Food insecurity:    Worry: Not on file    Inability: Not on file  . Transportation needs:    Medical: Not on file    Non-medical: Not on file  Tobacco Use  . Smoking status: Never Smoker  . Smokeless tobacco: Never Used  Substance and Sexual Activity  . Alcohol use: No    Alcohol/week: 0.0 standard drinks  . Drug use: No  . Sexual activity: Yes    Partners: Male    Birth control/protection: Surgical    Comment: Tubal  Lifestyle  . Physical activity:    Days per week: Not on file    Minutes per session: Not on file  . Stress: Not on file  Relationships  . Social connections:    Talks on phone: Not on file    Gets together: Not on file    Attends religious service: Not on file    Active member of club or organization: Not on file    Attends meetings of clubs or organizations: Not on file    Relationship status: Not on file  . Intimate partner violence:    Fear of current or ex partner: Not on file    Emotionally abused: Not on file    Physically abused: Not on file    Forced sexual activity: Not on file  Other Topics Concern  . 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      Married in 1983 and has 3 grown children, 2 boys and one girl.       Lives with husband and two boys. Daughter lives locally.   Smoke detectors in home? Yes    Guns in home? No    Consistent seatbelt use? Yes    Consistent dental brushing and flossing? Yes    Semi-Annual dental visits: No    Annual Eye exams: No         Review of Systems  All other systems reviewed and are negative.   PHYSICAL EXAMINATION:    BP 110/70 (BP Location: Right Arm, Patient Position: Sitting, Cuff Size: Normal)   Pulse 70   Ht 5' 3.5" (1.613 m)   Wt 167 lb 12.8 oz (76.1 kg)   LMP 03/20/2018 (Approximate)   BMI 29.26 kg/m     General appearance: alert, cooperative and appears  stated age  EMB: Consent for procedure. Sterile prep with  Hibiclens Paracervical block with 1% lidocaine 10 cc, lot number     [id number] , expiration 4/23 Tenaculum to anterior cervical lip. Pipelle passed to   6.5       cm twice.   Tissue to pathology.  Minimal EBL. No complications.     Pelvic: External genitalia:  no lesions              Urethra:  normal appearing urethra with no masses, tenderness or lesions              Bartholins and Skenes: normal                 Vagina: normal appearing vagina with normal color and discharge, no lesions              Cervix: no lesions                Bimanual Exam:  Uterus:  normal size, contour, position, consistency, mobility, non-tender              Adnexa: no mass, fullness, tenderness         Pelvic US Uterus with 15 mm posterior fibroid and potential adenomyosis.  EMS 10.01 mm.  Left ovary with 21 mm follicle.  Right ovary with 9 mm follicle. No free fluid.   Chaperone was present for exam.  ASSESSMENT  Menorrhagia with anemia.  Perimenopausal female.  Thickened endometrium.  Potential adenomyosis.  Small fibroid.   PLAN  We discussed her ultrasound findings and potential causes for heavy menstruation - polyp, adenomyosis, endometrial hyperplasia.  FU EMB.  Instructions and precautions given.  I discussed her potential for continued menses and heavy cycles.    An After Visit Summary was printed and given to the patient.  _25_____ minutes face to face time of which over 50% was spent in counseling.

## 2018-06-28 NOTE — Patient Instructions (Signed)

## 2018-06-29 ENCOUNTER — Other Ambulatory Visit: Payer: Self-pay | Admitting: Family Medicine

## 2018-06-29 DIAGNOSIS — Z794 Long term (current) use of insulin: Principal | ICD-10-CM

## 2018-06-29 DIAGNOSIS — E1165 Type 2 diabetes mellitus with hyperglycemia: Principal | ICD-10-CM

## 2018-06-29 DIAGNOSIS — IMO0001 Reserved for inherently not codable concepts without codable children: Secondary | ICD-10-CM

## 2018-06-29 MED ORDER — FREESTYLE LIBRE READER DEVI: 1.0000 | 0 refills | Status: DC

## 2018-06-29 MED ORDER — LISINOPRIL 2.5 MG PO TABS: 2.5000 mg | ORAL_TABLET | Freq: Every day | ORAL | 1 refills | Status: DC

## 2018-06-29 NOTE — Telephone Encounter (Signed)
Copied from [place] [id number]. Topic: Quick Communication - Rx Refill/Question >> Jun 29, 2018 10:26 AM Blase Mess A wrote: Medication: Continuous Blood Gluc Receiver (FREESTYLE LIBRE READER) DEVI [[id number]] , lisinopril (PRINIVIL,ZESTRIL) 2.5 MG tablet [[id number]]  Has the patient contacted their pharmacy? Yes  (Agent: If no, request that the patient contact the pharmacy for the refill.) (Agent: If yes, when and what did the pharmacy advise?)  Preferred Pharmacy (with phone number or street name): [facility], [place] [phone] (Phone) [phone] (Fax)    Agent: Please be advised that RX refills may take up to 3 business days. We ask that you follow-up with your pharmacy.

## 2018-07-03 ENCOUNTER — Other Ambulatory Visit: Payer: Self-pay | Admitting: Family Medicine

## 2018-07-03 ENCOUNTER — Other Ambulatory Visit: Payer: Self-pay | Admitting: Physician Assistant

## 2018-07-03 ENCOUNTER — Telehealth: Payer: Self-pay

## 2018-07-03 DIAGNOSIS — Z794 Long term (current) use of insulin: Principal | ICD-10-CM

## 2018-07-03 DIAGNOSIS — IMO0001 Reserved for inherently not codable concepts without codable children: Secondary | ICD-10-CM

## 2018-07-03 DIAGNOSIS — E1165 Type 2 diabetes mellitus with hyperglycemia: Principal | ICD-10-CM

## 2018-07-03 NOTE — Telephone Encounter (Signed)
-----   Message from Nunzio Cobbs, MD sent at 07/03/2018 11:43 AM EST ----- Please contact patient with results of endometrial biopsy showing a benign endometrial polyp.  We need to discuss further options for her care for the polyp and for her heavy bleeding.  I recommend an office visit to discuss this further.  She will need an interpreter present.

## 2018-07-03 NOTE — Telephone Encounter (Signed)
Spoke with patient's son [name], okay per ROI. Advised of results as seen below from [place]. [name] is agreeable. Will speak with the patient and return call to schedule an appointment tomorrow.

## 2018-07-03 NOTE — Telephone Encounter (Signed)
Requested Prescriptions  Pending Prescriptions Disp Refills  . TRULICITY 3.97 QB/3.4LP SOPN [Pharmacy Med Name: TRULICITY 0.75MG /0.5ML SDP 4X0.5ML] 2 mL 3    Sig: INJECT 0.75 MG INTO THE SKIN ONCE A WEEK     Endocrinology:  Diabetes - GLP-1 Receptor Agonists Passed - 07/03/2018 12:12 PM      Passed - HBA1C is between 0 and 7.9 and within 180 days    Hemoglobin A1C  Date Value Ref Range Status  04/19/2018 7.1 (A) 4.0 - 5.6 % Final   Hgb A1c MFr Bld  Date Value Ref Range Status  09/19/2017 8.1 (H) 4.8 - 5.6 % Final    Comment:             Prediabetes: 5.7 - 6.4          Diabetes: >6.4          Glycemic control for adults with diabetes: <7.0          Passed - Valid encounter within last 6 months    Recent Outpatient Visits          2 months ago Uncontrolled type 2 diabetes mellitus without complication, with long-term current use of insulin ([place])   [facility] at [place], [place] D, PA-C   6 months ago Uncontrolled type 2 diabetes mellitus without complication, with long-term current use of insulin ([place])   [facility] at [place], [place] D, PA-C   6 months ago Diabetes mellitus type 2, uncontrolled, without complications [facility])   [facility] at [place], [place] D, PA-C   9 months ago Diabetes mellitus type 2, uncontrolled, without complications ([place])   Primary Care at [facility], [place], [place]   10 months ago Flatulence, eructation and gas pain   [facility], Reather Laurence, PA-C      Future Appointments            In 3 months Pamella Pert, Lilia Argue, MD Primary Care at [place], [facility]

## 2018-07-04 ENCOUNTER — Other Ambulatory Visit: Payer: Self-pay | Admitting: Physician Assistant

## 2018-07-04 DIAGNOSIS — E1165 Type 2 diabetes mellitus with hyperglycemia: Principal | ICD-10-CM

## 2018-07-04 DIAGNOSIS — Z794 Long term (current) use of insulin: Principal | ICD-10-CM

## 2018-07-04 DIAGNOSIS — IMO0001 Reserved for inherently not codable concepts without codable children: Secondary | ICD-10-CM

## 2018-07-04 NOTE — Telephone Encounter (Signed)
Requested medication (s) are due for refill today -yes  Requested medication (s) are on the active medication list -yes  Future visit scheduled -yes  Last refill: 01/02/18  Notes to clinic: Patient is requesting medication filled by provider no longer within practice- meets protocol for refill- please have new PCP review for refill.  Requested Prescriptions  Pending Prescriptions Disp Refills   Continuous Blood Gluc Sensor (FREESTYLE LIBRE 14 DAY SENSOR) MISC [Pharmacy Med Name: FREESTYLE LIBRE 14DAY SEN/FLSH KIT] 6 each 1    Sig: APPLY AS DIRECTED EVERY 14 DAYS     Endocrinology: Diabetes - Testing Supplies Passed - 07/04/2018  9:53 AM      Passed - Valid encounter within last 12 months    Recent Outpatient Visits          2 months ago Uncontrolled type 2 diabetes mellitus without complication, with long-term current use of insulin ([place])   [facility] [place], [place] D, PA-C   6 months ago Uncontrolled type 2 diabetes mellitus without complication, with long-term current use of insulin ([place])   Primary Care at [place], [place] D, PA-C   6 months ago Diabetes mellitus type 2, uncontrolled, without complications ([place])   Primary Care at [place], [place] D, PA-C   9 months ago Diabetes mellitus type 2, uncontrolled, without complications ([place])   Primary Care at [facility], [place], [place]   10 months ago Flatulence, eructation and gas pain   [facility], Reather Laurence, PA-C      Future Appointments            In 3 months Pamella Pert, Lilia Argue, MD Primary Care at [place], [facility]            Requested Prescriptions  Pending Prescriptions Disp Refills   Continuous Blood Gluc Sensor (FREESTYLE LIBRE 14 DAY SENSOR) [place] [Pharmacy Med Name: FREESTYLE LIBRE 14DAY SEN/FLSH KIT] 6 each 1    Sig: APPLY AS DIRECTED EVERY 79 DAYS     Endocrinology: Diabetes - Testing Supplies Passed - 07/04/2018  9:53 AM      Passed - Valid encounter within last 12  months    Recent Outpatient Visits          2 months ago Uncontrolled type 2 diabetes mellitus without complication, with long-term current use of insulin ([place])   Primary Care at [place], [place] D, PA-C   6 months ago Uncontrolled type 2 diabetes mellitus without complication, with long-term current use of insulin ([place])   Primary Care at [place], [place] D, PA-C   6 months ago Diabetes mellitus type 2, uncontrolled, without complications [facility])   Primary Care at [place], [place] D, PA-C   9 months ago Diabetes mellitus type 2, uncontrolled, without complications ([place])   Primary Care at [facility], [place], [place]   10 months ago Flatulence, eructation and gas pain   [facility], Reather Laurence, PA-C      Future Appointments            In 3 months Pamella Pert, Lilia Argue, MD Primary Care at [place], [facility]

## 2018-07-06 NOTE — Telephone Encounter (Signed)
Please contact patient in follow up for her endometrial polyp and heavy bleeding.  She needs a follow up visit with me.

## 2018-07-09 NOTE — Telephone Encounter (Signed)
Left message to call Kaitlyn at [phone]. 

## 2018-07-13 ENCOUNTER — Telehealth: Payer: Self-pay | Admitting: Family Medicine

## 2018-07-13 ENCOUNTER — Encounter: Payer: Self-pay | Admitting: Emergency Medicine

## 2018-07-13 ENCOUNTER — Ambulatory Visit
Admission: EM | Admit: 2018-07-13 | Discharge: 2018-07-13 | Disposition: A | Discharge status: Home / Self Care | Payer: BLUE CROSS/BLUE SHIELD

## 2018-07-13 ENCOUNTER — Ambulatory Visit: Payer: Self-pay

## 2018-07-13 DIAGNOSIS — E119 Type 2 diabetes mellitus without complications: Secondary | ICD-10-CM | POA: Diagnosis not present

## 2018-07-13 DIAGNOSIS — R079 Chest pain, unspecified: Secondary | ICD-10-CM | POA: Diagnosis not present

## 2018-07-13 NOTE — ED Notes (Signed)
Patient able to ambulate independently  

## 2018-07-13 NOTE — ED Provider Notes (Signed)
[facility]    CSN: [id number] Arrival date & time: 07/13/18  1345     History   Chief Complaint Chief Complaint  Patient presents with  . Chest Pain    HPI Shelley Escobar is a 48 y.o. female.   Subjective:  Patient is [place].  Translation provided by her son at bedside.  Shelley Escobar Shelley Escobar is a 48 y.o. female who presents for evaluation of chest pain. Onset was around 9:30 am this morning (4.5 hours ago). Symptoms started about 10 minutes after eating breakfast. Symptoms have improved some since that time. The patient describes the pain as a constant ache with radiation through the back. Patient rates pain as a severe in intensity at onset but currently it's mild. She denies any associating symptoms such as dyspnea, fatigue, irregular heart beat, lower extremity edema, orthopnea, palpitations and tachypnea. She denies any aggravating factors such as cold air, coughing, deep inspiration, palpation of chest and walking. Alleviating factors are: none. She took an ibuprofen about an hour prior to arrival without much change in current symptoms. Patient's cardiac risk factors are: diabetes mellitus. Patient's risk factors for DVT/PE: none. Previous cardiac testing: none.  Notably, patient had a similar episode of pain about a couple of months ago. Pain started one morning while she was brushing her teeth. The pain lasted about 10 minutes and spontaneously resolved without any specific intervention. Son reports that she was evaluated once before but "nothing was found" other than her known anemia.   The following portions of the patient's history were reviewed and updated as appropriate: allergies, current medications, past family history, past medical history, past social history, past surgical history and problem list.          Past Medical History:  Diagnosis Date  . Acute appendicitis 01/13/2016  . Anemia    hx  . DM (diabetes mellitus) ([place])   . Hyperlipidemia   .  SVD (spontaneous vaginal delivery)    x 3  . Vitamin D deficiency     Patient Active Problem List   Diagnosis Date Noted  . Iron deficiency anemia secondary to inadequate dietary iron intake 05/23/2018  . IDA (iron deficiency anemia) 05/23/2018  . Uncontrolled type 2 diabetes mellitus without complication, with long-term current use of insulin ([place]) 01/03/2018  . Fatty infiltration of liver 03/10/2016  . Anemia 03/10/2016  . Obesity (BMI 30.0-34.9) 01/27/2012  . Dyslipidemia 07/02/2011    Past Surgical History:  Procedure Laterality Date  . HYSTEROSCOPY W/D&C N/A 08/07/2013   Procedure: DILATATION AND CURETTAGE /HYSTEROSCOPY;  Surgeon: Azalia Bilis, MD;  Location: [facility];  Service: Gynecology;  Laterality: N/A;  . INTRAUTERINE DEVICE (IUD) INSERTION  09/13/2013  . LAPAROSCOPIC APPENDECTOMY N/A 01/13/2016   Procedure: APPENDECTOMY LAPAROSCOPIC;  Surgeon: Armandina Gemma, MD;  Location: [facility];  Service: General;  Laterality: N/A;  . TUBAL LIGATION     Age 68    OB History    Gravida  3   Para  3   Term  3   Preterm      AB      Living  3     SAB      TAB      Ectopic      Multiple      Live Births  3            Home Medications    Prior to Admission medications   Medication Sig Start Date End Date Taking? Authorizing Provider  Continuous Blood Gluc Receiver (FREESTYLE LIBRE READER) DEVI Inject 1 Device into the skin See admin instructions. 06/29/18   Rutherford Guys, MD  Continuous Blood Gluc Sensor (FREESTYLE LIBRE 14 DAY SENSOR) MISC APPLY AS DIRECTED EVERY 14 DAYS 07/05/18   Rutherford Guys, MD  folic acid (FOLVITE) 1 MG tablet Take 2 tablets (2 mg total) by mouth daily. 06/05/18   [place], Holli Humbles, NP  gemfibrozil (LOPID) 600 MG tablet TAKE 1 TABLET(600 MG) BY MOUTH TWICE DAILY BEFORE A MEAL 09/19/17   [place], [place], PA  Insulin Glargine (LANTUS SOLOSTAR) 100 UNIT/ML Solostar Pen ADMINISTER 50 UNITS UNDER THE SKIN DAILY 07/03/18   Rutherford Guys, MD   Insulin Pen Needle (B-D UF III MINI PEN NEEDLES) 31G X 5 MM MISC USE DAILY AS DIRECTED WITH INSULIN 09/19/17   Harrison Mons, PA  Iron-FA-B Cmp-C-Biot-Probiotic (FUSION PLUS) CAPS Take 1 tablet by mouth daily. 05/23/18   [place], Holli Humbles, NP  lisinopril (PRINIVIL,ZESTRIL) 2.5 MG tablet Take 1 tablet (2.5 mg total) by mouth daily. 06/29/18   Rutherford Guys, MD  metFORMIN (GLUCOPHAGE) 1000 MG tablet TAKE 1 TABLET(1000 MG) BY MOUTH TWICE DAILY WITH A MEAL 09/19/17   Jacqulynn Cadet, [place], PA  TRULICITY 1.61 WR/6.0AV SOPN INJECT 0.75 MG INTO THE SKIN ONCE A WEEK 07/03/18   Rutherford Guys, MD    Family History Family History  Adopted: Yes  Family history unknown: Yes    Social History Social History   Tobacco Use  . Smoking status: Never Smoker  . Smokeless tobacco: Never Used  Substance Use Topics  . Alcohol use: No    Alcohol/week: 0.0 standard drinks  . Drug use: No     Allergies   Patient has no known allergies.   Review of Systems Review of Systems  Constitutional: Negative.   Respiratory: Negative for cough, chest tightness and shortness of breath.   Cardiovascular: Positive for chest pain. Negative for palpitations and leg swelling.  Gastrointestinal: Negative.   Neurological: Negative.   All other systems reviewed and are negative.    Physical Exam Triage Vital Signs ED Triage Vitals [07/13/18 1358]  Enc Vitals Group     BP      Pulse      Resp      Temp      Temp src      SpO2      Weight      Height      Head Circumference      Peak Flow      Pain Score 9     Pain Loc      Pain Edu?      Excl. in [place]?    No data found.  Updated Vital Signs BP 121/77 (BP Location: Left Arm)   Pulse 92   Temp 98.4 F (36.9 C) (Oral)   Resp 20   SpO2 98%   Visual Acuity Right Eye Distance:   Left Eye Distance:   Bilateral Distance:    Right Eye Near:   Left Eye Near:    Bilateral Near:     Physical Exam Vitals signs reviewed.  Constitutional:       Appearance: She is well-developed.  HENT:     Head: Normocephalic.  Neck:     Musculoskeletal: Normal range of motion.  Cardiovascular:     Rate and Rhythm: Normal rate and regular rhythm.     Heart sounds: Normal heart sounds.  Pulmonary:     Effort: Pulmonary  effort is normal.     Breath sounds: Normal breath sounds.  Chest:     Chest wall: No tenderness.  Abdominal:     Palpations: Abdomen is soft.     Tenderness: There is no abdominal tenderness.  Musculoskeletal: Normal range of motion.  Skin:    General: Skin is warm and dry.  Neurological:     General: No focal deficit present.     Mental Status: She is alert.  Psychiatric:        Mood and Affect: Mood normal.        Behavior: Behavior normal.      UC Treatments / Results  Labs (all labs ordered are listed, but only abnormal results are displayed) Labs Reviewed - No data to display  EKG None  Radiology No results found.  Procedures Procedures (including critical care time)  Medications Ordered in UC Medications - No data to display  Initial Impression / Assessment and Plan / UC Course  I have reviewed the triage vital signs and the nursing notes.  Pertinent labs & imaging results that were available during my care of the patient were reviewed by me and considered in my medical decision making (see chart for details).    48 yo [name] with a history of insulin dependent type II DM and anemia presenting with chest pain radiating through to her back for the past 4.5 hours.  She denies any associating symptoms.  Nothing makes the pain better.  Nothing makes the pain worse.  The pain was severe at onset but currently mild but still present.  She tried ibuprofen with no relief in her symptoms.  She has had previous episodes in the past but they have never lasted this long. No documented prior cardiac work-up noted.  History slightly suspicious.  EKG shows normal sinus rhythm, no blocks or conduction  defects and no ischemic changes. Heart score 2 = low risk for ACS.  However, this score does not factor in troponins which we are unable to obtain at this facility.  Based on this assessment, patient is advised to go to the ER for complete cardiac work-up.  If troponins are negative, I suspect that she would be able to be discharged from the ED and to follow-up with her primary care provider.  Plan: To emergency department via private vehicle for serial cardiac markers and ECG.  Discussed diagnosis with patient and son. Patient and son understands discharge instructions. Patient and son comfortable with plan and disposition. Patient and son has a clear mental status at this time, good insight into illness (after discussion and teaching) and has clear judgment to make decisions regarding their care.  Documentation was completed with the aid of voice recognition software. Transcription may contain typographical errors. Final Clinical Impressions(s) / UC Diagnoses   Final diagnoses:  Chest pain, unspecified type     Discharge Instructions     GO TO ED FOR FURTHER EVALUATION OF YOUR CHEST PAIN     ED Prescriptions    None     Controlled Substance Prescriptions [place] Controlled Substance Registry consulted? Not Applicable   [place], [place], [place] 07/13/18 1427

## 2018-07-13 NOTE — Telephone Encounter (Signed)
Son, pt.'s interpreter, reports after pt. Had lunch, she started having midline chest pain. Hurts into her back. No other symptoms. Rates pain a "7". States "it is getting better now." Son reports "her cancer doctor said if her iron gets low it could cause chest pain." Instructed to go to ED - son reports they will go to UC.  Reason for Disposition . Patient sounds very sick or weak to the triager  Answer Assessment - Initial Assessment Questions 1. LOCATION: "Where does it hurt?"       Middle 2. RADIATION: "Does the pain go anywhere else?" (e.g., into neck, jaw, arms, back)     Into her back 3. ONSET: "When did the chest pain begin?" (Minutes, hours or days)      After eating lunch 4. PATTERN "Does the pain come and go, or has it been constant since it started?"  "Does it get worse with exertion?"      Constant 5. DURATION: "How long does it last" (e.g., seconds, minutes, hours)     Constant 6. SEVERITY: "How bad is the pain?"  (e.g., Scale 1-10; mild, moderate, or severe)    - MILD (1-3): doesn't interfere with normal activities     - MODERATE (4-7): interferes with normal activities or awakens from sleep    - SEVERE (8-10): excruciating pain, unable to do any normal activities       7 7. CARDIAC RISK FACTORS: "Do you have any history of heart problems or risk factors for heart disease?" (e.g., prior heart attack, angina; high blood pressure, diabetes, being overweight, high cholesterol, smoking, or strong family history of heart disease)     No 8. PULMONARY RISK FACTORS: "Do you have any history of lung disease?"  (e.g., blood clots in lung, asthma, emphysema, birth control pills)     No 9. CAUSE: "What do you think is causing the chest pain?"     Low Iron 10. OTHER SYMPTOMS: "Do you have any other symptoms?" (e.g., dizziness, nausea, vomiting, sweating, fever, difficulty breathing, cough)       No 11. PREGNANCY: "Is there any chance you are pregnant?" "When was your last menstrual  period?"       No  Protocols used: CHEST PAIN-A-AH

## 2018-07-13 NOTE — Telephone Encounter (Signed)
Copied from [place] [id number]. Topic: Quick Communication - See Telephone Encounter >> Jul 13, 2018  2:37 PM Vernona Rieger wrote: CRM for notification. See Telephone encounter for: 07/13/18.  Patient's Son called and said that she went to urgent care today because she was having chest pains and headaches in the morning. They advised him to call her PCP to see if her Insulin Glargine (LANTUS SOLOSTAR) 100 UNIT/ML Solostar Pen could be decreased because her sugar is running to low. It is usually in the 40's in the morning. Please contact her [name] @ [phone]

## 2018-07-13 NOTE — Discharge Instructions (Signed)
GO TO ED FOR FURTHER EVALUATION OF YOUR CHEST PAIN

## 2018-07-13 NOTE — ED Triage Notes (Signed)
Pt presents to [facility] for assessment of central chest pain with no radiation starting today.  Patient c/o nausea with it.

## 2018-07-13 NOTE — Telephone Encounter (Signed)
Son reports pt. Is taking Lantus insulin 50 units at bedtime. UC recommended her PCP decrease this dosage. Please advise son.

## 2018-07-13 NOTE — Telephone Encounter (Signed)
Spoke with patient's son, he stated that the Urgent Care "said everything looks fine." There was another test that they wanted to do, but they didn't have it there. He was told that the ED is the only place that does it. "The doctor at the Urgent Care told him to call here to see if her insulin dosage can be dropped. Pt has blood sugars in the am ranging 40-65. Pt is taking 50 units at night. He stated "they were thinking maybe backing down on insulin." She is taking Metformin and Trulicity. I advised patient's son if symptoms return or worsen return to ED.

## 2018-07-16 ENCOUNTER — Telehealth: Payer: Self-pay

## 2018-07-16 NOTE — Telephone Encounter (Signed)
Son contacted today. See note

## 2018-07-16 NOTE — Telephone Encounter (Signed)
Spoke with pt son this morning  about medication change after being seen in the ER on 07/13/2018. When she was seen in the ER they decreased her Insulin( LANTUS) to 40 units because her sugar was running low around 40 in the mornings, after speaking to son this morning he states since the change her sugars has come up to 60-80 in the morning. Son was advise after PCP recommendations to decrease her insulin( LANTUS) to 30 units and to continue to check for the next three days to see if this help her sugar come up to between 90-130. He was also advised after the third day if her sugars are still low too decreased 5 more units. He verbalized understanding and was informed someone will call to follow-up.

## 2018-07-26 ENCOUNTER — Telehealth: Payer: Self-pay | Admitting: Family Medicine

## 2018-07-26 NOTE — Telephone Encounter (Signed)
Left VM w/ interpreter services in regards to patients appt with Dr. Pamella Pert on 10/04/2018. The provider will be out of the office and would need to be rescheduled.

## 2018-07-30 ENCOUNTER — Other Ambulatory Visit: Payer: Self-pay | Admitting: Family Medicine

## 2018-07-30 DIAGNOSIS — E1165 Type 2 diabetes mellitus with hyperglycemia: Principal | ICD-10-CM

## 2018-07-30 DIAGNOSIS — Z794 Long term (current) use of insulin: Principal | ICD-10-CM

## 2018-07-30 DIAGNOSIS — IMO0001 Reserved for inherently not codable concepts without codable children: Secondary | ICD-10-CM

## 2018-08-06 ENCOUNTER — Telehealth: Payer: Self-pay | Admitting: Family Medicine

## 2018-08-06 NOTE — Telephone Encounter (Signed)
Called and spoke with pt's son (PER DPR)through the interpreter services, regarding their appt scheduled for 10/04/18 with Dr. Pamella Pert. Due to Dr. Pamella Pert being out of the office, pt needed to be rescheduled. I was able to reschedule for 09/26/18 with Dr. Pamella Pert. I advised of time and late policy. Pt's son acknowledged.

## 2018-08-08 NOTE — Telephone Encounter (Signed)
Spoke with patients son, [name], ok per dpr. OV scheduled for 3/4 at 9am with Dr. Quincy Simmonds. Son declined earlier appointments stating patients husband is out of town, waiting for him to return next week.   Routing to provider for final review. Patient is agreeable to disposition. Will close encounter.

## 2018-08-20 NOTE — Progress Notes (Deleted)
GYNECOLOGY  VISIT   HPI: 48 y.o.   Married  [place]  female   [phone] with No LMP recorded. Patient is premenopausal.   here to review results of endometrial biopsy and further options for her heavy bleeding.  GYNECOLOGIC HISTORY: No LMP recorded. Patient is premenopausal. Contraception: Tubal Menopausal hormone therapy:  none Last mammogram: 09-18-17 3D Neg/density B/BiRads1 Last pap smear: 06-14-18 Neg:Neg HR HPV                              06-07-16 Neg        OB History    Gravida  3   Para  3   Term  3   Preterm      AB      Living  3     SAB      TAB      Ectopic      Multiple      Live Births  3              Patient Active Problem List   Diagnosis Date Noted  . Iron deficiency anemia secondary to inadequate dietary iron intake 05/23/2018  . IDA (iron deficiency anemia) 05/23/2018  . Uncontrolled type 2 diabetes mellitus without complication, with long-term current use of insulin ([place]) 01/03/2018  . Fatty infiltration of liver 03/10/2016  . Anemia 03/10/2016  . Obesity (BMI 30.0-34.9) 01/27/2012  . Dyslipidemia 07/02/2011    Past Medical History:  Diagnosis Date  . Acute appendicitis 01/13/2016  . Anemia    hx  . DM (diabetes mellitus) ([place])   . Hyperlipidemia   . SVD (spontaneous vaginal delivery)    x 3  . Vitamin D deficiency     Past Surgical History:  Procedure Laterality Date  . HYSTEROSCOPY W/D&C N/A 08/07/2013   Procedure: DILATATION AND CURETTAGE /HYSTEROSCOPY;  Surgeon: Azalia Bilis, MD;  Location: [facility];  Service: Gynecology;  Laterality: N/A;  . INTRAUTERINE DEVICE (IUD) INSERTION  09/13/2013  . LAPAROSCOPIC APPENDECTOMY N/A 01/13/2016   Procedure: APPENDECTOMY LAPAROSCOPIC;  Surgeon: Armandina Gemma, MD;  Location: [facility];  Service: General;  Laterality: N/A;  . TUBAL LIGATION     Age 37    Current Outpatient Medications  Medication Sig Dispense Refill  . Continuous Blood Gluc Receiver (FREESTYLE LIBRE READER) DEVI  Inject 1 Device into the skin See admin instructions. 1 Device 0  . Continuous Blood Gluc Sensor (FREESTYLE LIBRE 14 DAY SENSOR) MISC APPLY AS DIRECTED EVERY 14 DAYS 6 each 1  . folic acid (FOLVITE) 1 MG tablet Take 2 tablets (2 mg total) by mouth daily. 60 tablet 11  . gemfibrozil (LOPID) 600 MG tablet TAKE 1 TABLET(600 MG) BY MOUTH TWICE DAILY BEFORE A MEAL 180 tablet 3  . Insulin Glargine (LANTUS SOLOSTAR) 100 UNIT/ML Solostar Pen ADMINISTER 50 UNITS UNDER THE SKIN DAILY 15 mL 1  . Insulin Pen Needle (B-D UF III MINI PEN NEEDLES) 31G X 5 MM MISC USE DAILY AS DIRECTED WITH INSULIN 100 each 3  . Iron-FA-B Cmp-C-Biot-Probiotic (FUSION PLUS) CAPS Take 1 tablet by mouth daily. 30 capsule 11  . lisinopril (PRINIVIL,ZESTRIL) 2.5 MG tablet Take 1 tablet (2.5 mg total) by mouth daily. 90 tablet 1  . metFORMIN (GLUCOPHAGE) 1000 MG tablet TAKE 1 TABLET(1000 MG) BY MOUTH TWICE DAILY WITH A MEAL 180 tablet 3  . TRULICITY 8.09 XI/3.3AS SOPN INJECT 0.75 MG INTO THE SKIN ONCE A WEEK 2 mL  3   No current facility-administered medications for this visit.      ALLERGIES: Patient has no known allergies.  Family History  Adopted: Yes  Family history unknown: Yes    Social History   Socioeconomic History  . Marital status: Married    Spouse name: [name]  . Number of children: 3  . Years of education: none  . Highest education level: Not on file  Occupational History  . 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 Financial resource strain: Not on file  . Food insecurity:    Worry: Not on file    Inability: Not on file  . Transportation needs:    Medical: Not on file    Non-medical: Not on file  Tobacco Use  . Smoking status: Never Smoker  . Smokeless tobacco: Never Used  Substance and Sexual Activity  . Alcohol use: No    Alcohol/week: 0.0 standard drinks  . Drug use: No  . Sexual activity: Yes    Partners: Male     Birth control/protection: Surgical    Comment: Tubal  Lifestyle  . Physical activity:    Days per week: Not on file    Minutes per session: Not on file  . Stress: Not on file  Relationships  . Social connections:    Talks on phone: Not on file    Gets together: Not on file    Attends religious service: Not on file    Active member of club or organization: Not on file    Attends meetings of clubs or organizations: Not on file    Relationship status: Not on file  . Intimate partner violence:    Fear of current or ex partner: Not on file    Emotionally abused: Not on file    Physically abused: Not on file    Forced sexual activity: Not on file  Other Topics Concern  . 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      Married in 1983 and has 3 grown children, 2 boys and one girl.       Lives with husband and two boys. Daughter lives locally.   Smoke detectors in home? Yes    Guns in home? No    Consistent seatbelt use? Yes    Consistent dental brushing and flossing? Yes    Semi-Annual dental visits: No    Annual Eye exams: No         Review of Systems  PHYSICAL EXAMINATION:    There were no vitals taken for this visit.    General appearance: alert, cooperative and appears stated age Head: Normocephalic, without obvious abnormality, atraumatic Neck: no adenopathy, supple, symmetrical, trachea midline and thyroid normal to inspection and palpation Lungs: clear to auscultation bilaterally Breasts: normal appearance, no masses or tenderness, No nipple retraction or dimpling, No nipple discharge or bleeding, No axillary or supraclavicular adenopathy Heart: regular rate and rhythm Abdomen: soft, non-tender, no masses,  no organomegaly Extremities: extremities normal, atraumatic, no cyanosis or edema Skin: Skin color, texture, turgor normal. No rashes or lesions Lymph nodes:  Cervical, supraclavicular, and axillary nodes normal. No abnormal inguinal nodes palpated Neurologic: Grossly normal  Pelvic: External genitalia:  no lesions              Urethra:  normal appearing urethra  with no masses, tenderness or lesions              Bartholins and Skenes: normal                 Vagina: normal appearing vagina with normal color and discharge, no lesions              Cervix: no lesions                Bimanual Exam:  Uterus:  normal size, contour, position, consistency, mobility, non-tender              Adnexa: no mass, fullness, tenderness              Rectal exam: {yes no:[id number]}.  Confirms.              Anus:  normal sphincter tone, no lesions  Chaperone was present for exam.  ASSESSMENT     PLAN     An After Visit Summary was printed and given to the patient.  ______ minutes face to face time of which over 50% was spent in counseling.

## 2018-08-22 ENCOUNTER — Ambulatory Visit: Payer: Self-pay | Admitting: Obstetrics and Gynecology

## 2018-09-06 ENCOUNTER — Ambulatory Visit: Payer: BLUE CROSS/BLUE SHIELD | Admitting: Obstetrics and Gynecology

## 2018-09-06 ENCOUNTER — Encounter: Payer: Self-pay | Admitting: Obstetrics and Gynecology

## 2018-09-06 ENCOUNTER — Other Ambulatory Visit: Payer: Self-pay

## 2018-09-06 VITALS — BP 102/60 | HR 88 | Resp 14 | Ht 63.5 in | Wt 166.0 lb

## 2018-09-06 DIAGNOSIS — N920 Excessive and frequent menstruation with regular cycle: Secondary | ICD-10-CM

## 2018-09-06 DIAGNOSIS — R739 Hyperglycemia, unspecified: Secondary | ICD-10-CM | POA: Diagnosis not present

## 2018-09-06 NOTE — Patient Instructions (Signed)
Hysterectomy Information  A hysterectomy is a surgery in which the uterus is removed. The fallopian tubes and ovaries may be removed (bilateral salpingo-oophorectomy) as well. This procedure may be done to treat various medical problems. After the procedure, a woman will no longer have menstrual periods nor will she be able to become pregnant (sterile). What are the reasons for a hysterectomy? There are many reasons why a woman might have this procedure. They include:  Persistent, abnormal vaginal bleeding.  Long-term (chronic) pelvic pain or infection.  Endometriosis. This is when the lining of the uterus (endometrium) starts to grow outside the uterus.  Adenomyosis. This is when the endometrium starts to grow in the muscle of the uterus.  Pelvic organ prolapse. This is a condition in which the uterus falls down into the vagina.  Noncancerous growths in the uterus (uterine fibroids) that cause symptoms.  The presence of precancerous cells.  Cervical or uterine cancer. What are the different types of hysterectomy? There are three different types of hysterectomy:  Supracervical hysterectomy. In this type, the top part of the uterus is removed, but not the cervix.  Total hysterectomy. In this type, the uterus and cervix are removed.  Radical hysterectomy. In this type, the uterus, the cervix, and the tissue that holds the uterus in place (parametrium) are removed. What are the different ways a hysterectomy can be performed? There are many different ways a hysterectomy can be performed, including:  Abdominal hysterectomy. In this type, an incision is made in the abdomen. The uterus is removed through this incision.  Vaginal hysterectomy. In this type, an incision is made in the vagina. The uterus is removed through this incision. There are no abdominal incisions.  Conventional laparoscopic hysterectomy. In this type, three or four small incisions are made in the abdomen. A thin,  lighted tube with a camera (laparoscope) is inserted into one of the incisions. Other tools are put through the other incisions. The uterus is cut into small pieces. The small pieces are removed through the incisions or through the vagina.  Laparoscopically assisted vaginal hysterectomy (LAVH). In this type, three or four small incisions are made in the abdomen. Part of the surgery is performed laparoscopically and the other part is done vaginally. The uterus is removed through the vagina.  Robot-assisted laparoscopic hysterectomy. In this type, a laparoscope and other tools are inserted into three or four small incisions in the abdomen. A computer-controlled device is used to give the surgeon a 3D image and to help control the surgical instruments. This allows for more precise movements of surgical instruments. The uterus is cut into small pieces and removed through the incisions or removed through the vagina. Discuss the options with your health care provider to determine which type is the right one for you. What are the risks? Generally, this is a safe procedure. However, problems may occur, including:  Bleeding and risk of blood transfusion. Tell your health care provider if you do not want to receive any blood products.  Blood clots in the legs or lung.  Infection.  Damage to other structures or organs.  Allergic reactions to medicines.  Changing to an abdominal hysterectomy from one of the other techniques. What to expect after a hysterectomy  You will be given pain medicine.  You may need to stay in the hospital for 1- 2 days to recover, depending on the type of hysterectomy you had.  Follow your health care provider's instructions about exercise, driving, and general activities. Ask your  health care provider what activities are safe for you.  You will need to have someone with you for the first 3-5 days after you go home.  You will need to follow up with your surgeon in 2-4  weeks after surgery to evaluate your progress.  If the ovaries are removed, you will have early menopause symptoms such as hot flashes, night sweats, and insomnia.  If you had a hysterectomy for a problem that was not cancer or not a condition that could lead to cancer, then you no longer need Pap tests. However, even if you no longer need a Pap test, a regular pelvic exam is a good idea to make sure no other problems are developing. Questions to ask your health care provider  Is a hysterectomy medically necessary? Do I have other treatment options for my condition?  What are my options for hysterectomy procedure?  What organs and tissues need to be removed?  What are the risks?  What are the benefits?  How long will I need to stay in the hospital after the procedure?  How long will I need to recover at home?  What symptoms can I expect after the procedure? Summary  A hysterectomy is a surgery in which the uterus is removed. The fallopian tubes and ovaries may be removed (bilateral salpingo-oophorectomy) as well.  This procedure may be done to treat various medical problems. After the procedure, a woman will no longer have menstrual periods nor will she be able to become pregnant.  Discuss the options with your health care provider to determine which type of hysterectomy is the right one for you. This information is not intended to replace advice given to you by your health care provider. Make sure you discuss any questions you have with your health care provider. Document Released: 11/30/2000 Document Revised: 07/13/2016 Document Reviewed: 07/13/2016 Elsevier Interactive Patient Education  2019 [place] tin v? th? thu?t c?t b? t? cung Hysterectomy Information  Th? thu?t c?t b? t? cung l m?t ph?u thu?t ?? lo?i b? t? cung. ?ng d?n tr?ng v bu?ng tr?ng c?ng c th? ???c c?t b? (th? thu?t c?t b? bu?ng tr?ng v vi tr?ng). Th? thu?t ny c?ng c th? ???c th?c hi?n ?? ?i?u  tr? nh?ng v?n ?? b?nh l khc nhau. Sau khi th?c hi?n th? thu?t, n? gi?i s? khng cn cc chu k? kinh nguy?t v c?ng khng th? c thai (tri?t s?n). Nh?ng l do no d?n ??n th? thu?t c?t b? t? cung? C nhi?u l do khi?n ph? n? c th? ph?i th?c hi?n th? thu?t ny. Cc l do ? bao g?m:  Ch?y mu m ??o dai d?ng, b?t th??ng.  ?au ho?c nhi?m trng vng ch?u ko di (m?n tnh).  L?c n?i m?c t? cung. Tnh tr?ng ny x?y ra khi nim m?c t? cung (n?i m?c t? cung) b?t ??u pht tri?n ra ngoi t? cung.  L?c n?i m?c trong c? t? cung. Tnh tr?ng ny x?y ra khi n?i m?c t? cung b?t ??u pht tri?n trong c? t? cung.  Sa c? quan vng ch?u. ?y l tnh tr?ng t? cung sa xu?ng m ??o.  Cc kh?i u khng ph?i ung th? pht tri?n trong t? cung (u x? t? cung) gy ra cc tri?u ch?ng.  Xu?t hi?n cc t? bo ti?n ung th?.  Ung th? c? t? cung ho?c ung th? t? cung. C nh?ng d?ng th? thu?t c?t b? t? cung khc nhau no? C ba d?ng th? thu?t  c?t b? t? cung khc nhau:  Th? thu?t c?t b? t? cung trn c? t? cung. ? d?ng ny, ph?n trn c?a t? cung b? c?t b?, nh?ng khng c?t b? c? t? cung.  Th? thu?t c?t b? t? cung ton ph?n. ? d?ng ny, t? cung v c? t? cung ??u b? c?t b?.  Th? thu?t c?t b? t? cung tri?t c?n. ? d?ng ny, t? cung, c? t? cung v m gi? t? cung t?i ch? (m c?n t? cung) ??u b? c?t b?. C th? th?c hi?n nh?ng cch c?t b? t? cung no khc? C nhi?u cch c?t b? t? cung khc nhau, bao g?m:  Th? thu?t c?t b? t? cung theo ???ng b?ng. ? d?ng ny, m?t v?t m? s? ???c t?o ra ? b?ng. T? cung ???c c?t b? qua v?t m? ny.  Th? thu?t c?t b? t? cung theo ???ng m ??o. ? d?ng ny, m?t v?t m? s? ???c t?o ra trong m ??o. T? cung ???c c?t b? qua v?t m? ny. Khng c v?t m? ? b?ng.  Th? thu?t c?t b? t? cung b?ng ph??ng php n?i soi ? b?ng thng th??ng. ? d?ng ny, ba ho?c b?n v?t m? nh? t?o ra ? b?ng. M?t ?ng m?ng, c ?n, km theo camera (?ng soi ? b?ng) ???c ??a vo m?t trong nh?ng v?t m?. Nh?ng d?ng c? khc ???c ??a vo qua cc  v?t m? khc. T? cung ???c c?t thnh t?ng m?u nh?. Cc m?u nh? ny ???c l?y ra qua cc v?t m? ho?c qua m ??o.  C?t b? t? cung qua m ??o ???c h? tr? b?ng n?i soi ? b?ng (LAVH). ? d?ng ny, ba ho?c b?n v?t m? nh? t?o ra ? b?ng. M?t ph?n ph?u thu?t ???c th?c hi?n b?ng ph??ng php n?i soi ? b?ng v m?t ph?n qua m ??o. T? cung ???c lo?i b? qua m ??o.  Th? thu?t c?t b? t? cung b?ng ph??ng php n?i soi ? b?ng c r b?t h? tr?. ? d?ng ny, m?t ?ng soi ? b?ng v cc d?ng c? khc ???c ??a vo ba ho?c b?n v?t m? nh? ? b?ng. M?t thi?t b? ?i?u khi?n b?ng my vi tnh ???c s? d?ng ?? cung c?p cho bc s? ph?u thu?t hnh ?nh 3D v gip ki?m sot cc d?ng c? ph?u thu?t. ?i?u ny cho php d?ch chuy?n cc d?ng c? ph?u thu?t chnh xc h?n. T? cung ???c c?t thnh t?ng m?u nh? v l?y ra qua cc v?t m? ho?c qua m ??o. Trao ??i v?i chuyn gia ch?m [place] s?c kh?e v? cc ty ch?n ?? xc ??nh d?ng no ph h?p v?i qu v?. C nh?ng nguy c? no? Ni chung, ?y l m?t th? thu?t an ton. Tuy nhin, cc v?n ?? c th? x?y ra, bao g?m:  Ch?y mu v nguy c? ph?i truy?n mu. Hy cho chuyn gia ch?m [place] s?c kh?e c?a qu v? bi?t n?u qu v? khng mu?n nh?n b?t k? s?n ph?m mu no.  C?c mu ?ng ? chn ho?c ? ph?i.  Nhi?m trng.  Gy th??ng t?n ca?c c?u tru?c ho??c c? quan kha?c.  Ph?n ?ng di? ??ng v?i thu?c.  Chuy?n t? m?t trong nh?ng k? thu?t khc sang th? thu?t c?t b? t? cung qua ???ng b?ng. Nh?ng d? ki?n sau khi th?c hi?n th? thu?t c?t b? t? cung  Qu v? s? ???c cho s? d?ng thu?c gi?m ?au.  Qu v? c th? c?n n?m vi?n t? 1- 2 ngy ??  h?i ph?c, ty thu?c vo d?ng th? thu?t c?t b? t? cung c?a qu v?.  Tun th? ch? d?n c?a chuyn gia ch?m [place] s?c kh?e v? luy?n t?p, li xe v cc ho?t ??ng chung. H?i chuyn gia ch?m [place] s?c kh?e v? cc ho?t ??ng no an ton cho qu v?.  Qu v? s? c?n ph?i c ai ? ? cng qu v? trong 3-5 ngy ??u tin sau khi v? nh.  Qu v? s? c?n ph?i g?p bc s? ph?u thu?t ?? khm theo di sau khi ph?u  thu?t 2-4 tu?n ?? ?nh gi s? ti?n tri?n c?a qu v?.  N?u bu?ng tr?ng b? c?t b?, qu v? s? c cc tri?u ch?ng mn kinh s?m ch?ng h?n nh? b?c h?a, ?? m? hi ban ?m v m?t ng?.  N?u qu v? b? c?t b? t? cung do v?n ?? khng ph?i ung th? ho?c khng ph?i l m?t tnh tr?ng c th? d?n ??n ung th?, th qu v? khng c?n ph?i lm xt nghi?m ph?t t? bo c? t? cung (Pap) n?a. Tuy nhin, ngay c? khi qu v? khng c?n ph?i lm xt nghi?m ph?t t? bo c? t? cung (Pap) n?a, th qu v? nn khm vng ch?u ??nh k? ?? ??m b?o khng c v?n ?? no khc ?ang pht sinh. Ca?c cu h?i ??t ra v?i chuyn gia ch?m [place] s?c kh?e c?a qu v?  C?t b? t? cung c c?n thi?t v? m?t y [employer]? Ti c cc l?a ch?n ?i?u tr? no khc cho tnh tr?ng c?a mnh khng?  C nh?ng ty ch?n no cho th? thu?t c?t b? t? cung c?a ti?  Nh?ng c? quan v m no c?n ph?i ???c c?t b??  C nh?ng nguy c? no?  C nh?ng l?i ch no?  Ti c?n ?? la?i b?nh vi?n bao lu sau khi th?c hi?n thu? thu?t?  Ti c?n bao lu ?? ph?c h?i t?i nh?  C nh?ng tri?u ch?ng no d? ki?n s? x?y ra sau th? thu?t? Tm t?t  Th? thu?t c?t b? t? cung l m?t ph?u thu?t ?? lo?i b? t? cung. ?ng d?n tr?ng v bu?ng tr?ng c?ng c th? ???c c?t b? (th? thu?t c?t b? bu?ng tr?ng v vi tr?ng).  Th? thu?t ny c?ng c th? ???c th?c hi?n ?? ?i?u tr? nh?ng v?n ?? b?nh l khc nhau. Sau khi lm xong th? thu?t ny, n? gi?i s? khng cn cc chu k kinh nguy?t v c?ng s? khng th? c [place].  Trao ??i v?i chuyn gia ch?m [place] s?c kh?e v? cc ty ch?n ?? xc ??nh d?ng th? thu?t c?t b? t? cung no ph h?p v?i qu v?. Thng tin ny khng nh?m m?c ?ch thay th? cho l?i khuyn m chuyn gia ch?m [place] s?c kh?e ni v?i qu v?. Hy b?o ??m qu v? ph?i th?o lu?n b?t k? v?n ?? g m qu v? c v?i chuyn gia ch?m [place] s?c kh?e c?a qu v?. Document Released: 05/26/2011 Document Revised: 01/31/2017 Document Reviewed: 01/31/2017 Elsevier Interactive Patient Education  2019 [employer].

## 2018-09-06 NOTE — Progress Notes (Signed)
GYNECOLOGY  VISIT   HPI: 48 y.o.   Married  [place]  female   [phone] with Patient's last menstrual period was 08/19/2018 (within weeks).   here for discuss tx for endometrial polyp and bleeding.  Interpretor present.   February period was heavy for first few days.  She bled through her clothing first night while sleeping.  US showing possible adenomyosis.  Small fibroid. EMB showing benign endometrial polyp.   Last Hgb 10.5 on 05/23/18.  No fatigue.  No Kaas or SOB.  No palpitations.  Taking iron two pills a day.  Under care of Dr. Marin Olp for her anemia.  Hx prior hysteroscopy with dilation and curettage.  Denies future childbearing.  Wants all bleeding to stop.   Has a Environmental consultant.   GYNECOLOGIC HISTORY: Patient's last menstrual period was 08/19/2018 (within weeks). Contraception:  Tubal Menopausal hormone therapy:  none Last mammogram:  09-18-17 3D Neg/density B/BiRads1 Last pap smear:   06-14-18 Neg:Neg HR HPV                              06-07-16 Neg        OB History    Gravida  3   Para  3   Term  3   Preterm      AB      Living  3     SAB      TAB      Ectopic      Multiple      Live Births  3              Patient Active Problem List   Diagnosis Date Noted  . Iron deficiency anemia secondary to inadequate dietary iron intake 05/23/2018  . IDA (iron deficiency anemia) 05/23/2018  . Uncontrolled type 2 diabetes mellitus without complication, with long-term current use of insulin ([place]) 01/03/2018  . Fatty infiltration of liver 03/10/2016  . Anemia 03/10/2016  . Obesity (BMI 30.0-34.9) 01/27/2012  . Dyslipidemia 07/02/2011    Past Medical History:  Diagnosis Date  . Acute appendicitis 01/13/2016  . Anemia    hx  . DM (diabetes mellitus) ([place])   . Endometrial polyp    Not removed as of 09/07/18  . Hyperlipidemia   . Menorrhagia with regular cycle   . SVD (spontaneous vaginal delivery)    x 3  . Vitamin D deficiency      Past Surgical History:  Procedure Laterality Date  . HYSTEROSCOPY W/D&C N/A 08/07/2013   Procedure: DILATATION AND CURETTAGE /HYSTEROSCOPY;  Surgeon: Azalia Bilis, MD;  Location: [facility];  Service: Gynecology;  Laterality: N/A;  . INTRAUTERINE DEVICE (IUD) INSERTION  09/13/2013  . LAPAROSCOPIC APPENDECTOMY N/A 01/13/2016   Procedure: APPENDECTOMY LAPAROSCOPIC;  Surgeon: Armandina Gemma, MD;  Location: [facility];  Service: General;  Laterality: N/A;  . TUBAL LIGATION     Age 68    Current Outpatient Medications  Medication Sig Dispense Refill  . Continuous Blood Gluc Receiver (FREESTYLE LIBRE READER) DEVI Inject 1 Device into the skin See admin instructions. 1 Device 0  . Continuous Blood Gluc Sensor (FREESTYLE LIBRE 14 DAY SENSOR) MISC APPLY AS DIRECTED EVERY 14 DAYS 6 each 1  . folic acid (FOLVITE) 1 MG tablet Take 2 tablets (2 mg total) by mouth daily. 60 tablet 11  . gemfibrozil (LOPID) 600 MG tablet TAKE 1 TABLET(600 MG) BY MOUTH TWICE DAILY BEFORE A MEAL 180 tablet 3  .  Insulin Glargine (LANTUS SOLOSTAR) 100 UNIT/ML Solostar Pen ADMINISTER 50 UNITS UNDER THE SKIN DAILY 15 mL 1  . Insulin Pen Needle (B-D UF III MINI PEN NEEDLES) 31G X 5 MM MISC USE DAILY AS DIRECTED WITH INSULIN 100 each 3  . Iron-FA-B Cmp-C-Biot-Probiotic (FUSION PLUS) CAPS Take 1 tablet by mouth daily. 30 capsule 11  . lisinopril (PRINIVIL,ZESTRIL) 2.5 MG tablet Take 1 tablet (2.5 mg total) by mouth daily. 90 tablet 1  . metFORMIN (GLUCOPHAGE) 1000 MG tablet TAKE 1 TABLET(1000 MG) BY MOUTH TWICE DAILY WITH A MEAL 180 tablet 3  . TRULICITY 5.40 GQ/6.7YP SOPN INJECT 0.75 MG INTO THE SKIN ONCE A WEEK 2 mL 3   No current facility-administered medications for this visit.      ALLERGIES: Patient has no known allergies.  Family History  Adopted: Yes  Family history unknown: Yes    Social History   Socioeconomic History  . Marital status: Married    Spouse name: [name]  . Number of children: 3  . Years of education:  none  . Highest education level: Not on file  Occupational History  . 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 Financial resource strain: Not on file  . Food insecurity:    Worry: Not on file    Inability: Not on file  . Transportation needs:    Medical: Not on file    Non-medical: Not on file  Tobacco Use  . Smoking status: Never Smoker  . Smokeless tobacco: Never Used  Substance and Sexual Activity  . Alcohol use: No    Alcohol/week: 0.0 standard drinks  . Drug use: No  . Sexual activity: Yes    Partners: Male    Birth control/protection: Surgical    Comment: Tubal  Lifestyle  . Physical activity:    Days per week: Not on file    Minutes per session: Not on file  . Stress: Not on file  Relationships  . Social connections:    Talks on phone: Not on file    Gets together: Not on file    Attends religious service: Not on file    Active member of club or organization: Not on file    Attends meetings of clubs or organizations: Not on file    Relationship status: Not on file  . Intimate partner violence:    Fear of current or ex partner: Not on file    Emotionally abused: Not on file    Physically abused: Not on file    Forced sexual activity: Not on file  Other Topics Concern  . 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      Married in 1983 and has 3 grown children, 2 boys and one girl.       Lives with husband and two boys. Daughter lives locally.   Smoke detectors in home? Yes    Guns in home? No    Consistent seatbelt use? Yes    Consistent dental brushing and flossing? Yes    Semi-Annual dental visits: No    Annual Eye exams: No         Review of Systems  Constitutional: Negative.   HENT: Negative.   Eyes: Negative.   Respiratory: Negative.   Cardiovascular: Negative.    Gastrointestinal: Negative.   Endocrine: Negative.  Genitourinary: Negative.   Musculoskeletal: Negative.   Skin: Negative.   Allergic/Immunologic: Negative.   Neurological: Negative.   Hematological: Negative.   Psychiatric/Behavioral: Negative.     PHYSICAL EXAMINATION:    BP 102/60 (BP Location: Left Arm, Patient Position: Sitting, Cuff Size: Normal)   Pulse 88   Resp 14   Ht 5' 3.5" (1.613 m)   Wt 166 lb (75.3 kg)   LMP 08/19/2018 (Within Weeks)   BMI 28.94 kg/m     General appearance: alert, cooperative and appears stated age  ASSESSMENT  Menorrhagia.  Suspected adenomyosis, a small fibroid, and an endometrial polyp.  DM.  Status post BTL.   PLAN  Will do blood work today to recheck her anemia. Consider hysteroscopy versus hysterectomy.  She prefers hysterectomy.  We discussed laparoscopic hysterectomy with bilateral salpingectomy.  Will work toward this plan as soon as the coronavirus is under control. Declines Micronor in the interim.    An After Visit Summary was printed and given to the patient.  __25____ minutes face to face time of which over 50% was spent in counseling.

## 2018-09-07 ENCOUNTER — Encounter: Payer: Self-pay | Admitting: Obstetrics and Gynecology

## 2018-09-07 LAB — CBC
HEMOGLOBIN: 9.5 g/dL — AB (ref 11.1–15.9)
Hematocrit: 30.4 % — ABNORMAL LOW (ref 34.0–46.6)
MCH: 24.9 pg — ABNORMAL LOW (ref 26.6–33.0)
MCHC: 31.3 g/dL — ABNORMAL LOW (ref 31.5–35.7)
MCV: 80 fL (ref 79–97)
Platelets: 319 10*3/uL (ref 150–450)
RBC: 3.81 x10E6/uL (ref 3.77–5.28)
RDW: 13 % (ref 11.7–15.4)
WBC: 9.8 10*3/uL (ref 3.4–10.8)

## 2018-09-07 LAB — IRON: Iron: 49 ug/dL (ref 27–159)

## 2018-09-07 LAB — HEMOGLOBIN A1C
Est. average glucose Bld gHb Est-mCnc: 140 mg/dL
Hgb A1c MFr Bld: 6.5 % — ABNORMAL HIGH (ref 4.8–5.6)

## 2018-09-07 LAB — FERRITIN: Ferritin: 8 ng/mL — ABNORMAL LOW (ref 15–150)

## 2018-09-10 ENCOUNTER — Telehealth: Payer: Self-pay | Admitting: Emergency Medicine

## 2018-09-10 NOTE — Telephone Encounter (Signed)
-----   Message from Nunzio Cobbs, MD sent at 09/07/2018  8:03 AM EDT ----- Please report results of testing to the patient.   Shelley Escobar has heavy menses and continues to be anemic.  I recommend Shelley Escobar return to Dr. Marin Olp for a potential iron infusion.  Shelley Escobar is already on oral iron.   We are working our way toward a hysterectomy, but will need to put this on hold due to the current coronavirus status.   I do recommend Shelley Escobar start taking Micronor birth control pills, which may lessen the bleeding while we are waiting to do her surgery. (Shelley Escobar declined this yesterday.) Shelley Escobar will need to take this in a timely fashion.  This may reduce the need for future iron transfusions while we plan her surgery.

## 2018-09-10 NOTE — Telephone Encounter (Signed)
If patient returns call, please allow to hold or transfer to any available triage nurse. Thank you.   Message left in vietnamese to return our call.  Translator Claiborne Billings [phone].

## 2018-09-26 ENCOUNTER — Other Ambulatory Visit: Payer: Self-pay

## 2018-09-26 ENCOUNTER — Telehealth: Payer: BLUE CROSS/BLUE SHIELD | Admitting: Family Medicine

## 2018-10-04 ENCOUNTER — Ambulatory Visit: Payer: BLUE CROSS/BLUE SHIELD | Admitting: Family Medicine

## 2018-10-14 ENCOUNTER — Other Ambulatory Visit: Payer: Self-pay | Admitting: Family Medicine

## 2018-10-14 DIAGNOSIS — IMO0001 Reserved for inherently not codable concepts without codable children: Secondary | ICD-10-CM

## 2018-10-14 DIAGNOSIS — E1165 Type 2 diabetes mellitus with hyperglycemia: Principal | ICD-10-CM

## 2018-10-14 DIAGNOSIS — Z794 Long term (current) use of insulin: Principal | ICD-10-CM

## 2018-10-16 ENCOUNTER — Telehealth: Payer: Self-pay | Admitting: Obstetrics and Gynecology

## 2018-10-16 NOTE — Telephone Encounter (Signed)
Left message to call Zalayah Pizzuto at [phone]. 

## 2018-10-16 NOTE — Telephone Encounter (Signed)
Patient's son [name] called and states patient was offered medication to stop her bleeding at her last appointment and she declined. Patient now would like to start medication since her surgery is delayed due to COVID-19. Please advise.

## 2018-10-17 NOTE — Telephone Encounter (Signed)
Patient really needs an office visit to assess her bleeding, check a hemoglobin, and triage her for surgery.  I am happy to see her tomorrow with an interpretor to do this decision making. If she is symptomatic and having uncontrolled bleeding today, she needs to be seen today.   Cc- Dr. Talbert Nan

## 2018-10-17 NOTE — Telephone Encounter (Signed)
Spoke with patient's spouse Shelley Escobar, okay per ROI, who is now with the patient. Patient is changing her pad twice a day and it has very little blood on it. At night she is wearing ultra pads and is changing every hour due to them being full. Has been bleeding for 3 days. Reports feeling fatigued. Denies any SOB, weakness, dizziness, or light headedness. Advised will need to be seen in office for evaluation. Offered appointment today, but this is declines. Appointment scheduled for tomorrow at 11:30 am with Dr.Silva. Shelley Escobar and patient are agreeable to date and time. Advised if bleeding increases to having to change her pad every hour for more than 2 hours due to soaking through, becomes SOB, dizzy, or light headed needs to be seen immediately for evaluation in the office or ER. Shelley Escobar verbalizes understanding.  Routing to provider and will close encounter.

## 2018-10-17 NOTE — Telephone Encounter (Signed)
Spoke with patient's spouse [name], okay per ROI. [name] states that the patient has started her menses again and it has been heavy. Spouse is unable to tell me specifics of bleeding. "It is that same that it normally is when she has her menses." Previously has been bleeding through pads while sleeping and changing pads every 30 minutes-2 hours. Under care with Dr.Ennever for anemia. Reports the patient declined Micronor when offered before, but would like to start until she can have her hysterectomy with Dr.Silva. This was discussed at her last OV on 09/06/2018. Surgery delayed due to COVID 19. Advised will review with Dr.Jertson as Dr.Silva is out of the office and return call.

## 2018-10-18 ENCOUNTER — Other Ambulatory Visit: Payer: Self-pay

## 2018-10-18 ENCOUNTER — Encounter: Payer: Self-pay | Admitting: Obstetrics and Gynecology

## 2018-10-18 ENCOUNTER — Ambulatory Visit: Payer: BLUE CROSS/BLUE SHIELD | Admitting: Obstetrics and Gynecology

## 2018-10-18 VITALS — BP 110/64 | HR 68 | Temp 98.1°F | Resp 16 | Wt 166.0 lb

## 2018-10-18 DIAGNOSIS — N920 Excessive and frequent menstruation with regular cycle: Secondary | ICD-10-CM | POA: Insufficient documentation

## 2018-10-18 DIAGNOSIS — N840 Polyp of corpus uteri: Secondary | ICD-10-CM | POA: Insufficient documentation

## 2018-10-18 MED ORDER — NORETHINDRONE 0.35 MG PO TABS: 1.0000 | ORAL_TABLET | Freq: Every day | ORAL | 0 refills | Status: DC

## 2018-10-18 NOTE — Progress Notes (Signed)
GYNECOLOGY  VISIT   HPI: 48 y.o.   Married  Asian  female   [phone] with Patient's last menstrual period was 10/15/2018 (exact date).   here for abnormal uterine bleeding.   Vietnamese interpretor number - [phone] on phone for the entire consultation.   She wants to get on Micronor, which she declined at the last office visit.   She has heavy menses, an endometrial polyp, and suspected adenomyosis. She is waiting to proceed with hysterectomy.   Last menses and this menses are similar.  With this menses, her flow started last night, she bled all over she sheets.  She used 2 pads for heavy flow.  No pain, but her body feels sore.  Has lightheadedness when she goes from sitting to standing.  Feels her heart is beating quickly, more than 100 bpm, per patient. No [place] or shortness of breath.   She has chronic anemia managed by oncology.  Hgb 9.5 on 09/06/18.  She usually has Hgb in the 8 - 10 range.  Taking iron twice daily, Fusion plus.    GYNECOLOGIC HISTORY: Patient's last menstrual period was 10/15/2018 (exact date). Contraception:  Tubal  Menopausal hormone therapy:  none Last mammogram:  09-18-17 3D Neg/density B/BiRads1 Last pap smear:   06-14-18 Neg:Neg HR HPV 06-07-16 Neg        OB History    Gravida  3   Para  3   Term  3   Preterm      AB      Living  3     SAB      TAB      Ectopic      Multiple      Live Births  3              Patient Active Problem List   Diagnosis Date Noted  . Iron deficiency anemia secondary to inadequate dietary iron intake 05/23/2018  . IDA (iron deficiency anemia) 05/23/2018  . Uncontrolled type 2 diabetes mellitus without complication, with long-term current use of insulin ([place]) 01/03/2018  . Fatty infiltration of liver 03/10/2016  . Anemia 03/10/2016  . Obesity (BMI 30.0-34.9) 01/27/2012  . Dyslipidemia 07/02/2011    Past Medical History:  Diagnosis Date  . Acute appendicitis  01/13/2016  . Anemia    hx  . DM (diabetes mellitus) ([place])   . Endometrial polyp    Not removed as of 09/07/18  . Hyperlipidemia   . Menorrhagia with regular cycle   . SVD (spontaneous vaginal delivery)    x 3  . Vitamin D deficiency     Past Surgical History:  Procedure Laterality Date  . HYSTEROSCOPY W/D&C N/A 08/07/2013   Procedure: DILATATION AND CURETTAGE /HYSTEROSCOPY;  Surgeon: Azalia Bilis, MD;  Location: [facility];  Service: Gynecology;  Laterality: N/A;  . INTRAUTERINE DEVICE (IUD) INSERTION  09/13/2013  . LAPAROSCOPIC APPENDECTOMY N/A 01/13/2016   Procedure: APPENDECTOMY LAPAROSCOPIC;  Surgeon: Armandina Gemma, MD;  Location: [facility];  Service: General;  Laterality: N/A;  . TUBAL LIGATION     Age 48    Current Outpatient Medications  Medication Sig Dispense Refill  . Continuous Blood Gluc Receiver (FREESTYLE LIBRE READER) DEVI Inject 1 Device into the skin See admin instructions. 1 Device 0  . Continuous Blood Gluc Sensor (FREESTYLE LIBRE 14 DAY SENSOR) MISC APPLY AS DIRECTED EVERY 14 DAYS 6 each 1  . folic acid (FOLVITE) 1 MG tablet Take 2 tablets (2 mg total) by mouth daily. [place]  tablet 11  . gemfibrozil (LOPID) 600 MG tablet TAKE 1 TABLET(600 MG) BY MOUTH TWICE DAILY BEFORE A MEAL 180 tablet 3  . Insulin Glargine (LANTUS SOLOSTAR) 100 UNIT/ML Solostar Pen ADMINISTER 50 UNITS UNDER THE SKIN DAILY 15 mL 1  . Insulin Pen Needle (B-D UF III MINI PEN NEEDLES) 31G X 5 MM MISC USE DAILY AS DIRECTED WITH INSULIN 100 each 3  . Iron-FA-B Cmp-C-Biot-Probiotic (FUSION PLUS) CAPS Take 1 tablet by mouth daily. 30 capsule 11  . lisinopril (PRINIVIL,ZESTRIL) 2.5 MG tablet Take 1 tablet (2.5 mg total) by mouth daily. 90 tablet 1  . metFORMIN (GLUCOPHAGE) 1000 MG tablet TAKE 1 TABLET(1000 MG) BY MOUTH TWICE DAILY WITH A MEAL 180 tablet 3  . TRULICITY 9.60 AV/4.0JW SOPN INJECT 0.75 MG INTO THE SKIN ONCE A WEEK 2 mL 3   No current facility-administered medications for this visit.       ALLERGIES: Patient has no known allergies.  Family History  Adopted: Yes  Family history unknown: Yes    Social History   Socioeconomic History  . Marital status: Married    Spouse name: [name]  . Number of children: 3  . Years of education: none  . Highest education level: Not on file  Occupational History  . 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 Financial resource strain: Not on file  . Food insecurity:    Worry: Not on file    Inability: Not on file  . Transportation needs:    Medical: Not on file    Non-medical: Not on file  Tobacco Use  . Smoking status: Never Smoker  . Smokeless tobacco: Never Used  Substance and Sexual Activity  . Alcohol use: No    Alcohol/week: 0.0 standard drinks  . Drug use: No  . Sexual activity: Yes    Partners: Male    Birth control/protection: Surgical    Comment: Tubal  Lifestyle  . Physical activity:    Days per week: Not on file    Minutes per session: Not on file  . Stress: Not on file  Relationships  . Social connections:    Talks on phone: Not on file    Gets together: Not on file    Attends religious service: Not on file    Active member of club or organization: Not on file    Attends meetings of clubs or organizations: Not on file    Relationship status: Not on file  . Intimate partner violence:    Fear of current or ex partner: Not on file    Emotionally abused: Not on file    Physically abused: Not on file    Forced sexual activity: Not on file  Other Topics Concern  . 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      Married in 1983 and has 3 grown children, 2 boys and one girl.       Lives with husband and two boys. Daughter lives locally.   Smoke detectors in home? Yes    Guns in home? No    Consistent seatbelt use? Yes    Consistent  dental brushing and flossing? Yes    Semi-Annual dental visits: No    Annual Eye exams: No         Review of Systems  Constitutional: Negative.  HENT: Negative.   Eyes: Negative.   Respiratory: Negative.   Cardiovascular: Negative.   Gastrointestinal: Negative.   Endocrine: Negative.   Genitourinary: Positive for menstrual problem.       Heavy bleeding  Musculoskeletal: Negative.   Skin: Negative.   Allergic/Immunologic: Negative.   Neurological: Negative.   Psychiatric/Behavioral: Negative.     PHYSICAL EXAMINATION:    BP 110/64   Pulse 68   Temp 98.1 F (36.7 C) (Oral)   Resp 16   Wt 166 lb (75.3 kg)   LMP 10/15/2018 (Exact Date)   BMI 28.94 kg/m     General appearance: alert, cooperative and appears stated age   Pelvic: External genitalia:  no lesions              Urethra:  normal appearing urethra with no masses, tenderness or lesions              Bartholins and Skenes: normal                 Vagina: normal appearing vagina with normal color and discharge, no lesions              Cervix: no lesions.  Small clot at os.                Bimanual Exam:  Uterus:  normal size, contour, position, consistency, mobility, non-tender              Adnexa: no mass, fullness, tenderness           Chaperone was present for exam.  ASSESSMENT  Menorrhagia with regular menses.  Endometrial polyp.  Adenomyosis.   PLAN  Check CBC.  Start Micronor x 3 months.  Instructed in use. Will work toward hysterectomy.  Written information given in [place]. Her CBC will help to define when we proceed during Covid 19 restrictions.  I discussed quarantine for 14 days prior to surgery and Covid 19 testing at the hospital prior to surgery being done.    An After Visit Summary was printed and given to the patient.  __25____ minutes face to face time of which over 50% was spent in counseling.

## 2018-10-18 NOTE — Patient Instructions (Signed)
Thng tin v? th? thu?t c?t b? t? cung Hysterectomy Information  Th? thu?t c?t b? t? cung l m?t ph?u thu?t ?? lo?i b? t? cung. ?ng d?n tr?ng v bu?ng tr?ng c?ng c th? ???c c?t b? (th? thu?t c?t b? bu?ng tr?ng v vi tr?ng). Th? thu?t ny c?ng c th? ???c th?c hi?n ?? ?i?u tr? nh?ng v?n ?? b?nh l khc nhau. Sau khi th?c hi?n th? thu?t, n? gi?i s? khng cn cc chu k? kinh nguy?t v c?ng khng th? c thai (tri?t s?n). Nh?ng l do no d?n ??n th? thu?t c?t b? t? cung? C nhi?u l do khi?n ph? n? c th? ph?i th?c hi?n th? thu?t ny. Cc l do ? bao g?m:  Ch?y mu m ??o dai d?ng, b?t th??ng.  ?au ho?c nhi?m trng vng ch?u ko di (m?n tnh).  L?c n?i m?c t? cung. Tnh tr?ng ny x?y ra khi nim m?c t? cung (n?i m?c t? cung) b?t ??u pht tri?n ra ngoi t? cung.  L?c n?i m?c trong c? t? cung. Tnh tr?ng ny x?y ra khi n?i m?c t? cung b?t ??u pht tri?n trong c? t? cung.  Sa c? quan vng ch?u. ?y l tnh tr?ng t? cung sa xu?ng m ??o.  Cc kh?i u khng ph?i ung th? pht tri?n trong t? cung (u x? t? cung) gy ra cc tri?u ch?ng.  Xu?t hi?n cc t? bo ti?n ung th?.  Ung th? c? t? cung ho?c ung th? t? cung. C nh?ng d?ng th? thu?t c?t b? t? cung khc nhau no? C ba d?ng th? thu?t c?t b? t? cung khc nhau:  Th? thu?t c?t b? t? cung trn c? t? cung. ? d?ng ny, ph?n trn c?a t? cung b? c?t b?, nh?ng khng c?t b? c? t? cung.  Th? thu?t c?t b? t? cung ton ph?n. ? d?ng ny, t? cung v c? t? cung ??u b? c?t b?.  Th? thu?t c?t b? t? cung tri?t c?n. ? d?ng ny, t? cung, c? t? cung v m gi? t? cung t?i ch? (m c?n t? cung) ??u b? c?t b?. C th? th?c hi?n nh?ng cch c?t b? t? cung no khc? C nhi?u cch c?t b? t? cung khc nhau, bao g?m:  Th? thu?t c?t b? t? cung theo ???ng b?ng. ? d?ng ny, m?t v?t m? s? ???c t?o ra ? b?ng. T? cung ???c c?t b? qua v?t m? ny.  Th? thu?t c?t b? t? cung theo ???ng m ??o. ? d?ng ny, m?t v?t m? s? ???c t?o ra trong m ??o. T? cung ???c c?t b? qua v?t m? ny.  Khng c v?t m? ? b?ng.  Th? thu?t c?t b? t? cung b?ng ph??ng php n?i soi ? b?ng thng th??ng. ? d?ng ny, ba ho?c b?n v?t m? nh? t?o ra ? b?ng. M?t ?ng m?ng, c ?n, km theo camera (?ng soi ? b?ng) ???c ??a vo m?t trong nh?ng v?t m?. Nh?ng d?ng c? khc ???c ??a vo qua cc v?t m? khc. T? cung ???c c?t thnh t?ng m?u nh?. Cc m?u nh? ny ???c l?y ra qua cc v?t m? ho?c qua m ??o.  C?t b? t? cung qua m ??o ???c h? tr? b?ng n?i soi ? b?ng (LAVH). ? d?ng ny, ba ho?c b?n v?t m? nh? t?o ra ? b?ng. M?t ph?n ph?u thu?t ???c th?c hi?n b?ng ph??ng php n?i soi ? b?ng v m?t ph?n qua m ??o. T? cung ???c lo?i b? qua m ??o.  Th?  thu?t c?t b? t? cung b?ng ph??ng php n?i soi ? b?ng c r b?t h? tr?. ? d?ng ny, m?t ?ng soi ? b?ng v cc d?ng c? khc ???c ??a vo ba ho?c b?n v?t m? nh? ? b?ng. M?t thi?t b? ?i?u khi?n b?ng my vi tnh ???c s? d?ng ?? cung c?p cho bc s? ph?u thu?t hnh ?nh 3D v gip ki?m sot cc d?ng c? ph?u thu?t. ?i?u ny cho php d?ch chuy?n cc d?ng c? ph?u thu?t chnh xc h?n. T? cung ???c c?t thnh t?ng m?u nh? v l?y ra qua cc v?t m? ho?c qua m ??o. Trao ??i v?i chuyn gia ch?m [place] s?c kh?e v? cc ty ch?n ?? xc ??nh d?ng no ph h?p v?i qu v?. C nh?ng nguy c? no? Ni chung, ?y l m?t th? thu?t an ton. Tuy nhin, cc v?n ?? c th? x?y ra, bao g?m:  Ch?y mu v nguy c? ph?i truy?n mu. Hy cho chuyn gia ch?m [place] s?c kh?e c?a qu v? bi?t n?u qu v? khng mu?n nh?n b?t k? s?n ph?m mu no.  C?c mu ?ng ? chn ho?c ? ph?i.  Nhi?m trng.  Gy th??ng t?n ca?c c?u tru?c ho??c c? quan kha?c.  Ph?n ?ng di? ??ng v?i thu?c.  Chuy?n t? m?t trong nh?ng k? thu?t khc sang th? thu?t c?t b? t? cung qua ???ng b?ng. Nh?ng d? ki?n sau khi th?c hi?n th? thu?t c?t b? t? cung  Qu v? s? ???c cho s? d?ng thu?c gi?m ?au.  Qu v? c th? c?n n?m vi?n t? 1- 2 ngy ?? h?i ph?c, ty thu?c vo d?ng th? thu?t c?t b? t? cung c?a qu v?.  Tun th? ch? d?n c?a chuyn gia ch?m [place] s?c kh?e v?  luy?n t?p, li xe v cc ho?t ??ng chung. H?i chuyn gia ch?m [place] s?c kh?e v? cc ho?t ??ng no an ton cho qu v?.  Qu v? s? c?n ph?i c ai ? ? cng qu v? trong 3-5 ngy ??u tin sau khi v? nh.  Qu v? s? c?n ph?i g?p bc s? ph?u thu?t ?? khm theo di sau khi ph?u thu?t 2-4 tu?n ?? ?nh gi s? ti?n tri?n c?a qu v?.  N?u bu?ng tr?ng b? c?t b?, qu v? s? c cc tri?u ch?ng mn kinh s?m ch?ng h?n nh? b?c h?a, ?? m? hi ban ?m v m?t ng?.  N?u qu v? b? c?t b? t? cung do v?n ?? khng ph?i ung th? ho?c khng ph?i l m?t tnh tr?ng c th? d?n ??n ung th?, th qu v? khng c?n ph?i lm xt nghi?m ph?t t? bo c? t? cung (Pap) n?a. Tuy nhin, ngay c? khi qu v? khng c?n ph?i lm xt nghi?m ph?t t? bo c? t? cung (Pap) n?a, th qu v? nn khm vng ch?u ??nh k? ?? ??m b?o khng c v?n ?? no khc ?ang pht sinh. Ca?c cu h?i ??t ra v?i chuyn gia ch?m [place] s?c kh?e c?a qu v?  C?t b? t? cung c c?n thi?t v? m?t y [employer]? Ti c cc l?a ch?n ?i?u tr? no khc cho tnh tr?ng c?a mnh khng?  C nh?ng ty ch?n no cho th? thu?t c?t b? t? cung c?a ti?  Nh?ng c? quan v m no c?n ph?i ???c c?t b??  C nh?ng nguy c? no?  C nh?ng l?i ch no?  Ti c?n ?? la?i b?nh vi?n bao lu sau khi th?c hi?n thu? thu?t?  Ti c?n bao lu ??  ph?c h?i t?i nh?  C nh?ng tri?u ch?ng no d? ki?n s? x?y ra sau th? thu?t? Tm t?t  Th? thu?t c?t b? t? cung l m?t ph?u thu?t ?? lo?i b? t? cung. ?ng d?n tr?ng v bu?ng tr?ng c?ng c th? ???c c?t b? (th? thu?t c?t b? bu?ng tr?ng v vi tr?ng).  Th? thu?t ny c?ng c th? ???c th?c hi?n ?? ?i?u tr? nh?ng v?n ?? b?nh l khc nhau. Sau khi lm xong th? thu?t ny, n? gi?i s? khng cn cc chu k kinh nguy?t v c?ng s? khng th? c [place].  Trao ??i v?i chuyn gia ch?m [place] s?c kh?e v? cc ty ch?n ?? xc ??nh d?ng th? thu?t c?t b? t? cung no ph h?p v?i qu v?. Thng tin ny khng nh?m m?c ?ch thay th? cho l?i khuyn m chuyn gia ch?m [place] s?c kh?e ni v?i qu  v?. Hy b?o ??m qu v? ph?i th?o lu?n b?t k? v?n ?? g m qu v? c v?i chuyn gia ch?m  s?c kh?e c?a qu v?. Document Released: 05/26/2011 Document Revised: 01/31/2017 Document Reviewed: 01/31/2017 Elsevier Interactive Patient Education  2019 [employer].

## 2018-10-19 LAB — CBC
Hematocrit: 27.8 % — ABNORMAL LOW (ref 34.0–46.6)
Hemoglobin: 8.9 g/dL — ABNORMAL LOW (ref 11.1–15.9)
MCH: 24.6 pg — ABNORMAL LOW (ref 26.6–33.0)
MCHC: 32 g/dL (ref 31.5–35.7)
MCV: 77 fL — ABNORMAL LOW (ref 79–97)
Platelets: 209 10*3/uL (ref 150–450)
RBC: 3.62 x10E6/uL — ABNORMAL LOW (ref 3.77–5.28)
RDW: 12.8 % (ref 11.7–15.4)
WBC: 9.5 10*3/uL (ref 3.4–10.8)

## 2018-10-23 ENCOUNTER — Encounter: Payer: Self-pay | Admitting: Family

## 2018-10-23 ENCOUNTER — Inpatient Hospital Stay: Payer: BLUE CROSS/BLUE SHIELD | Attending: Family | Admitting: Family

## 2018-10-23 ENCOUNTER — Other Ambulatory Visit: Payer: Self-pay

## 2018-10-23 ENCOUNTER — Inpatient Hospital Stay: Payer: BLUE CROSS/BLUE SHIELD

## 2018-10-23 ENCOUNTER — Telehealth: Payer: Self-pay | Admitting: *Deleted

## 2018-10-23 VITALS — BP 111/67 | HR 79 | Temp 98.0°F | Wt 164.0 lb

## 2018-10-23 DIAGNOSIS — E538 Deficiency of other specified B group vitamins: Secondary | ICD-10-CM

## 2018-10-23 DIAGNOSIS — D565 Hemoglobin E-beta thalassemia: Secondary | ICD-10-CM | POA: Insufficient documentation

## 2018-10-23 DIAGNOSIS — Z79899 Other long term (current) drug therapy: Secondary | ICD-10-CM | POA: Diagnosis not present

## 2018-10-23 DIAGNOSIS — N920 Excessive and frequent menstruation with regular cycle: Secondary | ICD-10-CM | POA: Diagnosis not present

## 2018-10-23 DIAGNOSIS — D500 Iron deficiency anemia secondary to blood loss (chronic): Secondary | ICD-10-CM | POA: Diagnosis present

## 2018-10-23 DIAGNOSIS — D508 Other iron deficiency anemias: Secondary | ICD-10-CM

## 2018-10-23 LAB — CBC WITH DIFFERENTIAL (CANCER CENTER ONLY)
Abs Immature Granulocytes: 0.03 10*3/uL (ref 0.00–0.07)
Basophils Absolute: 0.1 10*3/uL (ref 0.0–0.1)
Basophils Relative: 1 %
Eosinophils Absolute: 0.4 10*3/uL (ref 0.0–0.5)
Eosinophils Relative: 5 %
HCT: 29.6 % — ABNORMAL LOW (ref 36.0–46.0)
Hemoglobin: 9.2 g/dL — ABNORMAL LOW (ref 12.0–15.0)
Immature Granulocytes: 0 %
Lymphocytes Relative: 25 %
Lymphs Abs: 1.9 10*3/uL (ref 0.7–4.0)
MCH: 24 pg — ABNORMAL LOW (ref 26.0–34.0)
MCHC: 31.1 g/dL (ref 30.0–36.0)
MCV: 77.1 fL — ABNORMAL LOW (ref 80.0–100.0)
Monocytes Absolute: 0.6 10*3/uL (ref 0.1–1.0)
Monocytes Relative: 7 %
Neutro Abs: 4.9 10*3/uL (ref 1.7–7.7)
Neutrophils Relative %: 62 %
Platelet Count: 296 10*3/uL (ref 150–400)
RBC: 3.84 MIL/uL — ABNORMAL LOW (ref 3.87–5.11)
RDW: 12.9 % (ref 11.5–15.5)
WBC Count: 7.8 10*3/uL (ref 4.0–10.5)
nRBC: 0 % (ref 0.0–0.2)

## 2018-10-23 LAB — CMP (CANCER CENTER ONLY)
ALT: 19 U/L (ref 0–44)
AST: 28 U/L (ref 15–41)
Albumin: 4.4 g/dL (ref 3.5–5.0)
Alkaline Phosphatase: 59 U/L (ref 38–126)
Anion gap: 9 (ref 5–15)
BUN: 17 mg/dL (ref 6–20)
CO2: 25 mmol/L (ref 22–32)
Calcium: 9.9 mg/dL (ref 8.9–10.3)
Chloride: 104 mmol/L (ref 98–111)
Creatinine: 0.72 mg/dL (ref 0.44–1.00)
GFR, Est AFR Am: >60 mL/min (ref >60)
GFR, Estimated: >60 mL/min (ref >60)
Glucose, Bld: 127 mg/dL — ABNORMAL HIGH (ref 70–99)
Potassium: 4.4 mmol/L (ref 3.5–5.1)
Sodium: 138 mmol/L (ref 135–145)
Total Bilirubin: 0.4 mg/dL (ref 0.3–1.2)
Total Protein: 8 g/dL (ref 6.5–8.1)

## 2018-10-23 LAB — IRON AND TIBC
Iron: 22 ug/dL — ABNORMAL LOW (ref 41–142)
Saturation Ratios: 4 % — ABNORMAL LOW (ref 21–57)
TIBC: 621 ug/dL — ABNORMAL HIGH (ref 236–444)
UIBC: 599 ug/dL — ABNORMAL HIGH (ref 120–384)

## 2018-10-23 LAB — RETICULOCYTES
Immature Retic Fract: 19.9 % — ABNORMAL HIGH (ref 2.3–15.9)
RBC.: 3.82 MIL/uL — ABNORMAL LOW (ref 3.87–5.11)
Retic Count, Absolute: 73.3 10*3/uL (ref 19.0–186.0)
Retic Ct Pct: 1.9 % (ref 0.4–3.1)

## 2018-10-23 LAB — FERRITIN: Ferritin: <4 ng/mL — ABNORMAL LOW (ref 11–307)

## 2018-10-23 NOTE — Progress Notes (Signed)
Hematology and Oncology Follow Up Visit  Ajwa Kimberley [id number] 08-Feb-1971 48 y.o. 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 Brissette is here today for follow-up with her son who is her interpretor. She was on her cycle last week which was heavy.  She is symptomatic with fatigue, lightheadedness, palpitations and occasional chest discomfort. She takes a baby aspirin which helps resolve the chest discomfort.  Hgb is 9.2, MCV 77 and platelet count 296. Iron studies are pending.  She has had no other episodes of bleeding, no bruising or petechiae.  No fever, chills, n/v, cough, rash, SOB, abdominal pain or changes in bowel or bladder habits.  No swelling, numbness or tingling in her extremities.  She has intermittent joint pain in her hands.  No lymphadenopathy noted on exam.  She has a good appetite but eats little meat. She prefers vegetables. She is staying hydrated. Her weight is stable.   ECOG Performance Status: 1 - Symptomatic but completely ambulatory  Medications:  Allergies as of 10/23/2018   No Known Allergies     Medication List       Accurate as of Oct 23, 2018  9:50 AM. Always use your most recent med list.        folic acid 1 MG tablet Commonly known as:  FOLVITE Take 2 tablets (2 mg total) by mouth daily.   FreeStyle Libre 14 Day Sensor Misc APPLY AS DIRECTED EVERY 14 DAYS   FreeStyle Libre Reader Devi Inject 1 Device into the skin See admin instructions.   Fusion Plus Caps Take 1 tablet by mouth daily.   gemfibrozil 600 MG tablet Commonly known as:  LOPID TAKE 1 TABLET(600 MG) BY MOUTH TWICE DAILY BEFORE A MEAL   Insulin Glargine 100 UNIT/ML Solostar Pen Commonly known as:  Lantus SoloStar ADMINISTER 50 UNITS UNDER THE SKIN DAILY   Insulin Pen Needle 31G X 5 MM Misc Commonly known as:   B-D UF III MINI PEN NEEDLES USE DAILY AS DIRECTED WITH INSULIN   lisinopril 2.5 MG tablet Commonly known as:  ZESTRIL Take 1 tablet (2.5 mg total) by mouth daily.   metFORMIN 1000 MG tablet Commonly known as:  GLUCOPHAGE TAKE 1 TABLET(1000 MG) BY MOUTH TWICE DAILY WITH A MEAL   norethindrone 0.35 MG tablet Commonly known as:  MICRONOR Take 1 tablet (0.35 mg total) by mouth daily.   Trulicity 4.09 WJ/1.9JY Sopn Generic drug:  Dulaglutide INJECT 0.75 MG INTO THE SKIN ONCE A WEEK       Allergies: No Known Allergies  Past Medical History, Surgical history, Social history, and Family History were reviewed and updated.  Review of Systems: All other 10 point review of systems is negative.   Physical Exam:  weight is 164 lb (74.4 kg). Her oral temperature is 98 F (36.7 C). Her blood pressure is 111/67 and her pulse is 79. Her oxygen saturation is 100%.   Wt Readings from Last 3 Encounters:  10/23/18 164 lb (74.4 kg)  10/18/18 166 lb (75.3 kg)  09/06/18 166 lb (75.3 kg)    Ocular: Sclerae unicteric, pupils equal, round and reactive to light Ear-nose-throat: Oropharynx clear, dentition fair Lymphatic: No cervical or supraclavicular adenopathy Lungs no rales or rhonchi, good excursion bilaterally Heart regular rate and rhythm, no murmur appreciated Abd soft, nontender, positive  bowel sounds, no liver or spleen tip palpated on exam, no fluid wave  MSK no focal spinal tenderness, no joint edema Neuro: non-focal, well-oriented, appropriate affect Breasts: Deferred   Lab Results  Component Value Date   WBC 7.8 10/23/2018   HGB 9.2 (L) 10/23/2018   HCT 29.6 (L) 10/23/2018   MCV 77.1 (L) 10/23/2018   PLT 296 10/23/2018   Lab Results  Component Value Date   FERRITIN 8 (L) 09/06/2018   IRON 49 09/06/2018   TIBC 596 (H) 05/23/2018   UIBC 559 (H) 05/23/2018   IRONPCTSAT 6 (L) 05/23/2018   Lab Results  Component Value Date   RETICCTPCT 1.9 10/23/2018   RBC 3.82 (L)  10/23/2018   No results found for: KPAFRELGTCHN, LAMBDASER, KAPLAMBRATIO No results found for: IGGSERUM, IGA, IGMSERUM No results found for: Odetta Pink, SPEI   Chemistry      Component Value Date/Time   NA 139 04/19/2018 1137   K 5.0 04/19/2018 1137   CL 103 04/19/2018 1137   CO2 21 04/19/2018 1137   BUN 15 04/19/2018 1137   CREATININE 0.75 04/19/2018 1137   CREATININE 0.57 03/08/2016 0901   GLU 187 04/18/2011      Component Value Date/Time   CALCIUM 10.1 04/19/2018 1137   ALKPHOS 56 04/19/2018 1137   AST 36 04/19/2018 1137   ALT 30 04/19/2018 1137   BILITOT 0.3 04/19/2018 1137       Impression and Plan: Ms. Liwanag is a very pleasant 48 yo [name] with Hemoglobin E and iron deficiency secondary to heavy cycles.  She is waiting to be scheduled for hysterectomy. This is on hold due to the corona virus.  She is symptomatic as mentioned above. We will see what her iron studies show and bring her back in for infusion if needed. We discussed changing to IV iron from oral if needed and she is in agreement with this.   We will go ahead and plan to see her back in another 6 weeks.  They will contact our office with any questions or concerns. We can certainly see her sooner if need be.    Laverna Peace, NP 5/5/20209:50 AM

## 2018-10-23 NOTE — Telephone Encounter (Signed)
Call to patient with [place] number [phone]- Shelley Escobar. Left message for patient to call back to triage nurse.    Called Dr Antonieta Pert office to schedule appointment. Patient was actually seen for appointment today. Update provided to [facility] regarding Hgb, recommend for potential iron infusion and plan for hysterectomy when surgical  Restrictions lifted at hospital ( Covid 19). Kenney Houseman will update Malaka Ruffner NP and they will contact patient directly.

## 2018-10-23 NOTE — Telephone Encounter (Signed)
-----   Message from Nunzio Cobbs, MD sent at 10/19/2018 10:48 AM EDT ----- Please contact patient in follow up to her visit yesterday.  You will need an interpretor.   Let her know that her hemoglobin is 8.9.  She has chronic anemia and sees Dr. Marin Olp.    She has menorrhagia, an endometrial polyp, a small fibroid, and suspected adenomyosis.  She is anxious to proceed with hysterectomy.  I recommend we move forward with this plan for laparoscopic hysterectomy with bilateral salpingectomy and cystoscopy.  It would be beneficial for her to have an iron transfusion prior to surgery.   I just started her on Micronor this week.

## 2018-10-24 ENCOUNTER — Telehealth: Payer: Self-pay | Admitting: Family

## 2018-10-24 NOTE — Telephone Encounter (Signed)
Patient's son "[name]" is returning a call. DPR on file to talk with [name].

## 2018-10-24 NOTE — Telephone Encounter (Signed)
Appointments scheduled and patient to get updated schedule at 5/8 visit per 5/5 los

## 2018-10-25 NOTE — Telephone Encounter (Signed)
Please try to schedule the patient for laparoscopic hysterectomy for the week of May 18.  She will need a preop with an interpretor present for the visit.   Jay

## 2018-10-25 NOTE — Telephone Encounter (Signed)
Spoke with patients son "[name]", ok per dpr. Advised of results as seen below per Dr. Quincy Simmonds. Patient is scheduled for iron infusion on 5/8 and 5/15. Started Micronor as directed, reports no bleeding at this time. Advised son our office will return call with more information regarding surgery scheduling and availability. Son verbalizes understanding and is agreeable.   Routing to Dr. Antony Blackbird.   Cc: Lamont Snowball, RN

## 2018-10-26 ENCOUNTER — Ambulatory Visit: Payer: BLUE CROSS/BLUE SHIELD

## 2018-10-26 ENCOUNTER — Inpatient Hospital Stay: Payer: BLUE CROSS/BLUE SHIELD

## 2018-10-26 ENCOUNTER — Other Ambulatory Visit: Payer: Self-pay

## 2018-10-26 VITALS — BP 102/61 | HR 81 | Temp 98.3°F | Resp 17

## 2018-10-26 DIAGNOSIS — D500 Iron deficiency anemia secondary to blood loss (chronic): Secondary | ICD-10-CM | POA: Diagnosis not present

## 2018-10-26 DIAGNOSIS — D508 Other iron deficiency anemias: Secondary | ICD-10-CM

## 2018-10-26 MED ORDER — SODIUM CHLORIDE 0.9 % IV SOLN
510.0000 mg | Freq: Once | INTRAVENOUS | Status: AC
  Administered 2018-10-26: 510 mg via INTRAVENOUS
  Filled 2018-10-26: qty 17

## 2018-10-26 MED ORDER — SODIUM CHLORIDE 0.9 % IV SOLN
Freq: Once | INTRAVENOUS | Status: AC
  Administered 2018-10-26: 12:00:00 via INTRAVENOUS
  Filled 2018-10-26: qty 250

## 2018-10-26 NOTE — Patient Instructions (Signed)
Ferumoxytol injection ?y l thu?c g? FERUMOXYTOL l m?t ph?c h?p s?t. S?t ???c dng ?? t?o thnh cc t? bo h?ng c?u kh?e m?nh, chuyn ch? oxygen v d??ng ch?t ?i kh?p c? th?. Thu?c ny ???c dng ?? ?i?u tr? b?nh thi?u mu do thi?u s?t. Thu?c ny c th? ???c dng cho nh?ng m?c ?ch khc; hy h?i ng??i cung c?p d?ch v? y t? ho?c d??c s? c?a mnh, n?u qu v? c th?c m?c. (CC) NHN HI?U PH? BI?N: Feraheme Ti c?n ph?i bo cho ng??i cung c?p d?ch v? y t? c?a mnh ?i?u g tr??c khi dng thu?c ny? H? c?n bi?t li?u qu v? c b?t k? tnh tr?ng no sau ?y khng: -thi?u mu khng ph?i do l??ng s?t th?p -hm l??ng s?t trong mu cao -? ???c x?p l?ch ?? ch?p c?ng h??ng t? (MRI) -pha?n ??ng b?t th???ng ho??c di? ??ng v??i s?t -pha?n ??ng b?t th???ng ho??c di? ??ng v??i ca?c d??c ph?m kha?c -pha?n ??ng b?t th???ng ho??c di? ??ng v??i th??c ph?m, thu?c nhu?m, ho??c ch?t ba?o qua?n -?ang c thai ho??c ??nh co? thai -?ang cho con bu? Ti nn s? d?ng thu?c ny nh? th? no? Thu?c ny ?? tim vo t?nh m?ch. Thu?c ny ???c s? d?ng b?i chuyn vin y t? ? b?nh vi?n ho?c ? phng m?ch. Hy bn v?i bc s? nhi khoa c?a qu v? v? vi?c dng thu?c ny ? tr? em. C th? c?n ch?m [place] ??c bi?t. Qu li?u: N?u qu v? cho r?ng mnh ? dng qu nhi?u thu?c ny, th hy lin l?c v?i trung tm ki?m sot ch?t ??c ho?c phng c?p c?u ngay l?p t?c. L?U : Thu?c ny ch? dnh ring cho qu v?. Khng chia s? thu?c ny v?i nh?ng ng??i khc. N?u ti l? qun m?t li?u th sao? ?i?u quan tr?ng l khng nn b? l? li?u thu?c no. Hy lin l?c v?i bc s? ho?c [place] vin y t? c?a mnh, n?u qu v? khng th? gi? ?ng cu?c h?n khm. Nh?ng g c th? t??ng tc v?i thu?c ny? Thu?c ny c th? t??ng tc v?i cc thu?c sau ?y: -cc ch? ph?m khc c?a ch?t s?t Dan[facility] sch ny c th? khng m t? ?? h?t cc t??ng tc c th? x?y ra. Hy ??a cho ng??i cung c?p d?ch v? y t? c?a mnh danh sch t?t c? cc thu?c, th?o d??c, cc thu?c khng c?n toa, ho?c  cc ch? ph?m b? sung m qu v? dng. C?ng nn bo cho h? bi?t r?ng qu v? c ht thu?c, u?ng r??u, ho?c c s? d?ng ma ty tri php hay khng. Vi th? c th? t??ng tc v?i thu?c c?a qu v?. Ti c?n ph?i theo di ?i?u g trong khi dng thu?c ny? Hy ??n g?p bc s? ho?c [place] vin y t? ?? theo di ??nh k? s?c kh?e c?a mnh. Hy bo cho bc s? ho?c chuyn vin y t?, n?u cc tri?u ch?ng c?a qu v? khng kh h?n, ho?c tr? nn n?ng h?n. Qu v? s? c?n ?i lm cc xt nghi?m mu ??nh k? trong khi qu v? dng thu?c ny. Qu v? c th? c?n ph?i tun theo m?t ch? ?? ?n ??c bi?t. Hy ni v?i v?i bc s?Marland Kitchen Nh?ng th?c ph?m c ch?a s?t bao g?m: ng? c?c nguyn h?t, cc lo?i tri cy s?y kh, cc lo?i ??u, ho?c ??u h lan (pea), cc lo?i rau l xanh, v ph? t?ng (gan, c?t). Ti c th? nh?n th?y nh?ng tc d?ng ph? no  khi dng thu?c ny? Nh?ng tc d?ng ph? qu v? c?n ph?i bo cho bc s? ho?c chuyn vin y t? cng s?m cng t?t: -cc ph?n ?ng d? ?ng, ch?ng h?n nh? da b? m?n ??, ng?a, n?i my ?ay, s?ng ? m?t, mi, ho?c l??i -kh th? -thay ??i huy?t p -c?m th?y chong vng, ng?t x?u, b? t -s?t ho?c ?n l?nh -?? b?ng da, ?? m? hi, ho?c c?m th?y nng -s?ng ? m?t c chn ho?c bn chn Cc tc d?ng ph? khng c?n ph?i ch?m [place] y t? (hy bo cho bc s? ho?c chuyn vin y t?, n?u cc tc d?ng ph? ny ti?p di?n ho?c gy phi?n toi): -tiu ch?y -?au ??u -bu?n i ho?c i m?a -?au b?ng Danh sch ny c th? khng m t? ?? h?t cc tc d?ng ph? c th? x?y ra. Xin g?i t?i bc s? c?a mnh ?? ???c c? v?n chuyn mn v? cc tc d?ng ph?Sander Nephew v? c th? t??ng trnh cc tc d?ng ph? cho FDA theo s? [phone]. Ti nn c?t gi? thu?c c?a mnh ? ?u? Thu?c ny ???c s? d?ng b?i chuyn vin y t? ? b?nh vi?n ho?c ? phng m?ch. Qu v? s? khng ???c c?p thu?c ny ?? c?t gi? t?i nh. L?U : ?y l b?n tm t?t. N c th? khng bao hm t?t c? thng tin c th? c. N?u qu v? th?c m?c v? thu?c ny, xin trao ??i v?i bc s?, d??c s?, ho?c ng??i cung  c?p d?ch v? y t? c?a mnh.  2019 Elsevier/Gold Standard (2016-09-27 00:00:00)

## 2018-10-29 NOTE — Telephone Encounter (Signed)
Call to patient- Interpretor Doorothy ID number [id number] on line for call. Patient's son [name] answered. Advised surgery scheduled for 11-13-18 at 0855 at [facility]. Surgery consult scheduled for 11-01-2018 and will review instructions at that time, with interpretor.   Routing to Dr Quincy Simmonds. Encounter closed.

## 2018-10-31 ENCOUNTER — Other Ambulatory Visit: Payer: Self-pay

## 2018-10-31 NOTE — Progress Notes (Signed)
GYNECOLOGY  VISIT   HPI: 48 y.o.   Married  Asian  female   [phone] with Patient's last menstrual period was 10/15/2018 (exact date).   here for  Surgical consult.  Her son is here and interpreting today.   She has heavy menses, an endometrial polyp, and suspected adenomyosis. She is waiting to proceed with hysterectomy.  She declines future childbearing and wishes for a definitive surgical procedure.  Patient has heavy menses and using 2 pads at at time.  Her bleeding is heavy enough to stain the sheets.  Has stated in past she does not have pain.  Patient was started on Micronor on 10/18/18 visit, but did not take them.  She has not had any bleeding since her last menses.  She used an Mirena IUD in the past, and it was expelled.  She has chronic anemia managed by oncology.  She usually has Hgb in the 8 - 10 range.  Taking iron twice daily, Fusion plus.  She had an iron infusion on 10/26/18 and has another planned for tomorrow. Hgb 9.2 on 10/23/18.   A1C 6.5 on 09/06/18.  Glucose is 52 this morning, and she feels like she is low.   Patient and son indicate that our office is not a preferred provider for her health insurance. The patient wishes to proceed with me performing surgery.   GYNECOLOGIC HISTORY: Patient's last menstrual period was 10/15/2018 (exact date). Contraception:  Tubal Menopausal hormone therapy:  none Last mammogram:  09-18-17 density b, birads 1:neg Last pap smear:   06-14-18 neg HPV HR neg, 06-07-16 neg        OB History    Gravida  3   Para  3   Term  3   Preterm      AB      Living  3     SAB      TAB      Ectopic      Multiple      Live Births  3              Patient Active Problem List   Diagnosis Date Noted  . Menorrhagia with regular cycle 10/18/2018  . Endometrial polyp 10/18/2018  . Iron deficiency anemia secondary to inadequate dietary iron intake 05/23/2018  . IDA (iron deficiency anemia) 05/23/2018  . Uncontrolled  type 2 diabetes mellitus without complication, with long-term current use of insulin ([place]) 01/03/2018  . Fatty infiltration of liver 03/10/2016  . Anemia 03/10/2016  . Obesity (BMI 30.0-34.9) 01/27/2012  . Dyslipidemia 07/02/2011    Past Medical History:  Diagnosis Date  . Acute appendicitis 01/13/2016  . Anemia    hx  . DM (diabetes mellitus) ([place])   . Endometrial polyp    Not removed as of 09/07/18  . Hyperlipidemia   . Menorrhagia with regular cycle   . SVD (spontaneous vaginal delivery)    x 3  . Vitamin D deficiency     Past Surgical History:  Procedure Laterality Date  . HYSTEROSCOPY W/D&C N/A 08/07/2013   Procedure: DILATATION AND CURETTAGE /HYSTEROSCOPY;  Surgeon: Azalia Bilis, MD;  Location: [facility];  Service: Gynecology;  Laterality: N/A;  . INTRAUTERINE DEVICE (IUD) INSERTION  09/13/2013  . LAPAROSCOPIC APPENDECTOMY N/A 01/13/2016   Procedure: APPENDECTOMY LAPAROSCOPIC;  Surgeon: Armandina Gemma, MD;  Location: [facility];  Service: General;  Laterality: N/A;  . TUBAL LIGATION     Age 44    Current Outpatient Medications  Medication Sig Dispense  Refill  . aspirin EC 81 MG tablet Take 81 mg by mouth 3 (three) times a week.    . Continuous Blood Gluc Receiver (FREESTYLE LIBRE READER) DEVI Inject 1 Device into the skin See admin instructions. 1 Device 0  . Continuous Blood Gluc Sensor (FREESTYLE LIBRE 14 DAY SENSOR) MISC APPLY AS DIRECTED EVERY 14 DAYS 6 each 1  . gemfibrozil (LOPID) 600 MG tablet TAKE 1 TABLET(600 MG) BY MOUTH TWICE DAILY BEFORE A MEAL 180 tablet 3  . Insulin Glargine (LANTUS SOLOSTAR) 100 UNIT/ML Solostar Pen ADMINISTER 50 UNITS UNDER THE SKIN DAILY (Patient taking differently: Inject 40 Units into the skin at bedtime. ADMINISTER 40 UNITS UNDER THE SKIN DAILY) 15 mL 1  . Insulin Pen Needle (B-D UF III MINI PEN NEEDLES) 31G X 5 MM MISC USE DAILY AS DIRECTED WITH INSULIN 100 each 3  . lisinopril (PRINIVIL,ZESTRIL) 2.5 MG tablet Take 1 tablet (2.5 mg total) by  mouth daily. 90 tablet 1  . metFORMIN (GLUCOPHAGE) 1000 MG tablet TAKE 1 TABLET(1000 MG) BY MOUTH TWICE DAILY WITH A MEAL 180 tablet 3  . TRULICITY 3.41 DQ/2.2WL SOPN INJECT 0.75 MG INTO THE SKIN ONCE A WEEK (Patient taking differently: Inject 0.75 mg into the skin every Friday. ) 2 mL 3  . folic acid (FOLVITE) 1 MG tablet Take 2 tablets (2 mg total) by mouth daily. 60 tablet 11   No current facility-administered medications for this visit.      ALLERGIES: Patient has no known allergies.  Family History  Adopted: Yes  Family history unknown: Yes    Social History   Socioeconomic History  . Marital status: Married    Spouse name: [name]  . Number of children: 3  . Years of education: none  . Highest education level: Not on file  Occupational History  . 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 Financial resource strain: Not on file  . Food insecurity:    Worry: Not on file    Inability: Not on file  . Transportation needs:    Medical: Not on file    Non-medical: Not on file  Tobacco Use  . Smoking status: Never Smoker  . Smokeless tobacco: Never Used  Substance and Sexual Activity  . Alcohol use: No    Alcohol/week: 0.0 standard drinks  . Drug use: No  . Sexual activity: Yes    Partners: Male    Birth control/protection: Surgical    Comment: Tubal  Lifestyle  . Physical activity:    Days per week: Not on file    Minutes per session: Not on file  . Stress: Not on file  Relationships  . Social connections:    Talks on phone: Not on file    Gets together: Not on file    Attends religious service: Not on file    Active member of club or organization: Not on file    Attends meetings of clubs or organizations: Not on file    Relationship status: Not on file  . Intimate partner violence:    Fear of current or ex partner: Not on file    Emotionally abused: Not on file    Physically abused: Not  on file    Forced sexual activity: Not on file  Other Topics Concern  . Not on file  Social History Narrative   Was a "war baby," adopted, never attended school and doesn't know anything  about her family history.      Married in 1983 and has 3 grown children, 2 boys and one girl.       Lives with husband and two boys. Daughter lives locally.   Smoke detectors in home? Yes    Guns in home? No    Consistent seatbelt use? Yes    Consistent dental brushing and flossing? Yes    Semi-Annual dental visits: No    Annual Eye exams: No         Review of Systems  Constitutional:       Glucose level on the lower side, dizziness  HENT: Negative.   Eyes: Negative.   Respiratory: Negative.   Cardiovascular: Negative.   Gastrointestinal: Negative.   Endocrine: Negative.   Genitourinary: Negative.   Musculoskeletal: Negative.   Skin: Negative.   Allergic/Immunologic: Negative.   Neurological: Negative.   Psychiatric/Behavioral: Negative.     PHYSICAL EXAMINATION:    BP 100/64   Pulse 68   Temp 97.9 F (36.6 C) (Oral)   Resp 16   Wt 166 lb (75.3 kg)   LMP 10/15/2018 (Exact Date)   BMI 28.94 kg/m     General appearance: alert, cooperative and appears stated age Head: Normocephalic, without obvious abnormality, atraumatic Neck: no adenopathy, supple, symmetrical, trachea midline and thyroid normal to inspection and palpation Lungs: clear to auscultation bilaterally Heart: regular rate and rhythm Abdomen: soft, non-tender, no masses,  no organomegaly Extremities: extremities normal, atraumatic, no cyanosis or edema Skin: Skin color, texture, turgor normal. No rashes or lesions Lymph nodes: Cervical, supraclavicular, and axillary nodes normal. No abnormal inguinal nodes palpated Neurologic: Grossly normal  Pelvic: Deferred.  Had recent exam.   Chaperone was present for exam.  ASSESSMENT  Menorrhagia with regular menses.  Endometrial polyp.  Adenomyosis.  DM. Status post  appendectomy with nonruptured appendix.   PLAN  I discussed total laparoscopic hysterectomy with bilateral salpingectomy and possible bilateral oophorectomy is abnormal, cystoscopy.    I reviewed risks, benefits, and alternatives.  Risks include but are not limited to bleeding, infection, damage to surrounding organs, pneumonia, reaction to anesthesia, DVT, PE, death, need for reoperation, hernia formation, vaginal cuff dehiscence, and need to convert to a traditional laparotomy incision to complete the procedure.    Surgical expectations and recovery discussed.  Patient wishes to proceed. She will receive Lovenox for DVT/PE prophylaxis.  She will complete her next iron transfusion.   Questions invited and answered.  Patient declines referral to a provider who is in her insurance network.  An After Visit Summary was printed and given to the patient.  _25_____ minutes face to face time of which over 50% was spent in counseling.

## 2018-11-01 ENCOUNTER — Ambulatory Visit: Payer: BLUE CROSS/BLUE SHIELD | Admitting: Obstetrics and Gynecology

## 2018-11-01 ENCOUNTER — Encounter: Payer: Self-pay | Admitting: Obstetrics and Gynecology

## 2018-11-01 ENCOUNTER — Other Ambulatory Visit: Payer: Self-pay

## 2018-11-01 VITALS — BP 100/64 | HR 68 | Temp 97.9°F | Resp 16 | Wt 166.0 lb

## 2018-11-01 DIAGNOSIS — N920 Excessive and frequent menstruation with regular cycle: Secondary | ICD-10-CM

## 2018-11-01 DIAGNOSIS — N840 Polyp of corpus uteri: Secondary | ICD-10-CM | POA: Diagnosis not present

## 2018-11-02 ENCOUNTER — Other Ambulatory Visit: Payer: Self-pay

## 2018-11-02 ENCOUNTER — Inpatient Hospital Stay: Payer: BLUE CROSS/BLUE SHIELD

## 2018-11-02 VITALS — BP 105/58 | HR 79 | Temp 98.0°F | Resp 18

## 2018-11-02 DIAGNOSIS — D508 Other iron deficiency anemias: Secondary | ICD-10-CM

## 2018-11-02 DIAGNOSIS — D500 Iron deficiency anemia secondary to blood loss (chronic): Secondary | ICD-10-CM | POA: Diagnosis not present

## 2018-11-02 MED ORDER — SODIUM CHLORIDE 0.9 % IV SOLN
510.0000 mg | Freq: Once | INTRAVENOUS | Status: AC
  Administered 2018-11-02: 510 mg via INTRAVENOUS
  Filled 2018-11-02: qty 17

## 2018-11-02 MED ORDER — SODIUM CHLORIDE 0.9 % IV SOLN
Freq: Once | INTRAVENOUS | Status: AC
  Administered 2018-11-02: 13:00:00 via INTRAVENOUS
  Filled 2018-11-02: qty 250

## 2018-11-02 NOTE — Patient Instructions (Signed)

## 2018-11-05 ENCOUNTER — Encounter (HOSPITAL_BASED_OUTPATIENT_CLINIC_OR_DEPARTMENT_OTHER): Payer: Self-pay | Admitting: Obstetrics and Gynecology

## 2018-11-06 NOTE — Patient Instructions (Addendum)
YOU ARE REQUIRED TO BE TESTED FOR COVID-19 PRIOR TO YOUR SURGERY . YOUR TEST MUST BE COMPLETED ON Friday Nov 09, 2018. TESTING IS LOCATED AT [facility] FROM 9:00AM - 3:00PM. FAILURE TO COMPLETE TESTING MAY RESULT IN CANCELLATION OF YOUR SURGERY.       [place]  11/06/2018      Your procedure is scheduled on: 11-13-2018   Report to [facility]  at  6:45 A.M.   Call this number if you have problems the morning of surgery:[phone]      OUR ADDRESS IS [address], WE ARE LOCATED IN THE [facility]ICAL [facility] WITH [facility].   Remember:  Do not eat food After Midnight. YOU MAY HAVE CLEAR LIQUIDS FROM MIDNIGHT UNTIL 4:30AM. At 4:30AM Please finish the prescribed Pre-Surgery drink. Nothing by mouth after you finish the drink !    Take these medicines the morning of surgery with A SIP OF WATER: None   WHAT TO DO ABOUT DIABETES? THE NIGHT BEFORE SURGERY , ONLY TAKE HALF DOSE OF INSULIN GLARGINE (LANTUS)  DO NOT TAKE ANY ORAL DIABETES MEDICATION THE DAY OF SURGERY! DO NOT TAKE TRULICITY OR METFORMIN THE MORNING OF SURGERY! PLEASE CHECK YOUR BLOOD SUGAR THE DAY OF SURGERY. REPORT TO YOUR NURSE ON ARRIVAL.  IF YOUR BLOOD SUGAR IS LESS THAN 70 THE MORNING OF SURGERY WHEN YOU CHECK IT AT HOME, YOU NEED TO CALL THE [phone] FOR FURTHER INSTRUCTIONS!    Do not wear jewelry, make-up or nail polish.  Do not wear lotions, powders, or perfumes, or deoderant.  Do not shave 48 hours prior to surgery.  Men may shave face and neck.  Do not bring valuables to the hospital.  [facility] is not responsible for any belongings or valuables.  Contacts, dentures or bridgework may not be worn into surgery.  Leave your suitcase in the car.  After surgery it may be brought to your room.  For patients admitted to the hospital, discharge time will be determined by your treatment team.  Patients discharged the day of surgery will  not be allowed to drive home.   Special instructions:  n/a  Please read over the following fact sheets that you were given:     [facility] - Preparing for Surgery Before surgery, you can play an important role.  Because skin is not sterile, your skin needs to be as free of germs as possible.  You can reduce the number of germs on your skin by washing with CHG (chlorahexidine gluconate) soap before surgery.  CHG is an antiseptic cleaner which kills germs and bonds with the skin to continue killing germs even after washing. Please DO NOT use if you have an allergy to CHG or antibacterial soaps.  If your skin becomes reddened/irritated stop using the CHG and inform your nurse when you arrive at Short Stay. Do not shave (including legs and underarms) for at least 48 hours prior to the first CHG shower.  You may shave your face/neck. Please follow these instructions carefully:  1.  Shower with CHG Soap the night before surgery and the  morning of Surgery.  2.  If you choose to wash your hair, wash your hair first as usual with your  normal  shampoo.  3.  After you shampoo, rinse your hair and body thoroughly to remove the  shampoo.  4.  Use CHG as you would any other liquid soap.  You can apply chg directly  to the skin and wash                       Gently with a scrungie or clean washcloth.  5.  Apply the CHG Soap to your body ONLY FROM THE NECK DOWN.   Do not use on face/ open                           Wound or open sores. Avoid contact with eyes, ears mouth and genitals (private parts).                       Wash face,  Genitals (private parts) with your normal soap.             6.  Wash thoroughly, paying special attention to the area where your surgery  will be performed.  7.  Thoroughly rinse your body with warm water from the neck down.  8.  DO NOT shower/wash with your normal soap after using and rinsing off  the CHG Soap.                9.  Pat yourself dry with  a clean towel.            10.  Wear clean pajamas.            11.  Place clean sheets on your bed the night of your first shower and do not  sleep with pets. Day of Surgery : Do not apply any lotions/deodorants the morning of surgery.  Please wear clean clothes to the hospital/surgery center.  FAILURE TO FOLLOW THESE INSTRUCTIONS MAY RESULT IN THE CANCELLATION OF YOUR SURGERY PATIENT SIGNATURE_________________________________  NURSE SIGNATURE__________________________________  ________________________________________________________________________

## 2018-11-08 ENCOUNTER — Telehealth: Payer: Self-pay

## 2018-11-08 ENCOUNTER — Encounter (HOSPITAL_COMMUNITY): Payer: Self-pay

## 2018-11-08 ENCOUNTER — Other Ambulatory Visit: Payer: Self-pay

## 2018-11-08 ENCOUNTER — Encounter (HOSPITAL_COMMUNITY)
Admission: RE | Admit: 2018-11-08 | Discharge: 2018-11-08 | Disposition: A | Discharge status: Home / Self Care | Payer: BLUE CROSS/BLUE SHIELD | Source: Ambulatory Visit | Attending: Obstetrics and Gynecology | Admitting: Obstetrics and Gynecology

## 2018-11-08 DIAGNOSIS — N840 Polyp of corpus uteri: Secondary | ICD-10-CM | POA: Diagnosis not present

## 2018-11-08 DIAGNOSIS — D649 Anemia, unspecified: Secondary | ICD-10-CM | POA: Diagnosis not present

## 2018-11-08 DIAGNOSIS — N920 Excessive and frequent menstruation with regular cycle: Secondary | ICD-10-CM | POA: Insufficient documentation

## 2018-11-08 DIAGNOSIS — Z01818 Encounter for other preprocedural examination: Secondary | ICD-10-CM | POA: Diagnosis not present

## 2018-11-08 DIAGNOSIS — Z1159 Encounter for screening for other viral diseases: Secondary | ICD-10-CM | POA: Diagnosis not present

## 2018-11-08 DIAGNOSIS — R079 Chest pain, unspecified: Secondary | ICD-10-CM | POA: Insufficient documentation

## 2018-11-08 HISTORY — DX: Chest pain, unspecified: R07.9

## 2018-11-08 LAB — BASIC METABOLIC PANEL
Anion gap: 11 (ref 5–15)
BUN: 17 mg/dL (ref 6–20)
CO2: 22 mmol/L (ref 22–32)
Calcium: 10.2 mg/dL (ref 8.9–10.3)
Chloride: 104 mmol/L (ref 98–111)
Creatinine, Ser: 0.69 mg/dL (ref 0.44–1.00)
GFR calc Af Amer: >60 mL/min (ref >60)
GFR calc non Af Amer: >60 mL/min (ref >60)
Glucose, Bld: 104 mg/dL — ABNORMAL HIGH (ref 70–99)
Potassium: 4.4 mmol/L (ref 3.5–5.1)
Sodium: 137 mmol/L (ref 135–145)

## 2018-11-08 LAB — CBC
HCT: 35.2 % — ABNORMAL LOW (ref 36.0–46.0)
Hemoglobin: 11 g/dL — ABNORMAL LOW (ref 12.0–15.0)
MCH: 24.7 pg — ABNORMAL LOW (ref 26.0–34.0)
MCHC: 31.3 g/dL (ref 30.0–36.0)
MCV: 79.1 fL — ABNORMAL LOW (ref 80.0–100.0)
Platelets: 296 10*3/uL (ref 150–400)
RBC: 4.45 MIL/uL (ref 3.87–5.11)
RDW: 14.9 % (ref 11.5–15.5)
WBC: 12.8 10*3/uL — ABNORMAL HIGH (ref 4.0–10.5)
nRBC: 0 % (ref 0.0–0.2)

## 2018-11-08 LAB — ABO/RH: ABO/RH(D): A POS

## 2018-11-08 LAB — GLUCOSE, CAPILLARY: Glucose-Capillary: 77 mg/dL (ref 70–99)

## 2018-11-08 NOTE — Telephone Encounter (Signed)
Called interpreter line [phone] and spoke with interpreter [phone]. We called patient but she was unavailable. Message was left to return call to Triage nurse tomorrow--important.

## 2018-11-08 NOTE — Telephone Encounter (Signed)
-----   Message from Nunzio Cobbs, MD sent at 11/08/2018  4:22 PM EDT ----- Please contact patient regarding her preop blood work.  Her WBC is slightly elevated.  Is she having any respiratory symptoms or not feeling well in any way? Her hemoglobin is 11, which is great for her! Blood sugar is 104.  Blood type is A+.

## 2018-11-09 ENCOUNTER — Other Ambulatory Visit (HOSPITAL_COMMUNITY)
Admission: RE | Admit: 2018-11-09 | Discharge: 2018-11-09 | Disposition: A | Discharge status: Home / Self Care | Payer: BLUE CROSS/BLUE SHIELD | Source: Ambulatory Visit | Attending: Obstetrics and Gynecology | Admitting: Obstetrics and Gynecology

## 2018-11-09 DIAGNOSIS — Z1159 Encounter for screening for other viral diseases: Secondary | ICD-10-CM | POA: Insufficient documentation

## 2018-11-09 NOTE — Telephone Encounter (Signed)
Spoke with patients son, [name], ok per dpr. Advised as seen below per Dr. Quincy Simmonds. Patient has been easily fatigued with activity and dizziness with activity for the past couple of weeks. Denies respiratory symptoms, headache, cough, fever/chills, N/V, SOB, lightheadedness. No vaginal bleeding at this time. States they are currently in line for Covid-19 testing. Advised to proceed with Covid-19 testing, will update Dr. Quincy Simmonds and return call with any additional recommendations. Son verbalizes understanding and is agreeable.   Routing to Dr. Quincy Simmonds to review.

## 2018-11-09 NOTE — Anesthesia Preprocedure Evaluation (Addendum)
Anesthesia Evaluation  Patient identified by MRN, date of birth, ID band Patient awake    Reviewed: Allergy & Precautions, NPO status , Patient's Chart, lab work & pertinent test results  Airway Mallampati: II  TM Distance: >3 FB Neck ROM: Full    Dental  (+) Dental Advisory Given   Pulmonary neg pulmonary ROS,    Pulmonary exam normal breath sounds clear to auscultation       Cardiovascular negative cardio ROS Normal cardiovascular exam Rhythm:Regular Rate:Normal     Neuro/Psych negative neurological ROS  negative psych ROS   GI/Hepatic negative GI ROS, Neg liver ROS,   Endo/Other  diabetes  Renal/GU negative Renal ROS     Musculoskeletal negative musculoskeletal ROS (+)   Abdominal   Peds  Hematology negative hematology ROS (+) anemia ,   Anesthesia Other Findings Day of surgery medications reviewed with the patient.  Reproductive/Obstetrics negative OB ROS                                                             Anesthesia Evaluation  Patient identified by MRN, date of birth, ID band Patient awake    Reviewed: Allergy & Precautions, H&P , NPO status , Patient's Chart, lab work & pertinent test results  Airway Mallampati: III  TM Distance: >3 FB Neck ROM: Full    Dental no notable dental hx. (+) Teeth Intact, Dental Advisory Given   Pulmonary neg pulmonary ROS,    Pulmonary exam normal breath sounds clear to auscultation- rhonchi       Cardiovascular hypertension, Pt. on medications Normal cardiovascular exam Rhythm:Regular Rate:Normal     Neuro/Psych negative neurological ROS  negative psych ROS   GI/Hepatic negative GI ROS, Neg liver ROS,   Endo/Other  diabetes, Poorly Controlled, Type 2, Oral Hypoglycemic AgentsObesity Hyperlipidemia  Renal/GU negative Renal ROS  negative genitourinary   Musculoskeletal negative musculoskeletal ROS (+)    Abdominal (+) + obese,   Peds  Hematology  (+) anemia ,   Anesthesia Other Findings   Reproductive/Obstetrics menorrhagia                             Anesthesia Physical  Anesthesia Plan  ASA: III  Anesthesia Plan: General   Post-op Pain Management:    Induction: Intravenous  Airway Management Planned: LMA  Additional Equipment:   Intra-op Plan:   Post-operative Plan: Extubation in OR  Informed Consent: I have reviewed the patients History and Physical, chart, labs and discussed the procedure including the risks, benefits and alternatives for the proposed anesthesia with the patient or authorized representative who has indicated his/her understanding and acceptance.   Dental advisory given  Plan Discussed with: CRNA, Anesthesiologist and Surgeon  Anesthesia Plan Comments:         Anesthesia Quick Evaluation                                    Anesthesia Evaluation  Patient identified by MRN, date of birth, ID band Patient awake    Reviewed: Allergy & Precautions, H&P , NPO status , Patient's Chart, lab work & pertinent test results  Airway Mallampati: III TM Distance: >3 FB Neck  ROM: Full    Dental no notable dental hx. (+) Teeth Intact   Pulmonary neg pulmonary ROS,  breath sounds clear to auscultation- rhonchi  Pulmonary exam normal       Cardiovascular hypertension, Pt. on medications Rhythm:Regular Rate:Normal     Neuro/Psych negative neurological ROS  negative psych ROS   GI/Hepatic negative GI ROS, Neg liver ROS,   Endo/Other  diabetes, Poorly Controlled, Type 2, Oral Hypoglycemic AgentsObesity Hyperlipidemia  Renal/GU negative Renal ROS  negative genitourinary   Musculoskeletal negative musculoskeletal ROS (+)   Abdominal (+) + obese,   Peds  Hematology  (+) anemia ,   Anesthesia Other Findings   Reproductive/Obstetrics menorrhagia                           Anesthesia Physical Anesthesia Plan  ASA: III  Anesthesia Plan: General   Post-op Pain Management:    Induction: Intravenous  Airway Management Planned: LMA  Additional Equipment:   Intra-op Plan:   Post-operative Plan: Extubation in OR  Informed Consent: I have reviewed the patients History and Physical, chart, labs and discussed the procedure including the risks, benefits and alternatives for the proposed anesthesia with the patient or authorized representative who has indicated his/her understanding and acceptance.   Dental advisory given  Plan Discussed with: CRNA, Anesthesiologist and Surgeon  Anesthesia Plan Comments:         Anesthesia Quick Evaluation   Anesthesia Physical Anesthesia Plan  ASA: II  Anesthesia Plan: General   Post-op Pain Management:    Induction: Intravenous  PONV Risk Score and Plan: 4 or greater and Ondansetron, Dexamethasone, Midazolam, Treatment may vary due to age or medical condition and Scopolamine patch - Pre-op  Airway Management Planned: LMA  Additional Equipment: None  Intra-op Plan:   Post-operative Plan: Extubation in OR  Informed Consent: I have reviewed the patients History and Physical, chart, labs and discussed the procedure including the risks, benefits and alternatives for the proposed anesthesia with the patient or authorized representative who has indicated his/her understanding and acceptance.     Dental advisory given  Plan Discussed with: CRNA  Anesthesia Plan Comments: (Asymptomatic at PAT visit 11/08/18, EKG NSR, Shelley Felix, PA-C)      Anesthesia Quick Evaluation

## 2018-11-09 NOTE — Telephone Encounter (Signed)
No additional recommendations.  Will await Covid 19 test results.

## 2018-11-09 NOTE — Progress Notes (Signed)
SPOKE W/  _husband  Patient does not speak english     SCREENING SYMPTOMS OF COVID 19:   COUGH--no  RUNNY NOSE---no   SORE THROAT---no  NASAL CONGESTION----no  SNEEZING----no  SHORTNESS OF BREATH---no  DIFFICULTY BREATHING---no  TEMP >100.0 -----no  UNEXPLAINED BODY ACHES------no  CHILLS -------- no  HEADACHES ---------no  LOSS OF SMELL/ TASTE --------no    HAVE YOU OR ANY FAMILY MEMBER TRAVELLED PAST 14 DAYS OUT OF THE   COUNTY---no STATE----no COUNTRY----no  HAVE YOU OR ANY FAMILY MEMBER BEEN EXPOSED TO ANYONE WITH COVID 19? no

## 2018-11-09 NOTE — Telephone Encounter (Signed)
Patient's son [name] returning a call to nurse.

## 2018-11-09 NOTE — Telephone Encounter (Signed)
Encounter closed

## 2018-11-10 LAB — NOVEL CORONAVIRUS, NAA (HOSP ORDER, SEND-OUT TO REF LAB; TAT 18-24 HRS): SARS-CoV-2, NAA: NOT DETECTED

## 2018-11-13 ENCOUNTER — Observation Stay (HOSPITAL_BASED_OUTPATIENT_CLINIC_OR_DEPARTMENT_OTHER)
Admission: RE | Admit: 2018-11-13 | Discharge: 2018-11-14 | Disposition: A | Discharge status: Home / Self Care | Payer: BLUE CROSS/BLUE SHIELD | Attending: Obstetrics and Gynecology | Admitting: Obstetrics and Gynecology

## 2018-11-13 ENCOUNTER — Encounter (HOSPITAL_BASED_OUTPATIENT_CLINIC_OR_DEPARTMENT_OTHER): Payer: Self-pay

## 2018-11-13 ENCOUNTER — Observation Stay (HOSPITAL_BASED_OUTPATIENT_CLINIC_OR_DEPARTMENT_OTHER): Payer: BLUE CROSS/BLUE SHIELD | Admitting: Anesthesiology

## 2018-11-13 ENCOUNTER — Observation Stay (HOSPITAL_BASED_OUTPATIENT_CLINIC_OR_DEPARTMENT_OTHER): Payer: BLUE CROSS/BLUE SHIELD | Admitting: Physician Assistant

## 2018-11-13 ENCOUNTER — Encounter (HOSPITAL_BASED_OUTPATIENT_CLINIC_OR_DEPARTMENT_OTHER)
Admission: RE | Disposition: A | Discharge status: Home / Self Care | Payer: Self-pay | Source: Home / Self Care | Attending: Obstetrics and Gynecology

## 2018-11-13 DIAGNOSIS — D251 Intramural leiomyoma of uterus: Secondary | ICD-10-CM

## 2018-11-13 DIAGNOSIS — E785 Hyperlipidemia, unspecified: Secondary | ICD-10-CM | POA: Diagnosis not present

## 2018-11-13 DIAGNOSIS — E669 Obesity, unspecified: Secondary | ICD-10-CM | POA: Insufficient documentation

## 2018-11-13 DIAGNOSIS — D259 Leiomyoma of uterus, unspecified: Secondary | ICD-10-CM | POA: Diagnosis present

## 2018-11-13 DIAGNOSIS — N838 Other noninflammatory disorders of ovary, fallopian tube and broad ligament: Secondary | ICD-10-CM | POA: Insufficient documentation

## 2018-11-13 DIAGNOSIS — N840 Polyp of corpus uteri: Secondary | ICD-10-CM

## 2018-11-13 DIAGNOSIS — Z6828 Body mass index (BMI) 28.0-28.9, adult: Secondary | ICD-10-CM | POA: Insufficient documentation

## 2018-11-13 DIAGNOSIS — N920 Excessive and frequent menstruation with regular cycle: Principal | ICD-10-CM | POA: Insufficient documentation

## 2018-11-13 DIAGNOSIS — D72829 Elevated white blood cell count, unspecified: Secondary | ICD-10-CM | POA: Diagnosis not present

## 2018-11-13 DIAGNOSIS — N800 Endometriosis of uterus: Secondary | ICD-10-CM

## 2018-11-13 DIAGNOSIS — L7632 Postprocedural hematoma of skin and subcutaneous tissue following other procedure: Secondary | ICD-10-CM | POA: Diagnosis not present

## 2018-11-13 DIAGNOSIS — Z794 Long term (current) use of insulin: Secondary | ICD-10-CM | POA: Insufficient documentation

## 2018-11-13 DIAGNOSIS — Z90710 Acquired absence of both cervix and uterus: Secondary | ICD-10-CM | POA: Diagnosis present

## 2018-11-13 DIAGNOSIS — E119 Type 2 diabetes mellitus without complications: Secondary | ICD-10-CM | POA: Diagnosis not present

## 2018-11-13 DIAGNOSIS — D508 Other iron deficiency anemias: Secondary | ICD-10-CM | POA: Insufficient documentation

## 2018-11-13 DIAGNOSIS — Z79899 Other long term (current) drug therapy: Secondary | ICD-10-CM | POA: Diagnosis not present

## 2018-11-13 DIAGNOSIS — Z7982 Long term (current) use of aspirin: Secondary | ICD-10-CM | POA: Diagnosis not present

## 2018-11-13 DIAGNOSIS — N946 Dysmenorrhea, unspecified: Secondary | ICD-10-CM | POA: Insufficient documentation

## 2018-11-13 HISTORY — PX: TOTAL LAPAROSCOPIC HYSTERECTOMY WITH SALPINGECTOMY: SHX6742

## 2018-11-13 HISTORY — PX: CYSTOSCOPY: SHX5120

## 2018-11-13 LAB — POCT PREGNANCY, URINE: Preg Test, Ur: NEGATIVE

## 2018-11-13 LAB — CBC
HCT: 31.6 % — ABNORMAL LOW (ref 36.0–46.0)
Hemoglobin: 9.5 g/dL — ABNORMAL LOW (ref 12.0–15.0)
MCH: 23.9 pg — ABNORMAL LOW (ref 26.0–34.0)
MCHC: 30.1 g/dL (ref 30.0–36.0)
MCV: 79.4 fL — ABNORMAL LOW (ref 80.0–100.0)
Platelets: 236 10*3/uL (ref 150–400)
RBC: 3.98 MIL/uL (ref 3.87–5.11)
RDW: 14.7 % (ref 11.5–15.5)
WBC: 15.3 10*3/uL — ABNORMAL HIGH (ref 4.0–10.5)
nRBC: 0 % (ref 0.0–0.2)

## 2018-11-13 LAB — GLUCOSE, CAPILLARY
Glucose-Capillary: 135 mg/dL — ABNORMAL HIGH (ref 70–99)
Glucose-Capillary: 172 mg/dL — ABNORMAL HIGH (ref 70–99)

## 2018-11-13 LAB — BASIC METABOLIC PANEL
Anion gap: 8 (ref 5–15)
BUN: 22 mg/dL — ABNORMAL HIGH (ref 6–20)
CO2: 23 mmol/L (ref 22–32)
Calcium: 8.9 mg/dL (ref 8.9–10.3)
Chloride: 105 mmol/L (ref 98–111)
Creatinine, Ser: 0.86 mg/dL (ref 0.44–1.00)
GFR calc Af Amer: >60 mL/min (ref >60)
GFR calc non Af Amer: >60 mL/min (ref >60)
Glucose, Bld: 179 mg/dL — ABNORMAL HIGH (ref 70–99)
Potassium: 4.5 mmol/L (ref 3.5–5.1)
Sodium: 136 mmol/L (ref 135–145)

## 2018-11-13 LAB — TYPE AND SCREEN
ABO/RH(D): A POS
Antibody Screen: NEGATIVE

## 2018-11-13 SURGERY — HYSTERECTOMY, TOTAL, LAPAROSCOPIC, WITH SALPINGECTOMY
Anesthesia: General | Laterality: Bilateral

## 2018-11-13 MED ORDER — SUGAMMADEX SODIUM 200 MG/2ML IV SOLN
INTRAVENOUS | Status: DC | PRN
  Administered 2018-11-13: 100 mg via INTRAVENOUS

## 2018-11-13 MED ORDER — DEXAMETHASONE SODIUM PHOSPHATE 10 MG/ML IJ SOLN
INTRAMUSCULAR | Status: AC
  Filled 2018-11-13: qty 1

## 2018-11-13 MED ORDER — KETOROLAC TROMETHAMINE 30 MG/ML IJ SOLN
INTRAMUSCULAR | Status: AC
  Filled 2018-11-13: qty 1

## 2018-11-13 MED ORDER — MIDAZOLAM HCL 5 MG/5ML IJ SOLN
INTRAMUSCULAR | Status: DC | PRN
  Administered 2018-11-13: 2 mg via INTRAVENOUS

## 2018-11-13 MED ORDER — BUPIVACAINE HCL (PF) 0.25 % IJ SOLN
INTRAMUSCULAR | Status: DC | PRN
  Administered 2018-11-13: 8 mL

## 2018-11-13 MED ORDER — ONDANSETRON HCL 4 MG/2ML IJ SOLN
4.0000 mg | Freq: Four times a day (QID) | INTRAMUSCULAR | Status: DC | PRN
  Filled 2018-11-13: qty 2

## 2018-11-13 MED ORDER — MORPHINE SULFATE (PF) 2 MG/ML IV SOLN
1.0000 mg | INTRAVENOUS | Status: DC | PRN
  Administered 2018-11-13 (×2): 1 mg via INTRAVENOUS
  Filled 2018-11-13: qty 1

## 2018-11-13 MED ORDER — FENTANYL CITRATE (PF) 100 MCG/2ML IJ SOLN
INTRAMUSCULAR | Status: DC | PRN
  Administered 2018-11-13: 50 ug via INTRAVENOUS
  Administered 2018-11-13 (×4): 25 ug via INTRAVENOUS
  Administered 2018-11-13: 50 ug via INTRAVENOUS

## 2018-11-13 MED ORDER — KETOROLAC TROMETHAMINE 30 MG/ML IJ SOLN
INTRAMUSCULAR | Status: DC | PRN
  Administered 2018-11-13: 30 mg via INTRAVENOUS

## 2018-11-13 MED ORDER — ROCURONIUM BROMIDE 10 MG/ML (PF) SYRINGE
PREFILLED_SYRINGE | INTRAVENOUS | Status: AC
  Filled 2018-11-13: qty 10

## 2018-11-13 MED ORDER — OXYCODONE-ACETAMINOPHEN 5-325 MG PO TABS
ORAL_TABLET | ORAL | Status: AC
  Filled 2018-11-13: qty 1

## 2018-11-13 MED ORDER — SODIUM CHLORIDE 0.9 % IV SOLN
INTRAVENOUS | Status: DC | PRN
  Administered 2018-11-13: 11:00:00

## 2018-11-13 MED ORDER — LACTATED RINGERS IV SOLN
INTRAVENOUS | Status: DC
  Administered 2018-11-13 (×2): via INTRAVENOUS
  Filled 2018-11-13: qty 1000

## 2018-11-13 MED ORDER — FENTANYL CITRATE (PF) 100 MCG/2ML IJ SOLN
INTRAMUSCULAR | Status: AC
  Filled 2018-11-13: qty 2

## 2018-11-13 MED ORDER — LIDOCAINE 2% (20 MG/ML) 5 ML SYRINGE
INTRAMUSCULAR | Status: DC | PRN
  Administered 2018-11-13: 100 mg via INTRAVENOUS

## 2018-11-13 MED ORDER — PROPOFOL 10 MG/ML IV BOLUS
INTRAVENOUS | Status: DC | PRN
  Administered 2018-11-13: 200 mg via INTRAVENOUS

## 2018-11-13 MED ORDER — ENOXAPARIN SODIUM 40 MG/0.4ML ~~LOC~~ SOLN
40.0000 mg | SUBCUTANEOUS | Status: AC
  Administered 2018-11-13: 40 mg via SUBCUTANEOUS
  Filled 2018-11-13: qty 0.4

## 2018-11-13 MED ORDER — SUGAMMADEX SODIUM 200 MG/2ML IV SOLN
INTRAVENOUS | Status: AC
  Filled 2018-11-13: qty 2

## 2018-11-13 MED ORDER — MIDAZOLAM HCL 2 MG/2ML IJ SOLN
INTRAMUSCULAR | Status: AC
  Filled 2018-11-13: qty 2

## 2018-11-13 MED ORDER — KETOROLAC TROMETHAMINE 30 MG/ML IJ SOLN
30.0000 mg | Freq: Four times a day (QID) | INTRAMUSCULAR | Status: DC
  Administered 2018-11-13 – 2018-11-14 (×3): 30 mg via INTRAVENOUS
  Filled 2018-11-13: qty 1

## 2018-11-13 MED ORDER — LIDOCAINE 2% (20 MG/ML) 5 ML SYRINGE
INTRAMUSCULAR | Status: AC
  Filled 2018-11-13: qty 5

## 2018-11-13 MED ORDER — SODIUM CHLORIDE 0.9 % IV SOLN
2.0000 g | INTRAVENOUS | Status: AC
  Administered 2018-11-13: 2 g via INTRAVENOUS
  Filled 2018-11-13: qty 2

## 2018-11-13 MED ORDER — MORPHINE SULFATE (PF) 2 MG/ML IV SOLN
INTRAVENOUS | Status: AC
  Filled 2018-11-13: qty 1

## 2018-11-13 MED ORDER — DEXAMETHASONE SODIUM PHOSPHATE 10 MG/ML IJ SOLN
INTRAMUSCULAR | Status: DC | PRN
  Administered 2018-11-13: 5 mg via INTRAVENOUS

## 2018-11-13 MED ORDER — ONDANSETRON HCL 4 MG/2ML IJ SOLN
INTRAMUSCULAR | Status: AC
  Filled 2018-11-13: qty 4

## 2018-11-13 MED ORDER — PHENYLEPHRINE 40 MCG/ML (10ML) SYRINGE FOR IV PUSH (FOR BLOOD PRESSURE SUPPORT)
PREFILLED_SYRINGE | INTRAVENOUS | Status: AC
  Filled 2018-11-13: qty 10

## 2018-11-13 MED ORDER — ONDANSETRON HCL 4 MG/2ML IJ SOLN
INTRAMUSCULAR | Status: DC | PRN
  Administered 2018-11-13 (×2): 4 mg via INTRAVENOUS

## 2018-11-13 MED ORDER — PHENYLEPHRINE 40 MCG/ML (10ML) SYRINGE FOR IV PUSH (FOR BLOOD PRESSURE SUPPORT)
PREFILLED_SYRINGE | INTRAVENOUS | Status: DC | PRN
  Administered 2018-11-13 (×2): 40 ug via INTRAVENOUS

## 2018-11-13 MED ORDER — LACTATED RINGERS IV SOLN
INTRAVENOUS | Status: DC
  Administered 2018-11-13 (×2): via INTRAVENOUS
  Filled 2018-11-13 (×3): qty 1000

## 2018-11-13 MED ORDER — MENTHOL 3 MG MT LOZG
1.0000 | LOZENGE | OROMUCOSAL | Status: DC | PRN
  Filled 2018-11-13: qty 9

## 2018-11-13 MED ORDER — OXYCODONE-ACETAMINOPHEN 5-325 MG PO TABS
1.0000 | ORAL_TABLET | ORAL | Status: DC | PRN
  Administered 2018-11-13 – 2018-11-14 (×4): 1 via ORAL
  Filled 2018-11-13: qty 2

## 2018-11-13 MED ORDER — ONDANSETRON HCL 4 MG PO TABS
4.0000 mg | ORAL_TABLET | Freq: Four times a day (QID) | ORAL | Status: DC | PRN
  Filled 2018-11-13: qty 1

## 2018-11-13 MED ORDER — ROCURONIUM BROMIDE 100 MG/10ML IV SOLN
INTRAVENOUS | Status: DC | PRN
  Administered 2018-11-13: 50 mg via INTRAVENOUS
  Administered 2018-11-13: 20 mg via INTRAVENOUS

## 2018-11-13 MED ORDER — ENOXAPARIN SODIUM 40 MG/0.4ML ~~LOC~~ SOLN
SUBCUTANEOUS | Status: AC
  Filled 2018-11-13: qty 0.4

## 2018-11-13 MED ORDER — SODIUM CHLORIDE 0.9 % IV SOLN
INTRAVENOUS | Status: AC
  Filled 2018-11-13: qty 2

## 2018-11-13 SURGICAL SUPPLY — 60 items
APPLICATOR ARISTA FLEXITIP XL (MISCELLANEOUS) ×1 IMPLANT
BARRIER ADHS 3X4 INTERCEED (GAUZE/BANDAGES/DRESSINGS) IMPLANT
BLADE SURG 10 STRL SS (BLADE) ×1 IMPLANT
CABLE HIGH FREQUENCY MONO STRZ (ELECTRODE) IMPLANT
CANISTER SUCT 3000ML PPV (MISCELLANEOUS) ×3 IMPLANT
CELL SAVER LIPIGURD (MISCELLANEOUS) IMPLANT
COVER MAYO STAND STRL (DRAPES) ×3 IMPLANT
COVER TABLE BACK 60X90 (DRAPES) IMPLANT
COVER WAND RF STERILE (DRAPES) ×3 IMPLANT
DECANTER SPIKE VIAL GLASS SM (MISCELLANEOUS) ×6 IMPLANT
DERMABOND ADVANCED (GAUZE/BANDAGES/DRESSINGS) ×1
DERMABOND ADVANCED .7 DNX12 (GAUZE/BANDAGES/DRESSINGS) ×2 IMPLANT
DEVICE RETRIEVAL ALEXIS 14 (MISCELLANEOUS) IMPLANT
DURAPREP 26ML APPLICATOR (WOUND CARE) ×3 IMPLANT
EXTRT SYSTEM ALEXIS 14CM (MISCELLANEOUS)
EXTRT SYSTEM ALEXIS 17CM (MISCELLANEOUS)
GAUZE 4X4 16PLY RFD (DISPOSABLE) ×3 IMPLANT
GLOVE BIO SURGEON STRL SZ 6.5 (GLOVE) ×6 IMPLANT
GLOVE BIOGEL PI IND STRL 7.0 (GLOVE) ×6 IMPLANT
GLOVE BIOGEL PI INDICATOR 7.0 (GLOVE) ×3
GOWN STRL REUS W/TWL LRG LVL3 (GOWN DISPOSABLE) ×12 IMPLANT
HEMOSTAT ARISTA ABSORB 3G PWDR (HEMOSTASIS) ×1 IMPLANT
HOLDER FOLEY CATH W/STRAP (MISCELLANEOUS) IMPLANT
LIGASURE VESSEL 5MM BLUNT TIP (ELECTROSURGICAL) ×3 IMPLANT
NEEDLE INSUFFLATION 120MM (ENDOMECHANICALS) ×3 IMPLANT
OCCLUDER COLPOPNEUMO (BALLOONS) ×3 IMPLANT
PACK LAPAROSCOPY BASIN (CUSTOM PROCEDURE TRAY) ×3 IMPLANT
PACK TRENDGUARD 450 HYBRID PRO (MISCELLANEOUS) IMPLANT
POUCH LAPAROSCOPIC INSTRUMENT (MISCELLANEOUS) ×3 IMPLANT
PROTECTOR NERVE ULNAR (MISCELLANEOUS) ×6 IMPLANT
RETRACTOR WOUND ALXS 19CM XSML (INSTRUMENTS) IMPLANT
RTRCTR WOUND ALEXIS 19CM XSML (INSTRUMENTS)
SCISSORS LAP 5X35 DISP (ENDOMECHANICALS) IMPLANT
SET IRRIG TUBING LAPAROSCOPIC (IRRIGATION / IRRIGATOR) ×3 IMPLANT
SET IRRIG Y TYPE TUR BLADDER L (SET/KITS/TRAYS/PACK) ×3 IMPLANT
SET TRI-LUMEN FLTR TB AIRSEAL (TUBING) ×3 IMPLANT
SHEARS HARMONIC ACE PLUS 36CM (ENDOMECHANICALS) ×3 IMPLANT
SLEEVE ADV FIXATION 5X100MM (TROCAR) ×3 IMPLANT
SUT VIC AB 0 CT1 27 (SUTURE) ×3
SUT VIC AB 0 CT1 27XBRD ANBCTR (SUTURE) ×4 IMPLANT
SUT VICRYL 0 UR6 27IN ABS (SUTURE) ×1 IMPLANT
SUT VICRYL 4-0 PS2 18IN ABS (SUTURE) ×3 IMPLANT
SUT VLOC 180 0 9IN  GS21 (SUTURE)
SUT VLOC 180 0 9IN GS21 (SUTURE) IMPLANT
SYR 10ML LL (SYRINGE) ×3 IMPLANT
SYR 50ML LL SCALE MARK (SYRINGE) ×6 IMPLANT
SYSTEM CARTER THOMASON II (TROCAR) ×1 IMPLANT
SYSTEM CONTND EXTRCTN KII BLLN (MISCELLANEOUS) IMPLANT
TIP RUMI ORANGE 6.7MMX12CM (TIP) IMPLANT
TIP UTERINE 5.1X6CM LAV DISP (MISCELLANEOUS) IMPLANT
TIP UTERINE 6.7X10CM GRN DISP (MISCELLANEOUS) IMPLANT
TIP UTERINE 6.7X6CM WHT DISP (MISCELLANEOUS) IMPLANT
TIP UTERINE 6.7X8CM BLUE DISP (MISCELLANEOUS) ×1 IMPLANT
TOWEL OR 17X26 10 PK STRL BLUE (TOWEL DISPOSABLE) ×6 IMPLANT
TRAY FOLEY W/BAG SLVR 14FR (SET/KITS/TRAYS/PACK) ×3 IMPLANT
TRENDGUARD 450 HYBRID PRO PACK (MISCELLANEOUS)
TROCAR ADV FIXATION 5X100MM (TROCAR) ×3 IMPLANT
TROCAR PORT AIRSEAL 8X120 (TROCAR) ×3 IMPLANT
TROCAR XCEL NON-BLD 5MMX100MML (ENDOMECHANICALS) ×3 IMPLANT
WARMER LAPAROSCOPE (MISCELLANEOUS) ×3 IMPLANT

## 2018-11-13 NOTE — Transfer of Care (Signed)
Immediate Anesthesia Transfer of Care Note  Patient: Shelley Escobar  Procedure(s) Performed: TOTAL LAPAROSCOPIC HYSTERECTOMY WITH SALPINGECTOMY  REPAIR VAGIAL WALL LACERATION (Bilateral ) CYSTOSCOPY  Patient Location: PACU  Anesthesia Type:General  Level of Consciousness: awake and alert   Airway & Oxygen Therapy: Patient Spontanous Breathing and Patient connected to nasal cannula oxygen  Post-op Assessment: Report given to RN and Post -op Vital signs reviewed and stable  Post vital signs: Reviewed and stable  Last Vitals:  Vitals Value Taken Time  BP 137/77 11/13/2018 11:58 AM  Temp    Pulse 86 11/13/2018 12:00 PM  Resp 18 11/13/2018 12:00 PM  SpO2 99 % 11/13/2018 12:00 PM  Vitals shown include unvalidated device data.  Last Pain:  Vitals:   11/13/18 0659  TempSrc: Oral  PainSc: 0-No pain      Patients Stated Pain Goal: 5 (16/10/96 0454)  Complications: No apparent anesthesia complications

## 2018-11-13 NOTE — Op Note (Addendum)
OPERATIVE REPORT  PREOPERATIVE DIAGNOSIS:   Menorrhagia, uterine fibroid, endometrial polyp, suspected adenomyosis, anemia.  POSTOPERATIVE DIAGNOSIS:  Menorrhagia, uterine fibroid, endometrial polyp, suspected adenomyosis, anemia, superficial left vaginal wall laceration.   PROCEDURES:  Total laparoscopic hysterectomy with bilateral salpingectomy, cystoscopy, repair of left vaginal wall laceration.   SURGEON:  Lenard Galloway, M.D.  ASSISTANT:   Dorothy Spark, M.D.  ANESTHESIA:  General endotracheal, intraperitoneal ropivicaine 30 mL diluted in 30 mL of normal saline, local with 0.25% Marcaine.  IVF:   1400 cc LR  ESTIMATED BLOOD LOSS:   50 cc  URINE OUTPUT:   161 cc  COMPLICATIONS:  Small left vaginal side wall laceration.   INDICATIONS FOR THE PROCEDURE:     The patient is a 48 year old Para 19 Asian female status post bilateral tubal ligation who presents with menorrhagia and anemia.  Pelvic ultrasound documents a small uterine fibroid and suspected adenomyosis.  Endometrial biopsy showed a benign polyp.  The patient declines medical therapy and future childbearing.   She has dysmenorrhea and has had a normal pelvic ultrasound.  She declines further medical therapy and future childbearing, and is requesting hysterectomy procedure.   A plan is made to proceed with a total laparoscopic hysterectomy with bilateral salpingectomy, possible bilateral oophorectomy, and cystoscopy after risks, benefits, and alternatives are reviewed.  FINDINGS:     Laparoscopy revealed a slightly enlarged uterus, bilateral tubes consistent with tubal ligation, and normal  ovaries.  The upper abdomen was normal and demonstrated a normal liver.  No adhesive disease or endometriosis were seen in the abdomen or pelvis.  The appendix was absent.   Cystoscopy at the termination of the procedure showed the bladder to be normal throughout 360 degrees including the bladder dome and trigone. There was no evidence  of any foreign body in the bladder or the urethra. There was no evidence of any lesions of the bladder or the urethra.   Both of the ureters were noted to be patent bilaterally.  There was a 2 cm superficial left mid vaginal wall laceration at the end of the procedure which was repaired.  SPECIMENS:     The uterus, cervix, and bilateral tubes were went to pathology.   DESCRIPTION OF PROCEDURE:    The patient was reidentified in the preoperative hold area.   She did receive Cefotetan IV for antibiotic prophylaxis.  She received Lovenox and PAS stockings for DVT prophylaxis.  In the operating room, the patient was placed in the dorsal lithotomy position on the operating room table.  The Trendguard was used to support her on the OR table.  Her legs were placed in the [place] stirrups and her arms were both padded and tucked at her sides. The patient received general endotracheal anesthesia. The abdomen and vagina were then sterilely prepped and she was sterilely draped.  A speculum was placed in the vagina and a single-tooth tenaculum was placed on the anterior cervical lip.  A figure-of-eight suture of 0 Vicryl was placed on each the anterior and the posterior cervical lips. The uterus was sounded to _ 8___ cm.  The cervix was then dilated with Hegar dilators.  A #  __8____ RUMI tip with a small metal KOH ring was then placed through the cervix and into the uterine cavity without difficulty.  The remaining vaginal instruments were then removed.  A Foley catheter was placed inside the bladder.  Attention was turned to the abdomen where the umbilical region was injected with 0.25%  Marcaine and a small incision created.  A Veress needle was then used to insufflate the abdomen with CO2 gas after a saline drop test was performed and the fluid flowed freely.  A 5 mm umbilical incision was created with a scalpel after the skin. A 5 mm camera port was then placed using the Optiview.  5 mm incisions were then  created in the left upper abdomen and the left lower abdomen after the skin was injected locally with 0.25% Marcaine.  The 5 mm trocars were then placed under visualization of the laparoscope.  An 8 mm Air Seal trocar was placed in the right lower quadrant after injecting with Marcaine and incising with a scalpel.     Ropivicine was placed inside the peritoneal cavity.   The patient was placed in Trendelenburg position.  An inspection of the abdomen and pelvis was performed. The findings are as noted above.   The bilateral ureters were identified.  The left fallopian tube was grasped and the Ligasure was used to cauterize and cut through the mesosalpinx and then the proximal tube.  The specimen was sent to pathology.  The left utero-ovarian ligament was similarly cauterized and cut with the Ligasure.  The left round ligament was then cauterized and divided with same instrument.  Dissection was performed to the anterior and posterior leaves of the broad ligaments using the Harmonic scalpel.  The incision was carried across the anterior cul- de-sac along the vesicouterine fold and the bladder was dissected away from the cervix using the Harmonic scalpel. The peritoneum was taken down posteriorly.  The left uterine artery was skeletonized.   It was cauterized and cut with the Ligasure instrument after the right uterine artery was cauterized and cut.     Attention was turned to the patient's right-hand side at this time.  The same procedure that was performed on the left side was repeated on the right side with respect to the isolation, cautery, and transection of the vessels and the bladder flap dissection.   The right fallopian tube was removed in a similar fashion and extracted from the peritoneal cavity and sent to pathology.  The KOH ring was nicely visible.  The colpotomy incision was performed with the Harmonic scalpel in a circumferential fashion. The specimen was then removed from the peritoneal  cavity vaginally and did require a small mount of vaginal morcellation to accomplish this. The vagina was protected with retractors and a scalpel was used to morcellate under direct visualization.  The specimen was sent to Pathology.  The vaginal cuff was sutured at this time using a running suture of 0 V-Loc.  The vagina was closed from the patient's right hand side to the left hand side and then back 2 sutures towards the midline.  This provided good full-thickness closure of the vaginal cuff.  The laparoscopic needle for suturing was removed from the peritoneal cavity.  The pelvis was irrigated and suctioned.   The pneumoperitoneal was let down.  There was good hemostasis of the operative sites and pedicles.  Carleene Overlie was placed in the pelvis over the operating sites.   The 8 mm trocar was removed and the [employer] instrument was used to close the fascia with a 0/0 vicryl suture.   The remaining left sided laparoscopic trocars were removed after the CO2 pneumoperitoneum was released and the patient received manual breaths.    The patient's Foley catheter was removed at this time and cystoscopy was performed and the findings are as  noted above.  The Foley catheter was left out.   Final inspection of the vagina demonstrated good hemostasis of the vaginal cuff.  There was a superficial laceration of the left vaginal sidewall, and this was repaired with a running locked suture of 0/0 Vicryl.  All skin incisions were closed with subcuticular sutures of 4-0 Vicryl.  Dermabond was placed over the incisions.  This concluded the patient's procedure.  She was extubated and escorted to the recovery room in stable and awake condition.  All needle, instrument, and sponge counts were correct.

## 2018-11-13 NOTE — H&P (Signed)
Office Visit   11/01/2018 Shelley Escobar, Shelley All, MD  Obstetrics and Gynecology   Menorrhagia with regular cycle +1 more  Dx   Follow-up   ; Referred by Rutherford Guys, MD  Reason for Visit   Additional Documentation   Vitals:   BP 100/64   Pulse 68   Temp 97.9 F (36.6 C) (Oral)   Resp 16   Wt 75.3 kg   LMP 10/15/2018 (LMP Unknown)   BMI 28.94 kg/m   BSA 1.84 m     More Vitals   Flowsheets:   Anthropometrics,   MEWS Score,   Method of Visit     Encounter Info:   Billing Info,   History,   Allergies,   Detailed Report     Escobar Notes   Progress Notes by Nunzio Cobbs, MD at 11/01/2018 9:00 AM  Author: Nunzio Cobbs, MD Author Type: Physician Filed: 11/04/2018 7:42 PM  Note Status: Signed Cosign: Cosign Not Required Encounter Date: 11/01/2018  Editor: Nunzio Cobbs, MD (Physician)  Prior Versions: [place], Swartz, CMA (Certified Medical Assistant) at 11/01/2018 9:13 AM - Sign when Signing Visit    GYNECOLOGY  VISIT   HPI: 48 y.o.   Married  Asian  female   [phone] with Patient's last menstrual period was 10/15/2018 (exact date).   here for  Surgical consult.  Her son is here and interpreting today.   She has heavy menses,an endometrial polyp, and suspected adenomyosis. She is waiting to proceed with hysterectomy.  She declines future childbearing and wishes for a definitive surgical procedure.  Patient has heavy menses and using 2 pads at at time.  Her bleeding is heavy enough to stain the sheets.  Has stated in past she does not have pain.  Patient was started on Micronor on 10/18/18 visit, but did not take them.  She has not had any bleeding since her last menses.  She used an Mirena IUD in the past, and it was expelled.  She has chronic anemia managed by oncology.  She usually has Hgb in the 8 - 10 range. Taking iron twice daily, Fusion plus. She had an iron infusion  on 10/26/18 and has another planned for tomorrow. Hgb 9.2 on 10/23/18.   A1C 6.5 on 09/06/18.  Glucose is 52 this morning, and she feels like she is low.   Patient and son indicate that our office is not a preferred provider for her health insurance. The patient wishes to proceed with me performing surgery.   GYNECOLOGIC HISTORY: Patient's last menstrual period was 10/15/2018 (exact date). Contraception:  Tubal Menopausal hormone therapy:  none Last mammogram:  09-18-17 density b, birads 1:neg Last pap smear:   06-14-18 neg HPV HR neg, 06-07-16 neg                OB History    Gravida  3   Para  3   Term  3   Preterm      AB      Living  3     SAB      TAB      Ectopic      Multiple      Live Births  3                  Patient Active Problem List   Diagnosis Date Noted  . Menorrhagia with regular  cycle 10/18/2018  . Endometrial polyp 10/18/2018  . Iron deficiency anemia secondary to inadequate dietary iron intake 05/23/2018  . IDA (iron deficiency anemia) 05/23/2018  . Uncontrolled type 2 diabetes mellitus without complication, with long-term current use of insulin ([place]) 01/03/2018  . Fatty infiltration of liver 03/10/2016  . Anemia 03/10/2016  . Obesity (BMI 30.0-34.9) 01/27/2012  . Dyslipidemia 07/02/2011        Past Medical History:  Diagnosis Date  . Acute appendicitis 01/13/2016  . Anemia    hx  . DM (diabetes mellitus) ([place])   . Endometrial polyp    Not removed as of 09/07/18  . Hyperlipidemia   . Menorrhagia with regular cycle   . SVD (spontaneous vaginal delivery)    x 3  . Vitamin D deficiency          Past Surgical History:  Procedure Laterality Date  . HYSTEROSCOPY W/D&C N/A 08/07/2013   Procedure: DILATATION AND CURETTAGE /HYSTEROSCOPY;  Surgeon: Azalia Bilis, MD;  Location: [facility];  Service: Gynecology;  Laterality: N/A;  . INTRAUTERINE DEVICE (IUD) INSERTION  09/13/2013  . LAPAROSCOPIC APPENDECTOMY N/A  01/13/2016   Procedure: APPENDECTOMY LAPAROSCOPIC;  Surgeon: Armandina Gemma, MD;  Location: [facility];  Service: General;  Laterality: N/A;  . TUBAL LIGATION     Age 1          Current Outpatient Medications  Medication Sig Dispense Refill  . aspirin EC 81 MG tablet Take 81 mg by mouth 3 (three) times a week.    . Continuous Blood Gluc Receiver (FREESTYLE LIBRE READER) DEVI Inject 1 Device into the skin See admin instructions. 1 Device 0  . Continuous Blood Gluc Sensor (FREESTYLE LIBRE 14 DAY SENSOR) MISC APPLY AS DIRECTED EVERY 14 DAYS 6 each 1  . gemfibrozil (LOPID) 600 MG tablet TAKE 1 TABLET(600 MG) BY MOUTH TWICE DAILY BEFORE A MEAL 180 tablet 3  . Insulin Glargine (LANTUS SOLOSTAR) 100 UNIT/ML Solostar Pen ADMINISTER 50 UNITS UNDER THE SKIN DAILY (Patient taking differently: Inject 40 Units into the skin at bedtime. ADMINISTER 40 UNITS UNDER THE SKIN DAILY) 15 mL 1  . Insulin Pen Needle (B-D UF III MINI PEN NEEDLES) 31G X 5 MM MISC USE DAILY AS DIRECTED WITH INSULIN 100 each 3  . lisinopril (PRINIVIL,ZESTRIL) 2.5 MG tablet Take 1 tablet (2.5 mg total) by mouth daily. 90 tablet 1  . metFORMIN (GLUCOPHAGE) 1000 MG tablet TAKE 1 TABLET(1000 MG) BY MOUTH TWICE DAILY WITH A MEAL 180 tablet 3  . TRULICITY 9.56 OZ/3.0QM SOPN INJECT 0.75 MG INTO THE SKIN ONCE A WEEK (Patient taking differently: Inject 0.75 mg into the skin every Friday. ) 2 mL 3  . folic acid (FOLVITE) 1 MG tablet Take 2 tablets (2 mg total) by mouth daily. 60 tablet 11   No current facility-administered medications for this visit.      ALLERGIES: Patient has no known allergies.  Family History  Adopted: Yes  Family history unknown: Yes    Social History        Socioeconomic History  . Marital status: Married    Spouse name: Shelley Escobar  . Number of children: 3  . Years of education: none  . Highest education level: Not on file  Occupational History  . 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 Financial resource strain: Not on file  . Food insecurity:    Worry: Not on file  Inability: Not on file  . Transportation needs:    Medical: Not on file    Non-medical: Not on file  Tobacco Use  . Smoking status: Never Smoker  . Smokeless tobacco: Never Used  Substance and Sexual Activity  . Alcohol use: No    Alcohol/week: 0.0 standard drinks  . Drug use: No  . Sexual activity: Yes    Partners: Male    Birth control/protection: Surgical    Comment: Tubal  Lifestyle  . Physical activity:    Days per week: Not on file    Minutes per session: Not on file  . Stress: Not on file  Relationships  . Social connections:    Talks on phone: Not on file    Gets together: Not on file    Attends religious service: Not on file    Active member of club or organization: Not on file    Attends meetings of clubs or organizations: Not on file    Relationship status: Not on file  . Intimate partner violence:    Fear of current or ex partner: Not on file    Emotionally abused: Not on file    Physically abused: Not on file    Forced sexual activity: Not on file  Other Topics Concern  . 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      Married in 1983 and has 3 grown children, 2 boys and one girl.       Lives with husband and two boys. Daughter lives locally.   Smoke detectors in home? Yes    Guns in home? No    Consistent seatbelt use? Yes    Consistent dental brushing and flossing? Yes    Semi-Annual dental visits: No    Annual Eye exams: No         Review of Systems  Constitutional:       Glucose level on the lower side, dizziness  HENT: Negative.   Eyes: Negative.   Respiratory: Negative.   Cardiovascular: Negative.   Gastrointestinal: Negative.    Endocrine: Negative.   Genitourinary: Negative.   Musculoskeletal: Negative.   Skin: Negative.   Allergic/Immunologic: Negative.   Neurological: Negative.   Psychiatric/Behavioral: Negative.     PHYSICAL EXAMINATION:    BP 100/64   Pulse 68   Temp 97.9 F (36.6 C) (Oral)   Resp 16   Wt 166 lb (75.3 kg)   LMP 10/15/2018 (Exact Date)   BMI 28.94 kg/m     General appearance: alert, cooperative and appears stated age Head: Normocephalic, without obvious abnormality, atraumatic Neck: no adenopathy, supple, symmetrical, trachea midline and thyroid normal to inspection and palpation Lungs: clear to auscultation bilaterally Heart: regular rate and rhythm Abdomen: soft, non-tender, no masses,  no organomegaly Extremities: extremities normal, atraumatic, no cyanosis or edema Skin: Skin color, texture, turgor normal. No rashes or lesions Lymph nodes: Cervical, supraclavicular, and axillary nodes normal. No abnormal inguinal nodes palpated Neurologic: Grossly normal  Pelvic: Deferred.  Had recent exam.   Chaperone was present for exam.  ASSESSMENT  Menorrhagia with regular menses. Endometrial polyp.  Adenomyosis. DM. Status post appendectomy with nonruptured appendix.   PLAN  I discussed total laparoscopic hysterectomy with bilateral salpingectomy and possible bilateral oophorectomy is abnormal, cystoscopy.    I reviewed risks, benefits, and alternatives.  Risks include but are not limited to bleeding, infection, damage to surrounding  organs, pneumonia, reaction to anesthesia, DVT, PE, death, need for reoperation, hernia formation, vaginal cuff dehiscence, and need to convert to a traditional laparotomy incision to complete the procedure.    Surgical expectations and recovery discussed.  Patient wishes to proceed. She will receive Lovenox for DVT/PE prophylaxis.  She will complete her next iron transfusion.   Questions invited and answered.  Patient  declines referral to a provider who is in her insurance network.  An After Visit Summary was printed and given to the patient.  _25_____ minutes face to face time of which over 50% was spent in counseling.

## 2018-11-13 NOTE — Progress Notes (Signed)
Update to History and Physical  No marked change in status since office pre-op visit.   Patient examined.   OK to proceed with surgery. 

## 2018-11-13 NOTE — Anesthesia Procedure Notes (Addendum)
Procedure Name: Intubation Date/Time: 11/13/2018 9:24 AM Performed by: Gwyndolyn Saxon, CRNA Pre-anesthesia Checklist: Patient identified, Emergency Drugs available, Suction available and Patient being monitored Patient Re-evaluated:Patient Re-evaluated prior to induction Oxygen Delivery Method: Circle system utilized Preoxygenation: Pre-oxygenation with 100% oxygen Induction Type: IV induction Ventilation: Mask ventilation without difficulty Laryngoscope Size: Mac and 3 Grade View: Grade I Tube type: Oral Tube size: 7.0 mm Number of attempts: 1 Airway Equipment and Method: Stylet Placement Confirmation: ETT inserted through vocal cords under direct vision,  positive ETCO2 and breath sounds checked- equal and bilateral Secured at: 21 cm Tube secured with: Tape Dental Injury: Teeth and Oropharynx as per pre-operative assessment

## 2018-11-13 NOTE — Progress Notes (Signed)
Day of Surgery Procedure(s) (LRB): TOTAL LAPAROSCOPIC HYSTERECTOMY WITH SALPINGECTOMY  REPAIR VAGIAL WALL LACERATION (Bilateral) CYSTOSCOPY  Post op rounds at 18:00. Interpretor present.   Subjective: Patient reports left incisional pain.   She has voided and walked in the hall with assistance. Taking Morphine, Toradol, and Percocet for pain.  Tolerating liquids.  Objective: I have reviewed patient's vital signs, intake and output and labs. Vitals:   11/13/18 1440 11/13/18 1746  BP: 128/81 130/78  Pulse: 96 (!) 104  Resp: 16   Temp: 98.4 F (36.9 C) 98.6 F (37 C)  SpO2: 96% 96%   WBC 15.3 Hgt 9.5 Glucose 179.  Creatinine 0.86.  General: alert and cooperative Resp: clear to auscultation bilaterally Cardio: regular rate and rhythm, S1, S2 normal, no murmur, click, rub or gallop GI: Abdomen soft and nontender.  Incisions intact.  Right incision with 3 cm firmness consistent with hematoma.  Marked with a pen around perimeter.  Extremities: PAS on.  DPs 2+ bilaterally.  Vaginal Bleeding: minimal  Assessment: s/p Procedure(s) with comments: TOTAL LAPAROSCOPIC HYSTERECTOMY WITH SALPINGECTOMY  REPAIR VAGIAL WALL LACERATION (Bilateral) - 2.5 hours needed. Extended recovery bed needed. Patient will need interpreter. CYSTOSCOPY: stable  Leukocytosis and anemia noted.   Plan: Advance diet Encourage ambulation Advance to PO medication  Will continue in hospital care and assess for discharge in the am.  Suspected hematoma discussed.  Surgical findings and procedure including repair of vaginal laceration discussed.  Will check CBC with diff in am.   LOS: 0 days    Arloa Koh 11/13/2018, 9:17 PM

## 2018-11-14 ENCOUNTER — Telehealth: Payer: Self-pay | Admitting: Obstetrics and Gynecology

## 2018-11-14 ENCOUNTER — Encounter (HOSPITAL_BASED_OUTPATIENT_CLINIC_OR_DEPARTMENT_OTHER): Payer: Self-pay | Admitting: Obstetrics and Gynecology

## 2018-11-14 DIAGNOSIS — N920 Excessive and frequent menstruation with regular cycle: Secondary | ICD-10-CM | POA: Diagnosis not present

## 2018-11-14 LAB — CBC WITH DIFFERENTIAL/PLATELET
Abs Immature Granulocytes: 0.07 10*3/uL (ref 0.00–0.07)
Basophils Absolute: 0 10*3/uL (ref 0.0–0.1)
Basophils Relative: 0 %
Eosinophils Absolute: 0 10*3/uL (ref 0.0–0.5)
Eosinophils Relative: 0 %
HCT: 28.7 % — ABNORMAL LOW (ref 36.0–46.0)
Hemoglobin: 9.1 g/dL — ABNORMAL LOW (ref 12.0–15.0)
Immature Granulocytes: 1 %
Lymphocytes Relative: 11 %
Lymphs Abs: 1.5 10*3/uL (ref 0.7–4.0)
MCH: 25.3 pg — ABNORMAL LOW (ref 26.0–34.0)
MCHC: 31.7 g/dL (ref 30.0–36.0)
MCV: 79.7 fL — ABNORMAL LOW (ref 80.0–100.0)
Monocytes Absolute: 1 10*3/uL (ref 0.1–1.0)
Monocytes Relative: 7 %
Neutro Abs: 11.7 10*3/uL — ABNORMAL HIGH (ref 1.7–7.7)
Neutrophils Relative %: 81 %
Platelets: 224 10*3/uL (ref 150–400)
RBC: 3.6 MIL/uL — ABNORMAL LOW (ref 3.87–5.11)
RDW: 15 % (ref 11.5–15.5)
WBC: 14.3 10*3/uL — ABNORMAL HIGH (ref 4.0–10.5)
nRBC: 0 % (ref 0.0–0.2)

## 2018-11-14 MED ORDER — OXYCODONE-ACETAMINOPHEN 5-325 MG PO TABS: 1.0000 | ORAL_TABLET | ORAL | 0 refills | Status: DC | PRN

## 2018-11-14 MED ORDER — OXYCODONE-ACETAMINOPHEN 5-325 MG PO TABS
ORAL_TABLET | ORAL | Status: AC
  Filled 2018-11-14: qty 1

## 2018-11-14 MED ORDER — KETOROLAC TROMETHAMINE 30 MG/ML IJ SOLN
INTRAMUSCULAR | Status: AC
  Filled 2018-11-14: qty 1

## 2018-11-14 MED ORDER — IBUPROFEN 800 MG PO TABS: 800.0000 mg | ORAL_TABLET | Freq: Three times a day (TID) | ORAL | 1 refills | Status: DC | PRN

## 2018-11-14 NOTE — Telephone Encounter (Signed)
Call reviewed with Dr. Quincy Simmonds, call returned to patients son. Reassured son ok to continue to monitor, Dr. Quincy Simmonds expects area may become darker and move downward. Recommended to keep f/u as scheduled for 6/3, return call to office if any concerns or questions. Son verbalizes understanding and is agreeable.   Routing to provider for final review. Patient is agreeable to disposition. Will close encounter.

## 2018-11-14 NOTE — Discharge Instructions (Signed)
Total Laparoscopic Hysterectomy, Care After °This sheet gives you information about how to care for yourself after your procedure. Your health care provider may also give you more specific instructions. If you have problems or questions, contact your health care provider. °What can I expect after the procedure? °After the procedure, it is common to have: °· Pain and bruising around your incisions. °· A sore throat, if a breathing tube was used during surgery. °· Fatigue. °· Poor appetite. °· Less interest in sex. °If your ovaries were also removed, it is also common to have symptoms of menopause such as hot flashes, night sweats, and lack of sleep (insomnia). °Follow these instructions at home: °Bathing °· Do not take baths, swim, or use a hot tub until your health care provider approves. You may need to only take showers for 2-3 weeks. °· Keep your bandage (dressing) dry until your health care provider says it can be removed. °Incision care ° °· Follow instructions from your health care provider about how to take care of your incisions. Make sure you: °? Wash your hands with soap and water before you change your dressing. If soap and water are not available, use hand sanitizer. °? Change your dressing as told by your health care provider. °? Leave stitches (sutures), skin glue, or adhesive strips in place. These skin closures may need to stay in place for 2 weeks or longer. If adhesive strip edges start to loosen and curl up, you may trim the loose edges. Do not remove adhesive strips completely unless your health care provider tells you to do that. °· Check your incision area every day for signs of infection. Check for: °? Redness, swelling, or pain. °? Fluid or blood. °? Warmth. °? Pus or a bad smell. °Activity °· Get plenty of rest and sleep. °· Do not lift anything that is heavier than 10 lbs (4.5 kg) for one month after surgery, or as long as told by your health care provider. °· Do not drive or use heavy  machinery while taking prescription pain medicine. °· Do not drive for 24 hours if you were given a medicine to help you relax (sedative). °· Return to your normal activities as told by your health care provider. Ask your health care provider what activities are safe for you. °Lifestyle ° °· Do not use any products that contain nicotine or tobacco, such as cigarettes and e-cigarettes. These can delay healing. If you need help quitting, ask your health care provider. °· Do not drink alcohol until your health care provider approves. °General instructions °· Do not douche, use tampons, or have sex for at least 6 weeks, or as told by your health care provider. °· Take over-the-counter and prescription medicines only as told by your health care provider. °· To monitor yourself for a fever, take your temperature at least once a day during recovery. °· If you struggle with physical or emotional changes after your procedure, speak with your health care provider or a therapist. °· To prevent or treat constipation while you are taking prescription pain medicine, your health care provider may recommend that you: °? Drink enough fluid to keep your urine clear or pale yellow. °? Take over-the-counter or prescription medicines. °? Eat foods that are high in fiber, such as fresh fruits and vegetables, whole grains, and beans. °? Limit foods that are high in fat and processed sugars, such as fried and sweet foods. °· Keep all follow-up visits as told by your health care provider.   This is important. °Contact a health care provider if: °· You have chills or a fever. °· You have redness, swelling, or pain around an incision. °· You have fluid or blood coming from an incision. °· Your incision feels warm to the touch. °· You have pus or a bad smell coming from an incision. °· An incision breaks open. °· You feel dizzy or light-headed. °· You have pain or bleeding when you urinate. °· You have diarrhea, nausea, or vomiting that does not  go away. °· You have abnormal vaginal discharge. °· You have a rash. °· You have pain that does not get better with medicine. °Get help right away if: °· You have a fever and your symptoms suddenly get worse. °· You have severe abdominal pain. °· You have chest pain. °· You have shortness of breath. °· You faint. °· You have pain, swelling, or redness on your leg. °· You have heavy vaginal bleeding with blood clots. °Summary °· After the procedure it is common to have abdominal pain. Your provider will give you medication for this. °· Do not take baths, swim, or use a hot tub until your health care provider approves. °· Do not lift anything that is heavier than 10 lbs (4.5 kg) for one month after surgery, or as long as told by your health care provider. °· Notify your provider if you have any signs or symptoms of infection after the procedure. °This information is not intended to replace advice given to you by your health care provider. Make sure you discuss any questions you have with your health care provider. °Document Released: 03/27/2013 Document Revised: 08/17/2016 Document Reviewed: 08/17/2016 °Elsevier Interactive Patient Education © 2019 [employer] ° °

## 2018-11-14 NOTE — Progress Notes (Signed)
Per Dr. Quincy Simmonds patient son regularly attends scheduled appointment with patient and acts as her interpreter during her visits.  Verified that patient prefers her son to interpret discharge instructions and is more comfortable with that. Per Dr. Quincy Simmonds son can interpret discharge instructions for patient.

## 2018-11-14 NOTE — Progress Notes (Signed)
1 Day Post-Op Procedure(s) (LRB): TOTAL LAPAROSCOPIC HYSTERECTOMY WITH SALPINGECTOMY  REPAIR VAGIAL WALL LACERATION (Bilateral) CYSTOSCOPY  Son, [name], is present for interpreting.  He has been participating in her presurgical care and planning as well in the office setting.  Subjective: Patient reports tolerating PO and no problems voiding.   Has bilateral lower abdominal discomfort.  Points to incisional areas. She is ambulating.  Feels tired.  Objective: I have reviewed patient's vital signs, intake and output and labs. Vitals:   11/14/18 0545 11/14/18 0815  BP: 99/62 99/65  Pulse: 79 73  Resp:    Temp: 98.1 F (36.7 C) 98.2 F (36.8 C)  SpO2: 96% 95%    WBC 14.3, 11.7 neutrophils.  (WBC 12.8 on 5/21//20.) Hgb 9.1.  General: alert and cooperative Resp: clear to auscultation bilaterally Cardio: regular rate and rhythm, S1, S2 normal, no murmur, click, rub or gallop GI: soft, non-tender; bowel sounds normal; no masses,  no organomegaly and incision: clean, dry, intact and Right lower quadrant hematoma 3 cm in size, slightly tender. Extremities: PAS on.  Vaginal Bleeding: minimal  Assessment: s/p Procedure(s) with comments: TOTAL LAPAROSCOPIC HYSTERECTOMY WITH SALPINGECTOMY  REPAIR VAGIAL WALL LACERATION (Bilateral) - 2.5 hours needed. Extended recovery bed needed. Patient will need interpreter. CYSTOSCOPY: stable  Stable right incisional hematoma.  Elevated WBC since prior to surgery.  Covid 19 negative.  Nonfocal exam.   Plan: Discharge home  Discharge instructions and precautions reviewed in verbal and written form.  Rx for Percocet and Motrin.  Do not take diabetic meds unless eating well.  FU in one week.    LOS: 0 days    Arloa Koh 11/14/2018, 8:49 AM

## 2018-11-14 NOTE — Telephone Encounter (Signed)
Patient's son [name] (on dpr) is calling to speak with nurse about a question he has after her surgery.

## 2018-11-14 NOTE — Telephone Encounter (Signed)
Patient has a known hematoma of her right lower abdominal incision.  I expect bruising to occur.

## 2018-11-14 NOTE — Telephone Encounter (Signed)
Spoke with patients son "[name]", ok per dpr. Patient is s/p [facility] 11/13/18. Reports "bruised area" on abdomen was marked prior to leaving hospital, was advised to call if area become larger or any other changes. Reports skin has become darker and has moved just outside of marked line, notices more when she is lying down. Denies pain, vaginal bleeding, increased swelling or any new symptoms.   Advised I will review with Dr. Quincy Simmonds and return call.

## 2018-11-15 NOTE — Anesthesia Postprocedure Evaluation (Signed)
Anesthesia Post Note  Patient: Shelley Escobar  Procedure(s) Performed: TOTAL LAPAROSCOPIC HYSTERECTOMY WITH SALPINGECTOMY  REPAIR VAGIAL WALL LACERATION (Bilateral ) CYSTOSCOPY     Patient location during evaluation: PACU Anesthesia Type: General Level of consciousness: sedated and patient cooperative Pain management: pain level controlled Vital Signs Assessment: post-procedure vital signs reviewed and stable Respiratory status: spontaneous breathing Cardiovascular status: stable Anesthetic complications: no    Last Vitals:  Vitals:   11/14/18 0815 11/14/18 0934  BP: 99/65 100/63  Pulse: 73 76  Resp:    Temp: 36.8 C 36.7 C  SpO2: 95% 98%    Last Pain:  Vitals:   11/14/18 0815  TempSrc:   PainSc: 2                  Nolon Nations

## 2018-11-17 ENCOUNTER — Telehealth: Payer: Self-pay | Admitting: Obstetrics and Gynecology

## 2018-11-17 NOTE — Telephone Encounter (Signed)
Phone call returned to patient's son, [name], who is interpreting and has experience doing this with his mother's visits.  Patient is post op from laparoscopic hysterectomy on 11/13/18.  Mother stating she is having chills.  She feels like she has a fever coming on.  They do not have a thermometer.  She feels really tired.  Decreased appetite.  Took Miralax and she had a bowel movement.   She states no vaginal bleeding.    The hematoma of her right lower incision is reducing is size but is is spreading onto her side.   No cough or shortness of breath.  No congestion.  No loss of taste.   Some back pain from laying on her back.   Son states she looks kind of pale from her baseline.  She denies dizziness or lightheadedness. She has chronic anemia, and she feels better since prior to surgery.   We discussed options of going to [facility] or [facility] ED at any time if she is feeling poorly versus getting a thermometer and calling me back with a temperature check.   They op for doing the temp check and calling me back.

## 2018-11-17 NOTE — Telephone Encounter (Signed)
Phone call back to patient's son to check on patient.   Temp 98.1 - 98.6 with repeat checks at home.  States patient had some minor drainage from an incision, but it stopped.  Reports she is eating well and feeling ok overall.   Will follow up next week for her post op check.

## 2018-11-20 ENCOUNTER — Telehealth: Payer: Self-pay | Admitting: Gynecology

## 2018-11-20 NOTE — Telephone Encounter (Signed)
On-call note 7:38 PM 11/19/2018: Son calls noting 1 of the patient's laparoscopic port sites is oozing blood.  Started whenever she got up and became more active moving around.  No pain.  No redness or pus around the incision site.  Doing great otherwise eating, drinking, voiding, having bowel movements.  No fever or chills.  Recommended sterile dressing for tonight.  Reinspect in the morning and if scabbed over and looks fine then to monitor.  If any concerns or continues to drain then office visit

## 2018-11-21 ENCOUNTER — Other Ambulatory Visit: Payer: Self-pay

## 2018-11-21 ENCOUNTER — Telehealth: Payer: Self-pay | Admitting: Obstetrics and Gynecology

## 2018-11-21 ENCOUNTER — Ambulatory Visit (INDEPENDENT_AMBULATORY_CARE_PROVIDER_SITE_OTHER): Payer: BLUE CROSS/BLUE SHIELD | Admitting: Obstetrics and Gynecology

## 2018-11-21 ENCOUNTER — Encounter: Payer: Self-pay | Admitting: Obstetrics and Gynecology

## 2018-11-21 ENCOUNTER — Ambulatory Visit: Payer: BLUE CROSS/BLUE SHIELD | Admitting: Obstetrics and Gynecology

## 2018-11-21 VITALS — BP 102/60 | HR 100 | Temp 98.2°F | Wt 162.4 lb

## 2018-11-21 DIAGNOSIS — D72828 Other elevated white blood cell count: Secondary | ICD-10-CM

## 2018-11-21 DIAGNOSIS — N898 Other specified noninflammatory disorders of vagina: Secondary | ICD-10-CM

## 2018-11-21 DIAGNOSIS — R300 Dysuria: Secondary | ICD-10-CM | POA: Diagnosis not present

## 2018-11-21 DIAGNOSIS — D508 Other iron deficiency anemias: Secondary | ICD-10-CM

## 2018-11-21 DIAGNOSIS — T148XXA Other injury of unspecified body region, initial encounter: Secondary | ICD-10-CM

## 2018-11-21 LAB — POCT URINALYSIS DIPSTICK
Bilirubin, UA: NEGATIVE
Blood, UA: POSITIVE
Glucose, UA: NEGATIVE
Ketones, UA: NEGATIVE
Nitrite, UA: NEGATIVE
Protein, UA: NEGATIVE
Spec Grav, UA: 1.01 (ref 1.010–1.025)
Urobilinogen, UA: 0.2 E.U./dL
pH, UA: 5 (ref 5.0–8.0)

## 2018-11-21 MED ORDER — LIDOCAINE 5 % EX OINT: 1.0000 "application " | TOPICAL_OINTMENT | Freq: Four times a day (QID) | CUTANEOUS | 0 refills | Status: DC | PRN

## 2018-11-21 NOTE — Discharge Summary (Signed)
Physician Discharge Summary  Patient ID: Shelley Escobar MRN: [id number] DOB/AGE: Nov 01, 1970 48 y.o.  Admit date: 11/13/2018 Discharge date: 11/14/18 Admission Diagnoses: 1.  Menorrhagia with regular menses.  2.  Uterine fibroid.  3.  Endometrial polyp.  4.  Suspected adenomyosis.  5.  Anemia. 6.  Leukocytosis.   Discharge Diagnoses:  1.  Menorrhagia with regular menses.  2.  Uterine fibroid.  3.  Endometrial polyp.  4.  Suspected adenomyosis.  5.  Anemia. 6.  Leukocytosis. 7.  Status post total laparoscopic hysterectomy with bilateral salpingectomy, cystoscopy, repair of small left vaginal sidewall laceration.  8.  Post op right lower quadrant incision hematoma.   Active Problems:   Menorrhagia with regular cycle   Status post laparoscopic hysterectomy   Discharged Condition: good  Hospital Course: The patient was admitted on 11/13/18 for a total laparoscopic hysterectomy with bilateral salpingectomy, cystoscopy, repair of left vaginal sidewall laceration which were performed  while under general anesthesia.  The patient's post op course was uneventful.  She received a morphine IV and Toradol for pain control initially, and this was converted over to Percocet and Motrin when the patient began taking po well.  She ambulated independently and wore PAS and Ted hose for DVT prophylaxis while in bed. She received Lovenox preop for DVT prophylaxis.  Her foley catheter were removed on post op day one, and she voided good volumes. The patient's vital signs remained stable and she demonstrated no signs of infection during her hospitalization.  The patient's post op day zero Hgb was 9.5, and her discharge hemoglobin on 5/27 was 9.1.   She had a mildly elevated WBC prior to surgery and was reportedly well.  her WBC was 14.3 at time of discharge, and she showed no signs of clinical infection.  Her preop COVID 19 testing was negative.  She had very minimal vaginal bleeding.  Her right lower quadrant  incision demonstrated a hematoma that was stable during her hospitalization.  She was found to be in good condition and ready for discharge on post op day one.  The intraoperative complication of small left vaginal sidewall laceration was discussed with the patient during her hospitalization. This was presumed to be due to the uterine manipulator.   Consults: None  Significant Diagnostic Studies: labs: See hospital course.   Treatments: surgery:  total laparoscopic hysterectomy with bilateral salpingectomy, cystoscopy, repair of left vaginal sidewall laceration on 11/13/18.  Discharge Exam: Blood pressure 100/63, pulse 76, temperature 98.1 F (36.7 C), resp. rate 16, height 5\' 3"  (1.6 m), weight 74.2 kg, last menstrual period 10/15/2018, SpO2 98 %. General: alert and cooperative Resp: clear to auscultation bilaterally Cardio: regular rate and rhythm, S1, S2 normal, no murmur, click, rub or gallop GI: soft, non-tender; bowel sounds normal; no masses,  no organomegaly and incision: clean, dry, intact and Right lower quadrant hematoma 3 cm in size, slightly tender. Extremities: PAS on.  Vaginal Bleeding: minimal  Disposition:    Patient received discharge instructions in verbal and written form.   Discharge Instructions    May discharge patient home 15 minutes after infusion if patient did not have any reaction.   Complete by:  As directed      Allergies as of 11/14/2018   No Known Allergies     Medication List    TAKE these medications   aspirin EC 81 MG tablet Take 81 mg by mouth 3 (three) times a week.   folic acid 1 MG tablet Commonly known as:  FOLVITE Take 2 tablets (2 mg total) by mouth daily.   FreeStyle Libre 14 Day Sensor Misc APPLY AS DIRECTED EVERY 14 DAYS   FreeStyle Libre Reader Devi Inject 1 Device into the skin See admin instructions.   gemfibrozil 600 MG tablet Commonly known as:  LOPID TAKE 1 TABLET(600 MG) BY MOUTH TWICE DAILY BEFORE A MEAL    ibuprofen 800 MG tablet Commonly known as:  ADVIL Take 1 tablet (800 mg total) by mouth every 8 (eight) hours as needed.   Insulin Glargine 100 UNIT/ML Solostar Pen Commonly known as:  Lantus SoloStar ADMINISTER 50 UNITS UNDER THE SKIN DAILY What changed:    how much to take  how to take this  when to take this  additional instructions   Insulin Pen Needle 31G X 5 MM Misc Commonly known as:  B-D UF III MINI PEN NEEDLES USE DAILY AS DIRECTED WITH INSULIN   lisinopril 2.5 MG tablet Commonly known as:  ZESTRIL Take 1 tablet (2.5 mg total) by mouth daily.   metFORMIN 1000 MG tablet Commonly known as:  GLUCOPHAGE TAKE 1 TABLET(1000 MG) BY MOUTH TWICE DAILY WITH A MEAL   oxyCODONE-acetaminophen 5-325 MG tablet Commonly known as:  PERCOCET/ROXICET Take 1-2 tablets by mouth every 4 (four) hours as needed for moderate pain.   Trulicity 1.61 WR/6.0AV Sopn Generic drug:  Dulaglutide INJECT 0.75 MG INTO THE SKIN ONCE A WEEK What changed:  See the new instructions.      Follow-up Information    Nunzio Cobbs, MD In 1 week.   Specialty:  Obstetrics and Gynecology Contact information: [address] [place] [address] [phone]           Signed: Arloa Koh 11/21/2018, 11:55 AM

## 2018-11-21 NOTE — Telephone Encounter (Signed)
Spoke with patients son "[name]", ok per dpr. S/p [facility] 11/13/18. Called to reschedule post-op visit with Dr. Quincy Simmonds. Son reports continuous oozing at incision. He called on-call provider on 6/2, was advised continue to monitor. Reports drainage unchanged, incision is closed, redness has developed at site. Reports burning with urination. Son is unsure if burning is occurring when urine touches skin or internally. Denies fever/chills. OV scheduled for today at 4:30pm with covering provider, Dr. Talbert Nan. Advised Dr. Talbert Nan can advise on f/u with Dr. Quincy Simmonds, can schedule before leaving office. Son verbalizes understanding.   Routing to provider for final review. Patient is agreeable to disposition. Will close encounter.  Cc: Dr. Quincy Simmonds

## 2018-11-21 NOTE — Telephone Encounter (Signed)
Patient developed a right lower quadrant incision hematoma soon after surgery.  This was stable at time of discharge.   Cc- Dr. Talbert Nan

## 2018-11-21 NOTE — Telephone Encounter (Signed)
Patient's post op appointment today has been cancelled. Will nurse please assist with rescheduling? Patient's son [name] (ok per dpr) is aware of cancellation.

## 2018-11-21 NOTE — Progress Notes (Signed)
GYNECOLOGY  VISIT   HPI: 48 y.o.   Married Asian Not Hispanic or Latino  female   [phone] with Patient's last menstrual period was 10/15/2018 (lmp unknown).   here for UTI symptoms post op and incision check.  She is 8 days s/p TLH/BS, cystoscopy and repair of left vaginal wall laceration.  She has had some bloody discharge from her right lower quadrant incision, no pus. Dark red blood. Only tender where the bruise is out. No abdominal pain. No fevers.  No urinary frequency or urgency. She c/o a 4 day h/o burning with urination, thinks the dysuria is external. She c/o vulvar itching over the same period of time. The itching is focal and in the same area that she is noticing burning. No vaginal d/c, no vaginal bleeding.   GYNECOLOGIC HISTORY: Patient's last menstrual period was 10/15/2018 (lmp unknown). Contraception:Hysterectomy Menopausal hormone therapy: None        OB History    Gravida  3   Para  3   Term  3   Preterm      AB      Living  3     SAB      TAB      Ectopic      Multiple      Live Births  3              Patient Active Problem List   Diagnosis Date Noted  . Status post laparoscopic hysterectomy 11/13/2018  . Menorrhagia with regular cycle 10/18/2018  . Endometrial polyp 10/18/2018  . Iron deficiency anemia secondary to inadequate dietary iron intake 05/23/2018  . IDA (iron deficiency anemia) 05/23/2018  . Uncontrolled type 2 diabetes mellitus without complication, with long-term current use of insulin ([place]) 01/03/2018  . Fatty infiltration of liver 03/10/2016  . Anemia 03/10/2016  . Obesity (BMI 30.0-34.9) 01/27/2012  . Dyslipidemia 07/02/2011    Past Medical History:  Diagnosis Date  . Acute appendicitis 01/13/2016  . Anemia    hx  . Chest pain    reports pain orginates in right shoulder and radiates to central chest; reports as intermittient, lasting 2 hours when it occurs, nothing makes it better or worse, doesnt radiate anywhere else,  not accompanied by sob , [place], vision change, nor diaphoresis, doesn't happen after large meals   . DM (diabetes mellitus) ([place])    2  . Endometrial polyp    Not removed as of 09/07/18  . Hyperlipidemia   . Menorrhagia with regular cycle   . SVD (spontaneous vaginal delivery)    x 3  . Vitamin D deficiency     Past Surgical History:  Procedure Laterality Date  . CYSTOSCOPY  11/13/2018   Procedure: CYSTOSCOPY;  Surgeon: Nunzio Cobbs, MD;  Location: [facility];  Service: Gynecology;;  . HYSTEROSCOPY W/D&C N/A 08/07/2013   Procedure: DILATATION AND CURETTAGE Pollyann Glen;  Surgeon: Azalia Bilis, MD;  Location: [facility];  Service: Gynecology;  Laterality: N/A;  . INTRAUTERINE DEVICE (IUD) INSERTION  09/13/2013   no longer in place   . LAPAROSCOPIC APPENDECTOMY N/A 01/13/2016   Procedure: APPENDECTOMY LAPAROSCOPIC;  Surgeon: Armandina Gemma, MD;  Location: [facility];  Service: General;  Laterality: N/A;  . TOTAL LAPAROSCOPIC HYSTERECTOMY WITH SALPINGECTOMY Bilateral 11/13/2018   Procedure: TOTAL LAPAROSCOPIC HYSTERECTOMY WITH SALPINGECTOMY  REPAIR VAGIAL WALL LACERATION;  Surgeon: Nunzio Cobbs, MD;  Location: [place];  Service: Gynecology;  Laterality: Bilateral;  2.5  hours needed. Extended recovery bed needed. Patient will need interpreter.  . TUBAL LIGATION     Age 28    Current Outpatient Medications  Medication Sig Dispense Refill  . aspirin EC 81 MG tablet Take 81 mg by mouth 3 (three) times a week.    . Continuous Blood Gluc Receiver (FREESTYLE LIBRE READER) DEVI Inject 1 Device into the skin See admin instructions. 1 Device 0  . Continuous Blood Gluc Sensor (FREESTYLE LIBRE 14 DAY SENSOR) MISC APPLY AS DIRECTED EVERY 14 DAYS 6 each 1  . folic acid (FOLVITE) 1 MG tablet Take 2 tablets (2 mg total) by mouth daily. 60 tablet 11  . gemfibrozil (LOPID) 600 MG tablet TAKE 1 TABLET(600 MG) BY MOUTH TWICE DAILY BEFORE A MEAL 180 tablet 3   . ibuprofen (ADVIL) 800 MG tablet Take 1 tablet (800 mg total) by mouth every 8 (eight) hours as needed. 30 tablet 1  . Insulin Glargine (LANTUS SOLOSTAR) 100 UNIT/ML Solostar Pen ADMINISTER 50 UNITS UNDER THE SKIN DAILY (Patient taking differently: Inject 40 Units into the skin at bedtime. ADMINISTER 40 UNITS UNDER THE SKIN DAILY) 15 mL 1  . Insulin Pen Needle (B-D UF III MINI PEN NEEDLES) 31G X 5 MM MISC USE DAILY AS DIRECTED WITH INSULIN 100 each 3  . lisinopril (PRINIVIL,ZESTRIL) 2.5 MG tablet Take 1 tablet (2.5 mg total) by mouth daily. 90 tablet 1  . metFORMIN (GLUCOPHAGE) 1000 MG tablet TAKE 1 TABLET(1000 MG) BY MOUTH TWICE DAILY WITH A MEAL 180 tablet 3  . oxyCODONE-acetaminophen (PERCOCET/ROXICET) 5-325 MG tablet Take 1-2 tablets by mouth every 4 (four) hours as needed for moderate pain. 30 tablet 0  . TRULICITY 4.96 PR/9.1MB SOPN INJECT 0.75 MG INTO THE SKIN ONCE A WEEK (Patient taking differently: Inject 0.75 mg into the skin every Friday. ) 2 mL 3   No current facility-administered medications for this visit.      ALLERGIES: Patient has no known allergies.  Family History  Adopted: Yes  Family history unknown: Yes    Social History   Socioeconomic History  . Marital status: Married    Spouse name: [name]  . Number of children: 3  . Years of education: none  . Highest education level: Not on file  Occupational History  . 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 Financial resource strain: Not on file  . Food insecurity:    Worry: Not on file    Inability: Not on file  . Transportation needs:    Medical: Not on file    Non-medical: Not on file  Tobacco Use  . Smoking status: Never Smoker  . Smokeless tobacco: Never Used  Substance and Sexual Activity  . Alcohol use: No    Alcohol/week: 0.0 standard drinks  . Drug use: No  . Sexual activity: Yes    Partners: Male    Birth  control/protection: Surgical    Comment: Tubal  Lifestyle  . Physical activity:    Days per week: Not on file    Minutes per session: Not on file  . Stress: Not on file  Relationships  . Social connections:    Talks on phone: Not on file    Gets together: Not on file    Attends religious service: Not on file    Active member of club or organization: Not on file    Attends meetings of clubs or organizations: Not on  file    Relationship status: Not on file  . Intimate partner violence:    Fear of current or ex partner: Not on file    Emotionally abused: Not on file    Physically abused: Not on file    Forced sexual activity: Not on file  Other Topics Concern  . 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      Married in 1983 and has 3 grown children, 2 boys and one girl.       Lives with husband and two boys. Daughter lives locally.   Smoke detectors in home? Yes    Guns in home? No    Consistent seatbelt use? Yes    Consistent dental brushing and flossing? Yes    Semi-Annual dental visits: No    Annual Eye exams: No         Review of Systems  Constitutional: Negative.   HENT: Negative.   Eyes: Negative.   Respiratory: Negative.   Cardiovascular: Negative.   Gastrointestinal: Negative.   Genitourinary:       Drainage from right incision site Burning with urination  Musculoskeletal: Negative.   Skin: Negative.   Neurological: Negative.   Endo/Heme/Allergies: Negative.   Psychiatric/Behavioral: Negative.     PHYSICAL EXAMINATION:    BP 102/60 (BP Location: Right Arm, Patient Position: Sitting, Cuff Size: Normal)   Pulse 100   Temp 98.2 F (36.8 C) (Skin)   Wt 162 lb 6.4 oz (73.7 kg)   LMP 10/15/2018 (LMP Unknown)   BMI 28.77 kg/m     General appearance: alert, cooperative and appears stated age Abdomen: soft, non-tender; non distended, no masses,  no  organomegaly Incisions: Decreasing size of previously marked hematoma around the RLQ incision, no surrounding erythema, no active drainage. Appears to be healing fine. Other incisions are healing well.   Pelvic: External genitalia:  no lesions, there is a just under 1 cm laceration at the vaginal opening at 6 o'clock, no signs of infection. The patient reports that is the area of pain and itching.               Urethra:  normal appearing urethra with no masses, tenderness or lesions              Bartholins and Skenes: normal                 Vagina: normal appearing vagina with an increase in brown, watery vaginal discharge. Cuff is intact and not tender              Cervix: no lesions              Bimanual Exam:  Uterus:  uterus absent              Adnexa: no mass, fullness, tenderness               Chaperone was present for exam.  Wet prep: ? clue, no trich, ++ wbc KOH: no yeast PH: 5   ASSESSMENT Incisional hematoma, smaller, slight drainage, no signs of infection. Patient reassured  Dysuria (external) and itching, suspect all from small laceration at the opening of the vagina (no signs of infection). Symptoms started when she stopped her pain medication Vaginal d/c on exam suspicious for BV, slides not clear Elevated WBC and anemia post op    PLAN Treat laceration with lidocaine ointment  prn, can also use vaseline externally Send affirm Send urine for ua, c&s CBC with diff Call with any concerns   An After Visit Summary was printed and given to the patient.  ~20 minutes face to face time of which over 50% was spent in counseling.   CC: Dr Quincy Simmonds

## 2018-11-22 LAB — CBC WITH DIFFERENTIAL/PLATELET
Basophils Absolute: 0 10*3/uL (ref 0.0–0.2)
Basos: 0 %
EOS (ABSOLUTE): 0.4 10*3/uL (ref 0.0–0.4)
Eos: 4 %
Hematocrit: 32.5 % — ABNORMAL LOW (ref 34.0–46.6)
Hemoglobin: 10.1 g/dL — ABNORMAL LOW (ref 11.1–15.9)
Immature Grans (Abs): 0 10*3/uL (ref 0.0–0.1)
Immature Granulocytes: 0 %
Lymphocytes Absolute: 2.2 10*3/uL (ref 0.7–3.1)
Lymphs: 22 %
MCH: 24.8 pg — ABNORMAL LOW (ref 26.6–33.0)
MCHC: 31.1 g/dL — ABNORMAL LOW (ref 31.5–35.7)
MCV: 80 fL (ref 79–97)
Monocytes Absolute: 0.7 10*3/uL (ref 0.1–0.9)
Monocytes: 7 %
Neutrophils Absolute: 6.7 10*3/uL (ref 1.4–7.0)
Neutrophils: 67 %
Platelets: 332 10*3/uL (ref 150–450)
RBC: 4.07 x10E6/uL (ref 3.77–5.28)
RDW: 15.8 % — ABNORMAL HIGH (ref 11.7–15.4)
WBC: 10 10*3/uL (ref 3.4–10.8)

## 2018-11-22 LAB — VAGINITIS/VAGINOSIS, DNA PROBE
Candida Species: NEGATIVE
Gardnerella vaginalis: NEGATIVE
Trichomonas vaginosis: NEGATIVE

## 2018-11-23 DIAGNOSIS — E119 Type 2 diabetes mellitus without complications: Secondary | ICD-10-CM | POA: Insufficient documentation

## 2018-11-23 LAB — URINE CULTURE

## 2018-11-24 LAB — URINALYSIS, MICROSCOPIC ONLY
Casts: NONE SEEN /lpf
Epithelial Cells (non renal): >10 /hpf — AB (ref 0–10)

## 2018-12-04 ENCOUNTER — Other Ambulatory Visit: Payer: BLUE CROSS/BLUE SHIELD

## 2018-12-04 ENCOUNTER — Ambulatory Visit: Payer: BLUE CROSS/BLUE SHIELD

## 2018-12-04 ENCOUNTER — Ambulatory Visit: Payer: BLUE CROSS/BLUE SHIELD | Admitting: Family

## 2018-12-26 ENCOUNTER — Other Ambulatory Visit: Payer: Self-pay

## 2018-12-26 ENCOUNTER — Ambulatory Visit (INDEPENDENT_AMBULATORY_CARE_PROVIDER_SITE_OTHER): Payer: BLUE CROSS/BLUE SHIELD | Admitting: Obstetrics and Gynecology

## 2018-12-26 ENCOUNTER — Encounter: Payer: Self-pay | Admitting: Obstetrics and Gynecology

## 2018-12-26 VITALS — BP 102/68 | HR 84 | Temp 97.3°F | Resp 12 | Ht 63.5 in | Wt 160.0 lb

## 2018-12-26 DIAGNOSIS — Z90710 Acquired absence of both cervix and uterus: Secondary | ICD-10-CM

## 2018-12-26 DIAGNOSIS — D649 Anemia, unspecified: Secondary | ICD-10-CM

## 2018-12-26 NOTE — Progress Notes (Signed)
GYNECOLOGY  VISIT   HPI: 48 y.o.   Married  Asian  female   [phone] with Patient's last menstrual period was 10/15/2018 (lmp unknown).   here for 6 week post op  TOTAL LAPAROSCOPIC HYSTERECTOMY WITH SALPINGECTOMY  REPAIR VAGIAL WALL LACERATION (Bilateral ) CYSTOSCOPY done 11/13/18.  Final pathology - adenomyosis.   Interpretor present for the entire visit.   She had pre-op and post op anemia.  Last Hgb 10.1 on 11/21/18.  She was seen for pain by my partner on 11/22/18 for dysuria.  She had a negative UC and negative vaginitis testing.  She was given a prescription for Lidocaine ointment. She was given this for pain from a vaginal laceration.   No vaginal bleeding.  She has more energy now.   She notes her blood sugar is lower.  She has been seen by her medical provider.    GYNECOLOGIC HISTORY: Patient's last menstrual period was 10/15/2018 (lmp unknown). Contraception:  Hysterectomy Menopausal hormone therapy:  none Last mammogram:  09/18/17 BIRADS 1 negative/density b Last pap smear:  06-14-18 neg HPV HR neg, 06-07-16 neg        OB History    Gravida  3   Para  3   Term  3   Preterm      AB      Living  3     SAB      TAB      Ectopic      Multiple      Live Births  3              Patient Active Problem List   Diagnosis Date Noted  . Status post laparoscopic hysterectomy 11/13/2018  . Menorrhagia with regular cycle 10/18/2018  . Endometrial polyp 10/18/2018  . Iron deficiency anemia secondary to inadequate dietary iron intake 05/23/2018  . IDA (iron deficiency anemia) 05/23/2018  . Uncontrolled type 2 diabetes mellitus without complication, with long-term current use of insulin ([place]) 01/03/2018  . Fatty infiltration of liver 03/10/2016  . Anemia 03/10/2016  . Obesity (BMI 30.0-34.9) 01/27/2012  . Dyslipidemia 07/02/2011    Past Medical History:  Diagnosis Date  . Acute appendicitis 01/13/2016  . Anemia    hx  . Chest pain    reports pain  orginates in right shoulder and radiates to central chest; reports as intermittient, lasting 2 hours when it occurs, nothing makes it better or worse, doesnt radiate anywhere else, not accompanied by sob , [place], vision change, nor diaphoresis, doesn't happen after large meals   . DM (diabetes mellitus) ([place])    2  . Endometrial polyp    Not removed as of 09/07/18  . Hyperlipidemia   . Menorrhagia with regular cycle   . SVD (spontaneous vaginal delivery)    x 3  . Vitamin D deficiency     Past Surgical History:  Procedure Laterality Date  . CYSTOSCOPY  11/13/2018   Procedure: CYSTOSCOPY;  Surgeon: Nunzio Cobbs, MD;  Location: [facility];  Service: Gynecology;;  . HYSTEROSCOPY W/D&C N/A 08/07/2013   Procedure: DILATATION AND CURETTAGE Pollyann Glen;  Surgeon: Azalia Bilis, MD;  Location: [facility];  Service: Gynecology;  Laterality: N/A;  . INTRAUTERINE DEVICE (IUD) INSERTION  09/13/2013   no longer in place   . LAPAROSCOPIC APPENDECTOMY N/A 01/13/2016   Procedure: APPENDECTOMY LAPAROSCOPIC;  Surgeon: Armandina Gemma, MD;  Location: [facility];  Service: General;  Laterality: N/A;  . TOTAL LAPAROSCOPIC HYSTERECTOMY WITH SALPINGECTOMY  Bilateral 11/13/2018   Procedure: TOTAL LAPAROSCOPIC HYSTERECTOMY WITH SALPINGECTOMY  REPAIR VAGIAL WALL LACERATION;  Surgeon: Nunzio Cobbs, MD;  Location: [facility];  Service: Gynecology;  Laterality: Bilateral;  2.5 hours needed. Extended recovery bed needed. Patient will need interpreter.  . TUBAL LIGATION     Age 47    Current Outpatient Medications  Medication Sig Dispense Refill  . aspirin EC 81 MG tablet Take 81 mg by mouth 3 (three) times a week.    Marland Kitchen atorvastatin (LIPITOR) 40 MG tablet Take 40 mg by mouth daily.    . Continuous Blood Gluc Receiver (FREESTYLE LIBRE READER) DEVI Inject 1 Device into the skin See admin instructions. 1 Device 0  . Continuous Blood Gluc Sensor (FREESTYLE LIBRE 14 DAY SENSOR)  MISC APPLY AS DIRECTED EVERY 14 DAYS 6 each 1  . folic acid (FOLVITE) 1 MG tablet Take 2 tablets (2 mg total) by mouth daily. 60 tablet 11  . gemfibrozil (LOPID) 600 MG tablet TAKE 1 TABLET(600 MG) BY MOUTH TWICE DAILY BEFORE A MEAL 180 tablet 3  . ibuprofen (ADVIL) 800 MG tablet Take 1 tablet (800 mg total) by mouth every 8 (eight) hours as needed. 30 tablet 1  . Insulin Glargine (LANTUS SOLOSTAR) 100 UNIT/ML Solostar Pen ADMINISTER 50 UNITS UNDER THE SKIN DAILY (Patient taking differently: Inject 40 Units into the skin at bedtime. ADMINISTER 40 UNITS UNDER THE SKIN DAILY) 15 mL 1  . Insulin Pen Needle (B-D UF III MINI PEN NEEDLES) 31G X 5 MM MISC USE DAILY AS DIRECTED WITH INSULIN 100 each 3  . lidocaine (XYLOCAINE) 5 % ointment Apply 1 application topically 4 (four) times daily as needed. 30 g 0  . lisinopril (PRINIVIL,ZESTRIL) 2.5 MG tablet Take 1 tablet (2.5 mg total) by mouth daily. 90 tablet 1  . metFORMIN (GLUCOPHAGE) 1000 MG tablet TAKE 1 TABLET(1000 MG) BY MOUTH TWICE DAILY WITH A MEAL 180 tablet 3  . TRULICITY 2.97 LG/9.2JJ SOPN INJECT 0.75 MG INTO THE SKIN ONCE A WEEK (Patient taking differently: Inject 0.75 mg into the skin every Friday. ) 2 mL 3   No current facility-administered medications for this visit.      ALLERGIES: Patient has no known allergies.  Family History  Adopted: Yes  Family history unknown: Yes    Social History   Socioeconomic History  . Marital status: Married    Spouse name: [name]  . Number of children: 3  . Years of education: none  . Highest education level: Not on file  Occupational History  . 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 Financial resource strain: Not on file  . Food insecurity    Worry: Not on file    Inability: Not on file  . Transportation needs    Medical: Not on file    Non-medical: Not on file  Tobacco Use  . Smoking status: Never Smoker  .  Smokeless tobacco: Never Used  Substance and Sexual Activity  . Alcohol use: No    Alcohol/week: 0.0 standard drinks  . Drug use: No  . Sexual activity: Yes    Partners: Male    Birth control/protection: Surgical    Comment: Hysterectomy  Lifestyle  . Physical activity    Days per week: Not on file    Minutes per session: Not on file  . Stress: Not on file  Relationships  . Social connections  Talks on phone: Not on file    Gets together: Not on file    Attends religious service: Not on file    Active member of club or organization: Not on file    Attends meetings of clubs or organizations: Not on file    Relationship status: Not on file  . Intimate partner violence    Fear of current or ex partner: Not on file    Emotionally abused: Not on file    Physically abused: Not on file    Forced sexual activity: Not on file  Other Topics Concern  . 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      Married in 1983 and has 3 grown children, 2 boys and one girl.       Lives with husband and two boys. Daughter lives locally.   Smoke detectors in home? Yes    Guns in home? No    Consistent seatbelt use? Yes    Consistent dental brushing and flossing? Yes    Semi-Annual dental visits: No    Annual Eye exams: No         Review of Systems  Constitutional: Negative.   HENT: Negative.   Eyes: Negative.   Respiratory: Negative.   Cardiovascular: Negative.   Gastrointestinal: Negative.   Endocrine: Negative.   Genitourinary: Negative.   Musculoskeletal: Negative.   Skin: Negative.   Allergic/Immunologic: Negative.   Neurological: Negative.   Hematological: Negative.   Psychiatric/Behavioral: Negative.     PHYSICAL EXAMINATION:    BP 102/68   Pulse 84   Temp (!) 97.3 F (36.3 C) (Temporal)   Resp 12   Ht 5' 3.5" (1.613 m)   Wt 160 lb (72.6 kg)   LMP 10/15/2018 (LMP  Unknown)   BMI 27.90 kg/m     General appearance: alert, cooperative and appears stated age   Abdomen: incisions intact, abdomen is soft, non-tender, no masses,  no organomegaly  Pelvic: External genitalia:  no lesions              Urethra:  normal appearing urethra with no masses, tenderness or lesions              Bartholins and Skenes: normal                 Vagina: normal appearing vagina with normal color and discharge, no lesions.  Left vaginal wall sutures dissolved.               Cervix: absent.  Cuff intact.  Suture present.                Bimanual Exam:  Uterus:   absent              Adnexa: no mass, fullness, tenderness         Chaperone was present for exam.  ASSESSMENT  Status post laparoscopic hysterectomy, bilateral salpingectomy, cystoscopy, suturing of left vaginal wall laceration.  Doing well post op.  Chronic anemia.   PLAN  We reviewed surgical pathology reports.  Return to normal activity in 2 weeks.  Cautioned about having intercourse before then.  I reviewed risk of vaginal cuff dehiscence.  Will check CBC.  Follow up for yearly exams and prn.  She is out of network for her insurance, so she will decide if she will continue her care here or elsewhere.  An After Visit Summary was printed and given to the patient.

## 2018-12-27 LAB — CBC
Hematocrit: 35.3 % (ref 34.0–46.6)
Hemoglobin: 11.4 g/dL (ref 11.1–15.9)
MCH: 25.7 pg — ABNORMAL LOW (ref 26.6–33.0)
MCHC: 32.3 g/dL (ref 31.5–35.7)
MCV: 80 fL (ref 79–97)
Platelets: 241 10*3/uL (ref 150–450)
RBC: 4.43 x10E6/uL (ref 3.77–5.28)
RDW: 14 % (ref 11.7–15.4)
WBC: 9.3 10*3/uL (ref 3.4–10.8)

## 2019-01-10 ENCOUNTER — Encounter (HOSPITAL_BASED_OUTPATIENT_CLINIC_OR_DEPARTMENT_OTHER): Payer: Self-pay | Admitting: Obstetrics and Gynecology

## 2019-02-08 DIAGNOSIS — I10 Essential (primary) hypertension: Secondary | ICD-10-CM | POA: Insufficient documentation

## 2019-02-08 DIAGNOSIS — M47819 Spondylosis without myelopathy or radiculopathy, site unspecified: Secondary | ICD-10-CM | POA: Insufficient documentation

## 2019-03-13 ENCOUNTER — Encounter: Payer: Self-pay | Admitting: Gynecology

## 2019-06-06 DIAGNOSIS — M546 Pain in thoracic spine: Secondary | ICD-10-CM | POA: Insufficient documentation

## 2019-06-06 DIAGNOSIS — G8929 Other chronic pain: Secondary | ICD-10-CM | POA: Insufficient documentation

## 2019-09-06 DIAGNOSIS — M5414 Radiculopathy, thoracic region: Secondary | ICD-10-CM | POA: Insufficient documentation

## 2020-07-21 ENCOUNTER — Other Ambulatory Visit: Payer: Self-pay

## 2020-07-22 ENCOUNTER — Ambulatory Visit (INDEPENDENT_AMBULATORY_CARE_PROVIDER_SITE_OTHER): Payer: 59 | Admitting: Nurse Practitioner

## 2020-07-22 ENCOUNTER — Encounter: Payer: Self-pay | Admitting: Nurse Practitioner

## 2020-07-22 VITALS — BP 114/74 | HR 84 | Temp 97.7°F | Ht 62.0 in | Wt 162.2 lb

## 2020-07-22 DIAGNOSIS — E1165 Type 2 diabetes mellitus with hyperglycemia: Secondary | ICD-10-CM

## 2020-07-22 DIAGNOSIS — Z794 Long term (current) use of insulin: Secondary | ICD-10-CM

## 2020-07-22 DIAGNOSIS — Z1231 Encounter for screening mammogram for malignant neoplasm of breast: Secondary | ICD-10-CM | POA: Diagnosis not present

## 2020-07-22 LAB — MICROALBUMIN / CREATININE URINE RATIO
Creatinine,U: 68 mg/dL
Microalb Creat Ratio: 1.4 mg/g (ref 0.0–30.0)
Microalb, Ur: 1 mg/dL (ref 0.0–1.9)

## 2020-07-22 LAB — BASIC METABOLIC PANEL
BUN: 17 mg/dL (ref 6–23)
CO2: 29 mEq/L (ref 19–32)
Calcium: 10 mg/dL (ref 8.4–10.5)
Chloride: 101 mEq/L (ref 96–112)
Creatinine, Ser: 0.64 mg/dL (ref 0.40–1.20)
GFR: 101.92 mL/min (ref >60.00)
Glucose, Bld: 87 mg/dL (ref 70–99)
Potassium: 4.1 mEq/L (ref 3.5–5.1)
Sodium: 137 mEq/L (ref 135–145)

## 2020-07-22 LAB — HEMOGLOBIN A1C: Hgb A1c MFr Bld: 7.1 % — ABNORMAL HIGH (ref 4.6–6.5)

## 2020-07-22 NOTE — Progress Notes (Signed)
Subjective:  Patient ID: Shelley Escobar, female    DOB: [birth year]  Age: 50 y.o. MRN: [id number]  CC: Establish Care (New patient/DM pt in need of medication refills.)  HPI Accompanied by son who helps to interpret.  Type 2 diabetes mellitus without complication, with long-term current use of insulin (HCC) Controlled:hgbA1c of 7.1% Will schedule appt with ophthalmology Normal urine microalbumin No neuropathy.  D/c lantus Decrease metformin to 850mg  BID Continue ozempic at 0.5mg  weekly. F/up in 41months  Reviewed past Medical, Social and Family history today.  Outpatient Medications Prior to Visit  Medication Sig Dispense Refill  . clotrimazole-betamethasone (LOTRISONE) cream Apply 1 application topically 2 (two) times daily.    . Continuous Blood Gluc Receiver (FREESTYLE LIBRE READER) DEVI Inject 1 Device into the skin See admin instructions. 1 Device 0  . Continuous Blood Gluc Sensor (FREESTYLE LIBRE 14 DAY SENSOR) MISC APPLY AS DIRECTED EVERY 14 DAYS 6 each 1  . estradiol (ESTRACE) 0.1 MG/GM vaginal cream Place 1 Applicatorful vaginally at bedtime.    Marland Kitchen gemfibrozil (LOPID) 600 MG tablet TAKE 1 TABLET(600 MG) BY MOUTH TWICE DAILY BEFORE A MEAL 180 tablet 3  . Insulin Pen Needle (B-D UF III MINI PEN NEEDLES) 31G X 5 MM MISC USE DAILY AS DIRECTED WITH INSULIN 100 each 3  . lidocaine (XYLOCAINE) 5 % ointment Apply 1 application topically 4 (four) times daily as needed. 30 g 0  . lisinopril (PRINIVIL,ZESTRIL) 2.5 MG tablet Take 1 tablet (2.5 mg total) by mouth daily. 90 tablet 1  . valACYclovir (VALTREX) 500 MG tablet Take 500 mg by mouth 2 (two) times daily.    . Insulin Glargine (LANTUS SOLOSTAR) 100 UNIT/ML Solostar Pen ADMINISTER 50 UNITS UNDER THE SKIN DAILY (Patient taking differently: Inject 40 Units into the skin at bedtime. ADMINISTER 40 UNITS UNDER THE SKIN DAILY) 15 mL 1  . metFORMIN (GLUCOPHAGE) 1000 MG tablet TAKE 1 TABLET(1000 MG) BY MOUTH TWICE DAILY WITH A MEAL 180 tablet  3  . Semaglutide (OZEMPIC, 0.25 OR 0.5 MG/DOSE, [place]) Inject into the skin.    Marland Kitchen aspirin EC 81 MG tablet Take 81 mg by mouth 3 (three) times a week. (Patient not taking: Reported on 07/22/2020)    . atorvastatin (LIPITOR) 40 MG tablet Take 40 mg by mouth daily. (Patient not taking: Reported on 07/22/2020)    . folic acid (FOLVITE) 1 MG tablet Take 2 tablets (2 mg total) by mouth daily. (Patient not taking: Reported on 07/22/2020) 60 tablet 11  . ibuprofen (ADVIL) 800 MG tablet Take 1 tablet (800 mg total) by mouth every 8 (eight) hours as needed. (Patient not taking: Reported on 07/22/2020) 30 tablet 1  . insulin glargine (LANTUS SOLOSTAR) 100 UNIT/ML Solostar Pen Inject 10 Units into the skin at bedtime. 15 mL 1  . TRULICITY 4.09 WJ/1.9JY SOPN INJECT 0.75 MG INTO THE SKIN ONCE A WEEK (Patient not taking: Reported on 07/22/2020) 2 mL 3   No facility-administered medications prior to visit.    ROS See HPI  Objective:  BP 114/74 (BP Location: Left Arm, Patient Position: Sitting, Cuff Size: Normal)   Pulse 84   Temp 97.7 F (36.5 C) (Temporal)   Ht 5\' 2"  (1.575 m)   Wt 162 lb 3.2 oz (73.6 kg)   LMP 10/15/2018 (LMP Unknown)   SpO2 97%   BMI 29.67 kg/m   Physical Exam Cardiovascular:     Rate and Rhythm: Normal rate and regular rhythm.     Pulses: Normal pulses.  Heart sounds: Normal heart sounds.  Pulmonary:     Effort: Pulmonary effort is normal.     Breath sounds: Normal breath sounds.  Musculoskeletal:     Right lower leg: No edema.     Left lower leg: No edema.  Neurological:     Mental Status: [name], place, and time.    Assessment & Plan:  This visit occurred during the SARS-CoV-2 public health emergency.  Safety protocols were in place, including screening questions prior to the visit, additional usage of staff PPE, and extensive cleaning of exam room while observing appropriate contact time as indicated for disinfecting solutions.   [name] was  seen today for establish care.  Diagnoses and all orders for this visit:  Type 2 diabetes mellitus with hyperglycemia, without long-term current use of insulin (HCC) -     Hemoglobin A1c -     Microalbumin / creatinine urine ratio -     Basic metabolic panel -     metFORMIN (GLUCOPHAGE) 850 MG tablet; Take 1 tablet (850 mg total) by mouth 2 (two) times daily with a meal. -     Semaglutide,0.25 or 0.5MG /DOS, (OZEMPIC, 0.25 OR 0.5 MG/DOSE,) 2 MG/1.5ML SOPN; Inject 0.5 mg into the skin once a week.  Encounter for screening mammogram for malignant neoplasm of breast -     MM DIGITAL SCREENING BILATERAL; Future   Problem List Items Addressed This Visit      Endocrine   Type 2 diabetes mellitus without complication, with long-term current use of insulin ([place]) - Primary    Controlled:hgbA1c of 7.1% Will schedule appt with ophthalmology Normal urine microalbumin No neuropathy.  D/c lantus Decrease metformin to 850mg  BID Continue ozempic at 0.5mg  weekly. F/up in 94months      Relevant Medications   metFORMIN (GLUCOPHAGE) 850 MG tablet   Semaglutide,0.25 or 0.5MG /DOS, (OZEMPIC, 0.25 OR 0.5 MG/DOSE,) 2 MG/1.5ML SOPN    Other Visit Diagnoses    Encounter for screening mammogram for malignant neoplasm of breast       Relevant Orders   MM DIGITAL SCREENING BILATERAL      Follow-up: Return in about 3 months (around 10/19/2020) for DM and , hyperlipidemia (fasting, 42mins, interpreter needed).  Wilfred Lacy, NP

## 2020-07-22 NOTE — Patient Instructions (Addendum)
Thank you for choosing [facility].

## 2020-07-23 ENCOUNTER — Encounter: Payer: Self-pay | Admitting: Nurse Practitioner

## 2020-07-23 MED ORDER — METFORMIN HCL 850 MG PO TABS: 850.0000 mg | ORAL_TABLET | Freq: Two times a day (BID) | ORAL | 12 refills | Status: DC

## 2020-07-23 MED ORDER — OZEMPIC (0.25 OR 0.5 MG/DOSE) 2 MG/1.5ML ~~LOC~~ SOPN: 0.5000 mg | PEN_INJECTOR | SUBCUTANEOUS | 5 refills | Status: DC

## 2020-07-23 NOTE — Assessment & Plan Note (Addendum)
Controlled:hgbA1c of 7.1% Will schedule appt with ophthalmology Normal urine microalbumin No neuropathy.  D/c lantus Decrease metformin to 850mg  BID Continue ozempic at 0.5mg  weekly. F/up in 74months

## 2020-10-27 ENCOUNTER — Other Ambulatory Visit: Payer: Self-pay

## 2020-10-28 ENCOUNTER — Ambulatory Visit (INDEPENDENT_AMBULATORY_CARE_PROVIDER_SITE_OTHER): Payer: 59 | Admitting: Nurse Practitioner

## 2020-10-28 ENCOUNTER — Encounter: Payer: Self-pay | Admitting: Nurse Practitioner

## 2020-10-28 VITALS — BP 100/70 | HR 88 | Temp 97.6°F | Wt 161.6 lb

## 2020-10-28 DIAGNOSIS — K635 Polyp of colon: Secondary | ICD-10-CM | POA: Insufficient documentation

## 2020-10-28 DIAGNOSIS — K5909 Other constipation: Secondary | ICD-10-CM

## 2020-10-28 DIAGNOSIS — E1165 Type 2 diabetes mellitus with hyperglycemia: Secondary | ICD-10-CM | POA: Diagnosis not present

## 2020-10-28 DIAGNOSIS — Z794 Long term (current) use of insulin: Secondary | ICD-10-CM

## 2020-10-28 DIAGNOSIS — E1142 Type 2 diabetes mellitus with diabetic polyneuropathy: Secondary | ICD-10-CM

## 2020-10-28 DIAGNOSIS — Z1231 Encounter for screening mammogram for malignant neoplasm of breast: Secondary | ICD-10-CM | POA: Diagnosis not present

## 2020-10-28 DIAGNOSIS — I10 Essential (primary) hypertension: Secondary | ICD-10-CM

## 2020-10-28 LAB — POCT GLYCOSYLATED HEMOGLOBIN (HGB A1C): Hemoglobin A1C: 7.4 % — AB (ref 4.0–5.6)

## 2020-10-28 MED ORDER — OZEMPIC (0.25 OR 0.5 MG/DOSE) 2 MG/1.5ML ~~LOC~~ SOPN: 0.5000 mg | PEN_INJECTOR | SUBCUTANEOUS | 5 refills | Status: DC

## 2020-10-28 MED ORDER — LUBIPROSTONE 8 MCG PO CAPS: 8.0000 ug | ORAL_CAPSULE | Freq: Two times a day (BID) | ORAL | 5 refills | Status: DC

## 2020-10-28 NOTE — Patient Instructions (Addendum)
Stop all laxative Start amitiza for constipation. Call office if no improvement in 1-2weeks Wear shoes at all times You will be contacted to schedule appt with ophthalmology and for mammogram HgbA1c of 7.4: maintain current medications  To bn, Ng??i l?n Constipation, Adult To bn l khi m?t ng??i ?i ??i ti?n t h?n ba l?n trong m?t tu?n, ??i ti?n kh kh?n, ho?c ??i ti?n ra phn (phn) kh, c?ng, ho?c to h?n bnh th??ng. To bn c th? do m?t tnh tr?ng bn trong gy ra. N c th? tr? ln tr?m tr?ng h?n theo ?? tu?i n?u m?t ng??i dng cc lo?i thu?c nh?t ??nh v khng u?ng ?? n??c. Tun th? nh?ng h??ng d?n ny ? nh: ?n v u?ng  ?n th?c ?n c nhi?u ch?t x? nh? ??u, ng? c?c nguyn h?t, tri cy t??i v rau c? t??i.  H?n ch? cc lo?i th?c ?n t ch?t x? v giu ch?t bo v ???ng tinh luy?n, ch?ng h?n nh? ?? ?n chin rn ho?c ?? ng?t. Nh?ng th?c ph?m ny bao g?m khoai ty chin, bnh hamburger, bnh quy, k?o v soda.  U?ng ?? n??c ?? gi? cho n??c ti?u c mu vng nh?t.   H??ng d?n chung  T?p th? d?c th??ng xuyn theo ch? d?n c?a chuyn gia ch?m [place] s?c kh?e. C? g?ng t?p th? d?c ? m?c ?? v?a ph?i 150 pht m?i tu?n.  ?i v? sinh khi qu v? bu?n ?i ??i ti?n. Khng nh?n ?i ??i ti?n.  Ch? s? d?ng thu?c khng k ??n v thu?c k ??n theo ch? d?n c?a chuyn gia ch?m [place] s?c kh?e. Thu?c ny g?m c? b?t k? lo?i th?c ph?m ch?c n?ng c ch?t x? no.  Trong khi ??i ti?n: ? Th?c hnh th? su trong khi th? l?ng b?ng d??i. ? Th?c hnh th? l?ng sn ch?u.  Theo di tnh tr?ng c?a qu v? ?? pht hi?n b?t k? thay ??i no. Hy cho nguyn gia ch?m [place] s?c kh?e bi?t v? cc thay ??i ?.  Tun th? t?t c? cc l?n khm theo di theo ch? d?n c?a chuyn gia ch?m [place] s?c kh?e. ?i?u ny c vai tr quan tr?ng. Hy lin l?c v?i chuyn gia ch?m [place] s?c kh?e n?u:  Qu v? b? ?au d? d?i h?n.  Qu v? b? s?t.  Qu v? khng ?i ??i ti?n sau 4 ngy.  Qu v? nn.  Qu v? khng ?i ho?c qu v? b? s?t cn.  Qu v? b? ch?y  mu t? l? gi?a hai mng (h?u mn).  Phn c?a qu v? m?ng, trng nh? bt ch. Yu c?u tr? gip ngay l?p t?c n?u:  Qu v? b? s?t v cc tri?u ch?ng c?a qu v? ??t nhin tr?m tr?ng h?n.  Qu v? sn phn ho?c c mu trong phn.  B?ng c?a qu v? b? ch??ng ln.  Qu v? b? ?au r?t nhi?u ? b?ng.  Qu v? c?m th?y chng m?t ho?c ng?t x?u. Tm t?t  To bn l khi m?t ng??i ?i ??i ti?n t h?n ba l?n trong m?t tu?n, ??i ti?n kh kh?n, ho?c ??i ti?n ra phn (phn) kh, c?ng, ho?c to h?n bnh th??ng.  ?n th?c ?n c nhi?u ch?t x? nh? ??u, ng? c?c nguyn h?t, tri cy t??i v rau c? t??i.  U?ng ?? n??c ?? gi? cho n??c ti?u c mu vng nh?t.  Ch? s? d?ng thu?c khng k ??n v thu?c k ??n theo ch? d?n c?a chuyn gia ch?m [place] s?c kh?e. Thu?c ny  g?m c? b?t k? lo?i th?c ph?m ch?c n?ng c ch?t x? no. Thng tin ny khng nh?m m?c ?ch thay th? cho l?i khuyn m chuyn gia ch?m [place] s?c kh?e ni v?i qu v?. Hy b?o ??m qu v? ph?i th?o lu?n b?t k? v?n ?? g m qu v? c v?i chuyn gia ch?m  s?c kh?e c?a qu v?. Document Revised: 07/10/2019 Document Reviewed: 07/10/2019 Elsevier Patient Education  2021 [employer].

## 2020-10-28 NOTE — Assessment & Plan Note (Signed)
BP at goal with lisinopril BP Readings from Last 3 Encounters:  10/28/20 100/70  07/22/20 114/74  12/26/18 102/68   Maintain medication dose

## 2020-10-28 NOTE — Assessment & Plan Note (Addendum)
Controlled with fasting glucose 95-130. Positive neuropathy: advised about importance foot and skin care. Reports decrease appetite with ozempic No adverse effects with metformin BP at goal LDL at goal  Entered referral to ophthalmology hgbA1c of 7.4% today Maintain medication doses F/up in 69months

## 2020-10-28 NOTE — Progress Notes (Signed)
Subjective:  Patient ID: Shelley Escobar, female    DOB: 04-14-1971  Age: 50 y.o. MRN: [id number]  CC: Follow-up (F/u on diabetes, pt needs refills on Metformin, and Ozempic. )  Declined video interpreter Request for Adult Son to interpret.  Constipation This is a chronic problem. The current episode started more than 1 year ago. The problem has been waxing and waning since onset. Her stool frequency is 1 time per week or less. The stool is described as pellet like and formed. The patient is on a high fiber diet. She exercises regularly. There has been adequate water intake. Associated symptoms include abdominal pain and anorexia. Pertinent negatives include no back pain, bloating, diarrhea, difficulty urinating, fecal incontinence, fever, flatus, hematochezia, hemorrhoids, melena, nausea, rectal pain, vomiting or weight loss. She has tried laxatives for the symptoms. The treatment provided mild relief. Her past medical history is significant for endocrine disease. There is no history of abdominal surgery, neurologic disease or psychiatric history.   DM (diabetes mellitus) ([place]) Controlled with fasting glucose 95-130. Positive neuropathy: advised about importance foot and skin care. Reports decrease appetite with ozempic No adverse effects with metformin BP at goal LDL at goal  Entered referral to ophthalmology hgbA1c of 7.4% today Maintain medication doses F/up in 42months  Essential hypertension BP at goal with lisinopril BP Readings from Last 3 Encounters:  10/28/20 100/70  07/22/20 114/74  12/26/18 102/68   Maintain medication dose  Wt Readings from Last 3 Encounters:  10/28/20 161 lb 9.6 oz (73.3 kg)  07/22/20 162 lb 3.2 oz (73.6 kg)  12/26/18 160 lb (72.6 kg)   Reviewed past Medical, Social and Family history today.  Outpatient Medications Prior to Visit  Medication Sig Dispense Refill  . clotrimazole-betamethasone (LOTRISONE) cream Apply 1 application topically 2 (two)  times daily.    . Continuous Blood Gluc Receiver (FREESTYLE LIBRE READER) DEVI Inject 1 Device into the skin See admin instructions. 1 Device 0  . Continuous Blood Gluc Sensor (FREESTYLE LIBRE 14 DAY SENSOR) MISC APPLY AS DIRECTED EVERY 14 DAYS 6 each 1  . estradiol (ESTRACE) 0.1 MG/GM vaginal cream Place 1 Applicatorful vaginally at bedtime.    Marland Kitchen gemfibrozil (LOPID) 600 MG tablet TAKE 1 TABLET(600 MG) BY MOUTH TWICE DAILY BEFORE A MEAL 180 tablet 3  . Insulin Pen Needle (B-D UF III MINI PEN NEEDLES) 31G X 5 MM MISC USE DAILY AS DIRECTED WITH INSULIN 100 each 3  . lidocaine (XYLOCAINE) 5 % ointment Apply 1 application topically 4 (four) times daily as needed. 30 g 0  . lisinopril (PRINIVIL,ZESTRIL) 2.5 MG tablet Take 1 tablet (2.5 mg total) by mouth daily. 90 tablet 1  . metFORMIN (GLUCOPHAGE) 850 MG tablet Take 1 tablet (850 mg total) by mouth 2 (two) times daily with a meal. 180 tablet 12  . valACYclovir (VALTREX) 500 MG tablet Take 500 mg by mouth 2 (two) times daily.    . Semaglutide,0.25 or 0.5MG /DOS, (OZEMPIC, 0.25 OR 0.5 MG/DOSE,) 2 MG/1.5ML SOPN Inject 0.5 mg into the skin once a week. 1.5 mL 5   No facility-administered medications prior to visit.   ROS See HPI  Objective:  BP 100/70 (BP Location: Left Arm, Patient Position: Sitting, Cuff Size: Normal)   Pulse 88   Temp 97.6 F (36.4 C) (Temporal)   Wt 161 lb 9.6 oz (73.3 kg)   LMP 10/15/2018 (LMP Unknown)   SpO2 98%   BMI 29.56 kg/m   Physical Exam Cardiovascular:     Rate  and Rhythm: Normal rate and regular rhythm.     Pulses: Normal pulses.          Dorsalis pedis pulses are 2+ on the right side and 2+ on the left side.       Posterior tibial pulses are 2+ on the right side and 2+ on the left side.     Heart sounds: Normal heart sounds.  Pulmonary:     Effort: Pulmonary effort is normal.     Breath sounds: Normal breath sounds.  Abdominal:     General: Bowel sounds are normal. There is no distension.      Palpations: Abdomen is soft. There is no mass.     Tenderness: There is no abdominal tenderness.  Musculoskeletal:     Right lower leg: No edema.     Left lower leg: No edema.     Right foot: Normal range of motion. No bunion or prominent metatarsal heads.     Left foot: Normal range of motion. No bunion or prominent metatarsal heads.  Feet:     Right foot:     Protective Sensation: 7 sites tested. 2 sites sensed.     Skin integrity: Callus and dry skin present. No skin breakdown, erythema or fissure.     Toenail Condition: Right toenails are normal.     Left foot:     Protective Sensation: 7 sites tested. 2 sites sensed.     Skin integrity: Callus and dry skin present. No skin breakdown, erythema or fissure.     Toenail Condition: Left toenails are normal.  Neurological:     Mental Status: [name], place, and time.  Psychiatric:        Mood and Affect: Mood normal.        Behavior: Behavior normal.        Thought Content: Thought content normal.     Assessment & Plan:  This visit occurred during the SARS-CoV-2 public health emergency.  Safety protocols were in place, including screening questions prior to the visit, additional usage of staff PPE, and extensive cleaning of exam room while observing appropriate contact time as indicated for disinfecting solutions.   Shelley Escobar was seen today for follow-up.  Diagnoses and all orders for this visit:  Chronic constipation -     POCT glycosylated hemoglobin (Hb A1C)  Type 2 diabetes mellitus with diabetic polyneuropathy, with long-term current use of insulin (HCC) -     lubiprostone (AMITIZA) 8 MCG capsule; Take 1 capsule (8 mcg total) by mouth 2 (two) times daily with a meal. -     Ambulatory referral to Ophthalmology  Encounter for screening mammogram for malignant neoplasm of breast -     MM 3D SCREEN BREAST BILATERAL; Future  Type 2 diabetes mellitus with hyperglycemia, without long-term current use  of insulin (HCC) -     Semaglutide,0.25 or 0.5MG /DOS, (OZEMPIC, 0.25 OR 0.5 MG/DOSE,) 2 MG/1.5ML SOPN; Inject 0.5 mg into the skin once a week.  Essential hypertension    Problem List Items Addressed This Visit      Cardiovascular and Mediastinum   Essential hypertension    BP at goal with lisinopril BP Readings from Last 3 Encounters:  10/28/20 100/70  07/22/20 114/74  12/26/18 102/68   Maintain medication dose        Digestive   Chronic constipation - Primary   Relevant Orders   POCT glycosylated hemoglobin (Hb A1C) (Completed)     Endocrine  DM (diabetes mellitus) ([place])    Controlled with fasting glucose 95-130. Positive neuropathy: advised about importance foot and skin care. Reports decrease appetite with ozempic No adverse effects with metformin BP at goal LDL at goal  Entered referral to ophthalmology hgbA1c of 7.4% today Maintain medication doses F/up in 40months      Relevant Medications   lubiprostone (AMITIZA) 8 MCG capsule   Semaglutide,0.25 or 0.5MG /DOS, (OZEMPIC, 0.25 OR 0.5 MG/DOSE,) 2 MG/1.5ML SOPN   Other Relevant Orders   Ambulatory referral to Ophthalmology    Other Visit Diagnoses    Encounter for screening mammogram for malignant neoplasm of breast       Relevant Orders   MM 3D SCREEN BREAST BILATERAL      Follow-up: Return in about 6 months (around 04/30/2021) for DM and HTN, hyperlipidemia (fasting).  Wilfred Lacy, NP

## 2020-10-31 ENCOUNTER — Encounter: Payer: Self-pay | Admitting: Nurse Practitioner

## 2020-11-30 ENCOUNTER — Other Ambulatory Visit: Payer: Self-pay | Admitting: Nurse Practitioner

## 2020-11-30 DIAGNOSIS — Z1231 Encounter for screening mammogram for malignant neoplasm of breast: Secondary | ICD-10-CM

## 2020-12-03 ENCOUNTER — Telehealth: Payer: Self-pay | Admitting: Nurse Practitioner

## 2020-12-04 NOTE — Telephone Encounter (Signed)
Need list of medication on formulary to replace amitiza

## 2020-12-05 ENCOUNTER — Ambulatory Visit
Admission: RE | Admit: 2020-12-05 | Discharge: 2020-12-05 | Disposition: A | Discharge status: Home / Self Care | Payer: 59 | Source: Ambulatory Visit

## 2020-12-05 DIAGNOSIS — Z1231 Encounter for screening mammogram for malignant neoplasm of breast: Secondary | ICD-10-CM

## 2020-12-08 ENCOUNTER — Ambulatory Visit (INDEPENDENT_AMBULATORY_CARE_PROVIDER_SITE_OTHER)
Admission: RE | Admit: 2020-12-08 | Discharge: 2020-12-08 | Disposition: A | Discharge status: Home / Self Care | Payer: 59 | Source: Ambulatory Visit | Attending: Nurse Practitioner | Admitting: Nurse Practitioner

## 2020-12-08 ENCOUNTER — Ambulatory Visit (INDEPENDENT_AMBULATORY_CARE_PROVIDER_SITE_OTHER): Payer: 59 | Admitting: Nurse Practitioner

## 2020-12-08 ENCOUNTER — Encounter: Payer: Self-pay | Admitting: Nurse Practitioner

## 2020-12-08 ENCOUNTER — Other Ambulatory Visit: Payer: Self-pay

## 2020-12-08 VITALS — BP 130/68 | HR 92 | Temp 98.8°F | Ht 62.0 in | Wt 162.8 lb

## 2020-12-08 DIAGNOSIS — K5909 Other constipation: Secondary | ICD-10-CM

## 2020-12-08 DIAGNOSIS — M545 Low back pain, unspecified: Secondary | ICD-10-CM

## 2020-12-08 DIAGNOSIS — G8929 Other chronic pain: Secondary | ICD-10-CM | POA: Diagnosis not present

## 2020-12-08 MED ORDER — MELOXICAM 7.5 MG PO TABS: 7.5000 mg | ORAL_TABLET | Freq: Every day | ORAL | 5 refills | Status: DC

## 2020-12-08 MED ORDER — LIDOCAINE 5 % EX PTCH: 1.0000 | MEDICATED_PATCH | CUTANEOUS | 0 refills | Status: DC

## 2020-12-08 NOTE — Patient Instructions (Signed)
Go to [address] for x-ray Start meloxicam and lidocaine patch as prescribed.  Cc bi t?p th? d?c cho l?ng Back Exercises Nh?ng bi t?p th? d?c sau ?y lm t?ng s?c m?nh c?a cc c? gip nng ?? thn v l?ng. Cc bi t?p ny c?ng gip gi? cho ph?n l?ng d??i linh ho?t. Th?c hi?n nh?ng bi t?p ny c th? gip ng?n ng?a ?au l?ng ho?c gi?m b?t tnh tr?ng ?au hi?n c. N?u qu v? b? ?au ho?c c c?m gic kh ch?u ? l?ng, hy c? g?ng th?c hi?n nh?ng bi t?p ny 2-3 l?n m?i ngy ho?c theo ch? d?n c?a chuyn gia ch?m [place] s?c kh?e. Khi ?au c?i thi?n, hy t?p cc bi t?p ? m?i ngy, nh?ng t?ng s? l?n l?p l?i cc b??c cho m?i bi t?p (th?c hi?n nhi?u l?n l?p l?i h?n). ?? ng?n ng?a b? ti pht ?au l?ng, hy ti?p t?c th?c hi?n cc bi t?p ny m?t l?n m?i ngy ho?c theo ch? d?n c?a chuyn gia ch?m [place] s?c kh?e. T?p chnh xc nh? ch? d?n c?a chuyn gia ch?m  s?c kh?e v ?i?u ch?nh cc bi t?p theo h??ng d?n. Vi?c c?m th?y h?i c?ng c?, ko, th?t ho?c kh ch?u l bnh th??ng khi qu v? th?c hi?n nh?ng bi t?p ny, nh?ng qu v? nn d?ng t?pngay n?u c?m th?y ?au ??t ng?t ho?c c?n ?au tr? nn tr?m tr?ng h?n. Cc bi t?p G?p m?t bn g?i ln ng?c L?p l?i cc b??c ny 3-5 l?n cho m?i chn: N?m ng?a trn gi??ng c?ng ho?c sn nh, hai chn m? r?ng. G?p m?t ??u g?i ln ng?c. Chn cn l?i v?n m? r?ng v ti?p xc v?i sn nh. Dng c? hai tay gi? ??u g?i ho?c ?i ?? gi? ??u g?i ? nguyn v? tr. Ko ??u g?i ln cho ??n khi qu v? c?m th?y h?i c?ng ? l?ng d??i ho?c mng. Gi? cho c? c?ng trong 10-30 giy. T? t? b? ra v du?i th?ng chn. Nghing hng L?p l?i cc b??c ny 5-10 l?n: N?m ng?a trn gi??ng c?ng ho?c sn nh, hai chn m? r?ng. G?p hai ??u g?i sao hai ??u g?i ch? v? pha tr?n nh v hai bn chn b?ng ph?ng trn sn nh. Si?t ch?t cc c? b?ng d??i ?? ?n l?ng d??i c?a qu v? ln sn. ??ng tc ny s? nghing x??ng ch?u c?a qu v? nh? v?y x??ng c?t s? h??ng ln v? pha tr?n nh thay v h??ng v? pha bn chn v  sn nh. Trong khi h?i c?ng ng??i v th? ??u, gi? t? th? ny trong 5-10 giy. Mo-b L?p l?i cc b??c ny cho ??n khi l?ng d??i c?a qu v? tr? nn linh ho?t h?n: H? ng??i ? t? th? bn tay v ??u g?i trn m?t b? m?t ch?c ch?n. Gi? hai bn tay ? bn d??i hai vai v gi? cho ??u g?i bn d??i hng. Qu v? c th? ??t m?t t?m ??m ? d??i ??u g?i cho tho?i mi. Ci ??u xu?ng v? pha ng?c. Co c? b?ng v ch? x??ng c?t v? pha sn nh nh? v?y l?ng d??i c?a qu v? ???c u?n cong gi?ng nh? l?ng c?a m?t con mo. Gi? nguyn t? th? ny trong 5 giy. T? t? nng ??u ln, ?? c? b?ng th? l?ng v ch? x??ng c?t ln v? pha tr?n nh ?? l?ng c?a qu v? t?o thnh m?t ???ng cong vng gi?ng nh? l?ng c?a m?t con b. Gi? nguyn t? th? ny trong  5 giy.  Ch?ng ??y L?p l?i cc b??c ny 5-10 l?n: N?m s?p (p m?t) trn sn. ??t hai lng bn tay g?n ??u, kho?ng cch b?ng vai. Gi? l?ng cng th? l?ng cng t?t v gi? hng ? trn sn nh, t? t? du?i th?ng hai cnh tay ?? nng n?a trn c?a c? th? v nh?c hai vai ln. Khng s? d?ng cc c? l?ng ?? nng ph?n trn c? th?. Qu v? c th? ?i?u ch?nh ch? ??t bn tay sao cho tho?i mi h?n. Gi? ? t? th? ny trong 5 giy trong khi th? l?ng l?ng. T? t? quay tr? v? t? th? n?m th?ng trn sn nh.  B?c c?u L?p l?i cc b??c ny 10 l?n: N?m ng?a trn m?t b? m?t c?ng. G?p hai ??u g?i sao hai ??u g?i ch? v? pha tr?n nh v hai bn chn b?ng ph?ng trn sn nh. Cnh tay c?a qu v? ph?i du?i b?ng ph?ng ? hai bn, bn c?nh c? th? qu v?. Si?t ch?t c? mng v nng mng ln kh?i sn nh cho ??n khi th?t l?ng ? ?? cao g?n b?ng ??u g?i. Qu v? c?n ph?i c?m th?y cc c? ?ang lm vi?c ? mng v pha sau ?i. N?u qu v? khng c?m th?y nh?ng c? ny, hy tr??t hai bn chn ra cch mng 1-2 inch. Gi? nguyn t? th? ny trong 3-5 giy. T? t? h? th?p hng v? t? th? b?t ??u v ?? c? mng hon ton th? l?ng. N?u bi t?p ny qu d?, th? lm v?i hai tay b?t cho qua ng?c. G?p b?ng L?p l?i cc b??c ny 5-10 l?n: N?m  ng?a trn gi??ng c?ng ho?c sn nh, hai chn m? r?ng. G?p hai ??u g?i sao hai ??u g?i ch? v? pha tr?n nh v hai bn chn b?ng ph?ng trn sn nh. B?t cho hai tay qua ng?c. ??y c?m nh? v? pha ng?c m khng g?p c?. Si?t ch?t c? b?ng v t? t? nng thn ln (thn trn) ?? cao ?? nng hai b? vai kh?i sn nh m?t cht. Trnh nng thn trn cao h?n m?c ny b?i v n c th? t?o qu nhi?u p l?c ln l?ng d??i v khng gip t?ng c??ng s?c m?nh c?a c? b?ng. T? t? tr? v? t? th? b?t ??u. Nng l?ng L?p l?i cc b??c ny 5-10 l?n: N?m s?p (p m?t), hai tay du?i th?ng ? bn v trn t ln sn nh. Si?t c? ? chn v mng. T? t? nng ng?c ln kh?i sn nh trong khi gi? hng ?n xu?ng sn. Gi? ph?n sau ??u cong theo ???ng cong ? l?ng. Hai m?t ph?i nhn vo sn nh. Gi? nguyn t? th? ny trong 3-5 giy. T? t? tr? v? t? th? b?t ??u. Hy lin l?c v?i chuyn gia ch?m [place] s?c kh?e n?u: Tnh tr?ng ?au ho?c c?m gic kh ch?u ? l?ng qu v? tr? nn tr?m tr?ng h?n nhi?u khi qu v? t?p th? d?c. Tnh tr?ng ?au ho?c c?m gic kh ch?u tr?m tr?ng h?n ? l?ng qu v? khng gi?m trong vng 2 gi? sau khi t?p th? d?c. N?u qu v? c b?t k? v?n ?? no trong cc v?n ?? ny, hy ng?ng t?p nh?ng bi t?p ny ngay. Khng t?p l?i cc bi t?p ? tr? khi chuyn gia ch?m [place] s?c kh?e ni qu v? c th?lm nh? v?y. Yu c?u tr? gip ngay l?p t?c n?u: Qu v? b? ?au l?ng r?t nhi?u, ??t ng?t. N?u ?i?u ny x?y ra,  hy ng?ng t?p ngay l?p t?c. Khng t?p l?i cc bi t?p ? tr? khi chuyn gia ch?m [place] s?c kh?e ni qu v? c th? lm nh? v?y. Thng tin ny khng nh?m m?c ?ch thay th? cho l?i khuyn m chuyn gia ch?m [place] s?c kh?e ni v?i qu v?. Hy b?o ??m qu v? ph?i th?o lu?n b?t k? v?n ?? gm qu v? c v?i chuyn gia ch?m [place] s?c kh?e c?a qu v?. Document Revised: 04/04/2018 Document Reviewed: 04/04/2018 Elsevier Patient Education  2022 [employer].

## 2020-12-08 NOTE — Assessment & Plan Note (Signed)
Unable to afford amitiza. Resolved constipation. Has BM every 1-2days, soft. Denies needs for stool softner at this time

## 2020-12-08 NOTE — Progress Notes (Signed)
Subjective:  Patient ID: Shelley Escobar, female    DOB: Jan 24, 1971  Age: 50 y.o. MRN: [id number]  CC: Back Pain (Lower back pain without recent injury)   Back Pain  Chronic constipation Unable to afford amitiza. Resolved constipation. Has BM every 1-2days, soft. Denies needs for stool softner at this time  Chronic midline low back pain without sciatica Chronic, worsening, worse in supine position and in AM. Denies any fever, night sweats or weight loss. Denies any recent fall or injury Minimal improvement with tylenol. MRI thoracic spine 06/25/2019: No acute osseous abnormality in the thoracic spine. 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 No thoracic spinal cord compression or signal abnormality. Mild to moderate T10 neural foraminal stenosis related to posterior element degeneration greater on the right.  Start meloxicam and lidocaine patch repeat lumbar x-ray Repeat for PT if no compression fracture Repeat X-ray lu  Reviewed past Medical, Social and Family history today.  Outpatient Medications Prior to Visit  Medication Sig Dispense Refill   clotrimazole-betamethasone (LOTRISONE) cream Apply 1 application topically 2 (two) times daily.     Continuous Blood Gluc Receiver (FREESTYLE LIBRE READER) DEVI Inject 1 Device into the skin See admin instructions. 1 Device 0   Continuous Blood Gluc Sensor (FREESTYLE LIBRE 14 DAY SENSOR) MISC APPLY AS DIRECTED EVERY 14 DAYS 6 each 1   estradiol (ESTRACE) 0.1 MG/GM vaginal cream Place 1 Applicatorful vaginally at bedtime.     gemfibrozil (LOPID) 600 MG tablet TAKE 1 TABLET(600 MG) BY MOUTH TWICE DAILY BEFORE A MEAL 180 tablet 3   Insulin Pen Needle (B-D UF III MINI PEN NEEDLES) 31G X 5 MM MISC USE DAILY AS DIRECTED WITH INSULIN 100 each 3   lisinopril (PRINIVIL,ZESTRIL) 2.5 MG tablet Take 1 tablet (2.5 mg total) by mouth daily. 90 tablet 1    metFORMIN (GLUCOPHAGE) 850 MG tablet Take 1 tablet (850 mg total) by mouth 2 (two) times daily with a meal. 180 tablet 12   Semaglutide,0.25 or 0.5MG /DOS, (OZEMPIC, 0.25 OR 0.5 MG/DOSE,) 2 MG/1.5ML SOPN Inject 0.5 mg into the skin once a week. 1.5 mL 5   valACYclovir (VALTREX) 500 MG tablet Take 500 mg by mouth 2 (two) times daily.     lidocaine (XYLOCAINE) 5 % ointment Apply 1 application topically 4 (four) times daily as needed. 30 g 0   lubiprostone (AMITIZA) 8 MCG capsule Take 1 capsule (8 mcg total) by mouth 2 (two) times daily with a meal. 60 capsule 5   No facility-administered medications prior to visit.    ROS See HPI  Objective:  BP 130/68   Pulse 92   Temp 98.8 F (37.1 C)   Ht 5\' 2"  (1.575 m)   Wt 162 lb 12.8 oz (73.8 kg)   LMP 10/15/2018 (LMP Unknown)   SpO2 96%   BMI 29.78 kg/m   Physical Exam Cardiovascular:     Rate and Rhythm: Normal rate.     Pulses: Normal pulses.  Pulmonary:     Effort: Pulmonary effort is normal.  Musculoskeletal:        General: Tenderness present.     Thoracic back: Tenderness and bony tenderness present. Decreased range of motion.     Lumbar back: Tenderness and bony tenderness present. Decreased range of motion. Negative right straight leg raise test and negative left straight leg raise test.     Right lower leg: No edema.     Left  lower leg: No edema.     Comments: Tenderness and palpation nodule between T12 and L1. Unable to flex forward beyond 90degrees, kyphosis at thlower thoracic spine.  Neurological:     Mental Status: [name], place, and time.    Assessment & Plan:  This visit occurred during the SARS-CoV-2 public health emergency.  Safety protocols were in place, including screening questions prior to the visit, additional usage of staff PPE, and extensive cleaning of exam room while observing appropriate contact time as indicated for disinfecting solutions.   [name] was seen today for back  pain.  Diagnoses and all orders for this visit:  Chronic midline low back pain without sciatica -     DG Lumbar Spine Complete; Future -     meloxicam (MOBIC) 7.5 MG tablet; Take 1 tablet (7.5 mg total) by mouth daily. With food -     lidocaine (LIDODERM) 5 %; Place 1 patch onto the skin daily. Remove & Discard patch within 12 hours or as directed by MD  Chronic constipation   Problem List Items Addressed This Visit       Digestive   Chronic constipation    Unable to afford amitiza. Resolved constipation. Has BM every 1-2days, soft. Denies needs for stool softner at this time         Other   Chronic midline low back pain without sciatica - Primary    Chronic, worsening, worse in supine position and in AM. Denies any fever, night sweats or weight loss. Denies any recent fall or injury Minimal improvement with tylenol. MRI thoracic spine 06/25/2019: No acute osseous abnormality in the thoracic spine. 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 No thoracic spinal cord compression or signal abnormality. Mild to moderate T10 neural foraminal stenosis related to posterior element degeneration greater on the right.  Start meloxicam and lidocaine patch repeat lumbar x-ray Repeat for PT if no compression fracture Repeat X-ray lu       Relevant Medications   meloxicam (MOBIC) 7.5 MG tablet   lidocaine (LIDODERM) 5 %   Other Relevant Orders   DG Lumbar Spine Complete    Follow-up: No follow-ups on file.  Wilfred Lacy, NP

## 2020-12-08 NOTE — Assessment & Plan Note (Signed)
Chronic, worsening, worse in supine position and in AM. Denies any fever, night sweats or weight loss. Denies any recent fall or injury Minimal improvement with tylenol. MRI thoracic spine 06/25/2019: No acute osseous abnormality in the thoracic spine. 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 No thoracic spinal cord compression or signal abnormality. Mild to moderate T10 neural foraminal stenosis related to posterior element degeneration greater on the right.  Start meloxicam and lidocaine patch repeat lumbar x-ray Repeat for PT if no compression fracture Repeat X-ray lu

## 2020-12-10 ENCOUNTER — Other Ambulatory Visit: Payer: Self-pay | Admitting: Nurse Practitioner

## 2020-12-10 DIAGNOSIS — M546 Pain in thoracic spine: Secondary | ICD-10-CM

## 2020-12-10 DIAGNOSIS — M47819 Spondylosis without myelopathy or radiculopathy, site unspecified: Secondary | ICD-10-CM

## 2020-12-10 DIAGNOSIS — G8929 Other chronic pain: Secondary | ICD-10-CM

## 2020-12-10 DIAGNOSIS — M545 Low back pain, unspecified: Secondary | ICD-10-CM

## 2020-12-11 ENCOUNTER — Telehealth: Payer: Self-pay

## 2020-12-11 NOTE — Telephone Encounter (Signed)
I spoke to Mr. Shelley Escobar:   Mr. Shelley Escobar will need a PA started for the lidocaine patch. Will you please look into it?  I believe they are also availably OTC. Until its processed.

## 2020-12-16 ENCOUNTER — Telehealth: Payer: Self-pay

## 2020-12-16 NOTE — Telephone Encounter (Signed)
PA for Lidocaine patches submitted through cover my meds and also faxed last OV note to [phone].  Awaiting response.  Dm/cma  (Key: [id number])

## 2020-12-17 NOTE — Telephone Encounter (Signed)
PA denied due to not having diabetic neuropathy, neuralgia, or a failure to one of the following  medications: Lidocaine cream, capsaicin cream.  Dm/cma

## 2021-02-19 ENCOUNTER — Telehealth: Payer: Self-pay | Admitting: Nurse Practitioner

## 2021-02-19 DIAGNOSIS — E1165 Type 2 diabetes mellitus with hyperglycemia: Secondary | ICD-10-CM

## 2021-02-19 NOTE — Telephone Encounter (Signed)
Pt's is unable to get Semaglutide,0.25 or 0.'5MG'$ /DOS, (OZEMPIC, 0.25 OR 0.5 MG/DOSE,) 2 MG/1.5ML SOPN [id number] all the [facility] are on back order. She is wondering if she should go without it or get a script similar to it. Please advise pt's son [name] at [phone].

## 2021-02-22 MED ORDER — TIRZEPATIDE 2.5 MG/0.5ML ~~LOC~~ SOAJ: 2.5000 mg | SUBCUTANEOUS | 1 refills | Status: DC

## 2021-02-25 NOTE — Telephone Encounter (Signed)
Spoke with patient son and he states pharmacy called and informed them that Ozempic was in stock and they got that medication before realizing another one was called in. I informed patient to keep ozempic and disregard other medication.

## 2021-05-04 ENCOUNTER — Ambulatory Visit: Payer: 59 | Admitting: Nurse Practitioner

## 2021-05-05 ENCOUNTER — Ambulatory Visit (INDEPENDENT_AMBULATORY_CARE_PROVIDER_SITE_OTHER): Payer: 59 | Admitting: Nurse Practitioner

## 2021-05-05 ENCOUNTER — Encounter: Payer: Self-pay | Admitting: Nurse Practitioner

## 2021-05-05 ENCOUNTER — Ambulatory Visit: Payer: 59 | Admitting: Nurse Practitioner

## 2021-05-05 ENCOUNTER — Other Ambulatory Visit: Payer: Self-pay

## 2021-05-05 VITALS — BP 100/60 | HR 84 | Temp 97.3°F | Wt 162.4 lb

## 2021-05-05 DIAGNOSIS — I10 Essential (primary) hypertension: Secondary | ICD-10-CM

## 2021-05-05 DIAGNOSIS — D509 Iron deficiency anemia, unspecified: Secondary | ICD-10-CM

## 2021-05-05 DIAGNOSIS — M62838 Other muscle spasm: Secondary | ICD-10-CM

## 2021-05-05 DIAGNOSIS — Z23 Encounter for immunization: Secondary | ICD-10-CM | POA: Diagnosis not present

## 2021-05-05 DIAGNOSIS — E785 Hyperlipidemia, unspecified: Secondary | ICD-10-CM | POA: Diagnosis not present

## 2021-05-05 DIAGNOSIS — G8929 Other chronic pain: Secondary | ICD-10-CM

## 2021-05-05 DIAGNOSIS — E1165 Type 2 diabetes mellitus with hyperglycemia: Secondary | ICD-10-CM

## 2021-05-05 DIAGNOSIS — L304 Erythema intertrigo: Secondary | ICD-10-CM

## 2021-05-05 DIAGNOSIS — M545 Low back pain, unspecified: Secondary | ICD-10-CM | POA: Diagnosis not present

## 2021-05-05 LAB — HEPATIC FUNCTION PANEL
ALT: 24 U/L (ref 0–35)
AST: 22 U/L (ref 0–37)
Albumin: 4.4 g/dL (ref 3.5–5.2)
Alkaline Phosphatase: 53 U/L (ref 39–117)
Bilirubin, Direct: 0.1 mg/dL (ref 0.0–0.3)
Total Bilirubin: 0.6 mg/dL (ref 0.2–1.2)
Total Protein: 7.6 g/dL (ref 6.0–8.3)

## 2021-05-05 LAB — BASIC METABOLIC PANEL
BUN: 16 mg/dL (ref 6–23)
CO2: 27 mEq/L (ref 19–32)
Calcium: 9.8 mg/dL (ref 8.4–10.5)
Chloride: 101 mEq/L (ref 96–112)
Creatinine, Ser: 0.67 mg/dL (ref 0.40–1.20)
GFR: 100.25 mL/min (ref >60.00)
Glucose, Bld: 136 mg/dL — ABNORMAL HIGH (ref 70–99)
Potassium: 4.1 mEq/L (ref 3.5–5.1)
Sodium: 137 mEq/L (ref 135–145)

## 2021-05-05 LAB — LIPID PANEL
Cholesterol: 130 mg/dL (ref 0–200)
HDL: 39.4 mg/dL (ref >39.00)
LDL Cholesterol: 66 mg/dL (ref 0–99)
NonHDL: 91.06
Total CHOL/HDL Ratio: 3
Triglycerides: 127 mg/dL (ref 0.0–149.0)
VLDL: 25.4 mg/dL (ref 0.0–40.0)

## 2021-05-05 LAB — HEMOGLOBIN A1C: Hgb A1c MFr Bld: 6.8 % — ABNORMAL HIGH (ref 4.6–6.5)

## 2021-05-05 MED ORDER — MELOXICAM 7.5 MG PO TABS: 7.5000 mg | ORAL_TABLET | Freq: Every day | ORAL | 5 refills | Status: AC

## 2021-05-05 MED ORDER — CLOTRIMAZOLE-BETAMETHASONE 1-0.05 % EX CREA: 1.0000 "application " | TOPICAL_CREAM | Freq: Two times a day (BID) | CUTANEOUS | 1 refills | Status: AC

## 2021-05-05 NOTE — Assessment & Plan Note (Addendum)
S/p hysterectomy Colonoscopy completed 10/2019 by Dr. Dorrene German [facility] "duodenal and stomach biopsies were normal. I told him the polyps removed were benign, but precancerous polyps called adenomatous colon polyps. I recommend that she have her colon checked again in three years for colorectal cancer preventative reason"  Repeat cbc and iron today

## 2021-05-05 NOTE — Patient Instructions (Addendum)
Call [facility] care for appt: [phone]. You need a diabetic eye exam once a year.  Start strength training exercise in combination to your cardio exercise.  Need to check glucose at least 2x/week in Am (before breakfast) Call office with name of strip needed  Go to lab for blood draw.  Shoulder Exercises Ask your health care provider which exercises are safe for you. Do exercises exactly as told by your health care provider and adjust them as directed. It is normal to feel mild stretching, pulling, tightness, or discomfort as you do these exercises. Stop right away if you feel sudden pain or your pain gets worse. Do not begin these exercises until told by your health care provider. Stretching exercises External rotation and abduction This exercise is sometimes called corner stretch. This exercise rotates your arm outward (external rotation) and moves your arm out from your body (abduction). Stand in a doorway with one of your feet slightly in front of the other. This is called a staggered stance. If you cannot reach your forearms to the door frame, stand facing a corner of a room. Choose one of the following positions as told by your health care provider: Place your hands and forearms on the door frame above your head. Place your hands and forearms on the door frame at the height of your head. Place your hands on the door frame at the height of your elbows. Slowly move your weight onto your front foot until you feel a stretch across your chest and in the front of your shoulders. Keep your head and chest upright and keep your abdominal muscles tight. Hold for _____5_____ seconds. To release the stretch, shift your weight to your back foot. Repeat ____5_____ times. Complete this exercise _____2_____ times a day. Extension, standing Stand and hold a broomstick, a cane, or a similar object behind your back. Your hands should be a little wider than shoulder width apart. Your palms should face  away from your back. Keeping your elbows straight and your shoulder muscles relaxed, move the stick away from your body until you feel a stretch in your shoulders (extension). Avoid shrugging your shoulders while you move the stick. Keep your shoulder blades tucked down toward the middle of your back. Hold for __________ seconds. Slowly return to the starting position. Repeat __________ times. Complete this exercise __________ times a day. Range-of-motion exercises Pendulum  Stand near a wall or a surface that you can hold onto for balance. Bend at the waist and let your left / right arm hang straight down. Use your other arm to support you. Keep your back straight and do not lock your knees. Relax your left / right arm and shoulder muscles, and move your hips and your trunk so your left / right arm swings freely. Your arm should swing because of the motion of your body, not because you are using your arm or shoulder muscles. Keep moving your hips and trunk so your arm swings in the following directions, as told by your health care provider: Side to side. Forward and backward. In clockwise and counterclockwise circles. Continue each motion for __________ seconds, or for as long as told by your health care provider. Slowly return to the starting position. Repeat __________ times. Complete this exercise __________ times a day. Shoulder flexion, standing  Stand and hold a broomstick, a cane, or a similar object. Place your hands a little more than shoulder width apart on the object. Your left / right hand should be  palm up, and your other hand should be palm down. Keep your elbow straight and your shoulder muscles relaxed. Push the stick up with your healthy arm to raise your left / right arm in front of your body, and then over your head until you feel a stretch in your shoulder (flexion). Avoid shrugging your shoulder while you raise your arm. Keep your shoulder blade tucked down toward the  middle of your back. Hold for __________ seconds. Slowly return to the starting position. Repeat __________ times. Complete this exercise __________ times a day. Shoulder abduction, standing Stand and hold a broomstick, a cane, or a similar object. Place your hands a little more than shoulder width apart on the object. Your left / right hand should be palm up, and your other hand should be palm down. Keep your elbow straight and your shoulder muscles relaxed. Push the object across your body toward your left / right side. Raise your left / right arm to the side of your body (abduction) until you feel a stretch in your shoulder. Do not raise your arm above shoulder height unless your health care provider tells you to do that. If directed, raise your arm over your head. Avoid shrugging your shoulder while you raise your arm. Keep your shoulder blade tucked down toward the middle of your back. Hold for __________ seconds. Slowly return to the starting position. Repeat __________ times. Complete this exercise __________ times a day. Internal rotation  Place your left / right hand behind your back, palm up. Use your other hand to dangle an exercise band, a towel, or a similar object over your shoulder. Grasp the band with your left / right hand so you are holding on to both ends. Gently pull up on the band until you feel a stretch in the front of your left / right shoulder. The movement of your arm toward the center of your body is called internal rotation. Avoid shrugging your shoulder while you raise your arm. Keep your shoulder blade tucked down toward the middle of your back. Hold for __________ seconds. Release the stretch by letting go of the band and lowering your hands. Repeat __________ times. Complete this exercise __________ times a day. Strengthening exercises External rotation  Sit in a stable chair without armrests. Secure an exercise band to a stable object at elbow height on your  left / right side. Place a soft object, such as a folded towel or a small pillow, between your left / right upper arm and your body to move your elbow about 4 inches (10 cm) away from your side. Hold the end of the exercise band so it is tight and there is no slack. Keeping your elbow pressed against the soft object, slowly move your forearm out, away from your abdomen (external rotation). Keep your body steady so only your forearm moves. Hold for __________ seconds. Slowly return to the starting position. Repeat __________ times. Complete this exercise __________ times a day. Shoulder abduction  Sit in a stable chair without armrests, or stand up. Hold a __________ weight in your left / right hand, or hold an exercise band with both hands. Start with your arms straight down and your left / right palm facing in, toward your body. Slowly lift your left / right hand out to your side (abduction). Do not lift your hand above shoulder height unless your health care provider tells you that this is safe. Keep your arms straight. Avoid shrugging your shoulder while you do  this movement. Keep your shoulder blade tucked down toward the middle of your back. Hold for __________ seconds. Slowly lower your arm, and return to the starting position. Repeat __________ times. Complete this exercise __________ times a day. Shoulder extension Sit in a stable chair without armrests, or stand up. Secure an exercise band to a stable object in front of you so it is at shoulder height. Hold one end of the exercise band in each hand. Your palms should face each other. Straighten your elbows and lift your hands up to shoulder height. Step back, away from the secured end of the exercise band, until the band is tight and there is no slack. Squeeze your shoulder blades together as you pull your hands down to the sides of your thighs (extension). Stop when your hands are straight down by your sides. Do not let your hands  go behind your body. Hold for __________ seconds. Slowly return to the starting position. Repeat __________ times. Complete this exercise __________ times a day. Shoulder row Sit in a stable chair without armrests, or stand up. Secure an exercise band to a stable object in front of you so it is at waist height. Hold one end of the exercise band in each hand. Position your palms so that your thumbs are facing the ceiling (neutral position). Bend each of your elbows to a 90-degree angle (right angle) and keep your upper arms at your sides. Step back until the band is tight and there is no slack. Slowly pull your elbows back behind you. Hold for __________ seconds. Slowly return to the starting position. Repeat __________ times. Complete this exercise __________ times a day. Shoulder press-ups  Sit in a stable chair that has armrests. Sit upright, with your feet flat on the floor. Put your hands on the armrests so your elbows are bent and your fingers are pointing forward. Your hands should be about even with the sides of your body. Push down on the armrests and use your arms to lift yourself off the chair. Straighten your elbows and lift yourself up as much as you comfortably can. Move your shoulder blades down, and avoid letting your shoulders move up toward your ears. Keep your feet on the ground. As you get stronger, your feet should support less of your body weight as you lift yourself up. Hold for __________ seconds. Slowly lower yourself back into the chair. Repeat __________ times. Complete this exercise __________ times a day. Wall push-ups  Stand so you are facing a stable wall. Your feet should be about one arm-length away from the wall. Lean forward and place your palms on the wall at shoulder height. Keep your feet flat on the floor as you bend your elbows and lean forward toward the wall. Hold for __________ seconds. Straighten your elbows to push yourself back to the  starting position. Repeat __________ times. Complete this exercise __________ times a day. This information is not intended to replace advice given to you by your health care provider. Make sure you discuss any questions you have with your health care provider. Document Revised: 09/28/2018 Document Reviewed: 07/06/2018 Elsevier Patient Education  2022 [place].  Neck Exercises Ask your health care provider which exercises are safe for you. Do exercises exactly as told by your health care provider and adjust them as directed. It is normal to feel mild stretching, pulling, tightness, or discomfort as you do these exercises. Stop right away if you feel sudden pain or your pain gets worse.  Do not begin these exercises until told by your health care provider. Neck exercises can be important for many reasons. They can improve strength and maintain flexibility in your neck, which will help your upper back and prevent neck pain. Stretching exercises Rotation neck stretching  Sit in a chair or stand up. Place your feet flat on the floor, shoulder-width apart. Slowly turn your head (rotate) to the right until a slight stretch is felt. Turn it all the way to the right so you can look over your right shoulder. Do not tilt or tip your head. Hold this position for 10-30 seconds. Slowly turn your head (rotate) to the left until a slight stretch is felt. Turn it all the way to the left so you can look over your left shoulder. Do not tilt or tip your head. Hold this position for 10-30 seconds. Repeat __________ times. Complete this exercise __________ times a day. Neck retraction  Sit in a sturdy chair or stand up. Look straight ahead. Do not bend your neck. Use your fingers to push your chin backward (retraction). Do not bend your neck for this movement. Continue to face straight ahead. If you are doing the exercise properly, you will feel a slight sensation in your throat and a stretch at the back of  your neck. Hold the stretch for 1-2 seconds. Repeat __________ times. Complete this exercise __________ times a day. Strengthening exercises Neck press  Lie on your back on a firm bed or on the floor with a pillow under your head. Use your neck muscles to push your head down on the pillow and straighten your spine. Hold the position as well as you can. Keep your head facing up (in a neutral position) and your chin tucked. Slowly count to 5 while holding this position. Repeat __________ times. Complete this exercise __________ times a day. Isometrics These are exercises in which you strengthen the muscles in your neck while keeping your neck still (isometrics). Sit in a supportive chair and place your hand on your forehead. Keep your head and face facing straight ahead. Do not flex or extend your neck while doing isometrics. Push forward with your head and neck while pushing back with your hand. Hold for 10 seconds. Do the sequence again, this time putting your hand against the back of your head. Use your head and neck to push backward against the hand pressure. Finally, do the same exercise on either side of your head, pushing sideways against the pressure of your hand. Repeat __________ times. Complete this exercise __________ times a day. Prone head lifts  Lie face-down (prone position), resting on your elbows so that your chest and upper back are raised. Start with your head facing downward, near your chest. Position your chin either on or near your chest. Slowly lift your head upward. Lift until you are looking straight ahead. Then continue lifting your head as far back as you can comfortably stretch. Hold your head up for 5 seconds. Then slowly lower it to your starting position. Repeat __________ times. Complete this exercise __________ times a day. Supine head lifts  Lie on your back (supine position), bending your knees to point to the ceiling and keeping your feet flat on the  floor. Lift your head slowly off the floor, raising your chin toward your chest. Hold for 5 seconds. Repeat __________ times. Complete this exercise __________ times a day. Scapular retraction  Stand with your arms at your sides. Look straight ahead. Slowly pull both  shoulders (scapulae) backward and downward (retraction) until you feel a stretch between your shoulder blades in your upper back. Hold for 10-30 seconds. Relax and repeat. Repeat __________ times. Complete this exercise __________ times a day. Contact a health care provider if: Your neck pain or discomfort gets worse when you do an exercise. Your neck pain or discomfort does not improve within 2 hours after you exercise. If you have any of these problems, stop exercising right away. Do not do the exercises again unless your health care provider says that you can. Get help right away if: You develop sudden, severe neck pain. If this happens, stop exercising right away. Do not do the exercises again unless your health care provider says that you can. This information is not intended to replace advice given to you by your health care provider. Make sure you discuss any questions you have with your health care provider. Document Revised: 12/01/2020 Document Reviewed: 12/01/2020 Elsevier Patient Education  [place].

## 2021-05-05 NOTE — Assessment & Plan Note (Signed)
Improved with use of meloxicam prn. Declined need for outpatient PT Med refill sent

## 2021-05-05 NOTE — Assessment & Plan Note (Addendum)
No glucose check at home. States she is unable to use CGM. She has glucometer at home. Encourage to check glucose at least 2x/week before breakfast. No adverse effects with metformin 850mg  BID and ozempic 0.5mg  weekly. Provided ophthalmology information to schedule eye exam  Check hgbA1c, and bmp today: stable hgbA1c at 6.8% Maintain ozempic dose Decrease metformin dose to 850mg  daily F/up in 34months

## 2021-05-05 NOTE — Progress Notes (Addendum)
Subjective:  Patient ID: Shelley Escobar, female    DOB: [birth year]  Age: 50 y.o. MRN: [id number]  CC: Follow-up (6 month f/u on DM, HTN, and Cholesterol)  HPI Accompanied by son who helps to translate. Ms. Thi declined use of video interpreter.  Chronic midline low back pain without sciatica Improved with use of meloxicam prn. Declined need for outpatient PT Med refill sent  Dyslipidemia Repeat lipid panel today: LDL at goal No statin at this time 79yrs ASCVD risk at 2% Lipid Panel     Component Value Date/Time   CHOL 130 05/05/2021 0937   CHOL 80 (L) 09/19/2017 1050   TRIG 127.0 05/05/2021 0937   HDL 39.40 05/05/2021 0937   HDL 31 (L) 09/19/2017 1050   CHOLHDL 3 05/05/2021 0937   VLDL 25.4 05/05/2021 0937   LDLCALC 66 05/05/2021 0937   LDLCALC 37 09/19/2017 1050   LABVLDL 12 09/19/2017 1050   repeat in 6-37months  DM (diabetes mellitus) ([place]) No glucose check at home. States she is unable to use CGM. She has glucometer at home. Encourage to check glucose at least 2x/week before breakfast. No adverse effects with metformin 850mg  BID and ozempic 0.5mg  weekly. Provided ophthalmology information to schedule eye exam  Check hgbA1c, and bmp today: stable hgbA1c at 6.8% Maintain ozempic dose Decrease metformin dose to 850mg  daily F/up in 84months  IDA (iron deficiency anemia) S/p hysterectomy Colonoscopy completed 10/2019 by Dr. Dorrene German [facility] "duodenal and stomach biopsies were normal. I told him the polyps removed were benign, but precancerous polyps called adenomatous colon polyps. I recommend that she have her colon checked again in three years for colorectal cancer preventative reason"  Repeat cbc and iron today  Essential hypertension BP stable with lisinopril 2.5mg  BP Readings from Last 3 Encounters:  05/05/21 100/60  12/08/20 130/68  10/28/20 100/70   refill sent  Reviewed past Medical, Social and Family history today.  Outpatient Medications Prior  to Visit  Medication Sig Dispense Refill   estradiol (ESTRACE) 0.1 MG/GM vaginal cream Place 1 Applicatorful vaginally at bedtime.     lidocaine (LIDODERM) 5 % Place 1 patch onto the skin daily. Remove & Discard patch within 12 hours or as directed by MD 30 patch 0   valACYclovir (VALTREX) 500 MG tablet Take 500 mg by mouth 2 (two) times daily.     clotrimazole-betamethasone (LOTRISONE) cream Apply 1 application topically 2 (two) times daily.     Continuous Blood Gluc Receiver (FREESTYLE LIBRE READER) DEVI Inject 1 Device into the skin See admin instructions. 1 Device 0   Continuous Blood Gluc Sensor (FREESTYLE LIBRE 14 DAY SENSOR) MISC APPLY AS DIRECTED EVERY 14 DAYS 6 each 1   gemfibrozil (LOPID) 600 MG tablet TAKE 1 TABLET(600 MG) BY MOUTH TWICE DAILY BEFORE A MEAL 180 tablet 3   lisinopril (PRINIVIL,ZESTRIL) 2.5 MG tablet Take 1 tablet (2.5 mg total) by mouth daily. 90 tablet 1   meloxicam (MOBIC) 7.5 MG tablet Take 1 tablet (7.5 mg total) by mouth daily. With food 30 tablet 5   metFORMIN (GLUCOPHAGE) 850 MG tablet Take 1 tablet (850 mg total) by mouth 2 (two) times daily with a meal. 180 tablet 12   Semaglutide (OZEMPIC, 2 MG/DOSE, [place]) Inject 0.5 mg into the skin once a week.     Insulin Pen Needle (B-D UF III MINI PEN NEEDLES) 31G X 5 MM MISC USE DAILY AS DIRECTED WITH INSULIN (Patient not taking: Reported on 05/05/2021) 100 each 3   tirzepatide (  MOUNJARO) 2.5 MG/0.5ML Pen Inject 2.5 mg into the skin once a week. (Patient not taking: Reported on 05/05/2021) 6 mL 1   No facility-administered medications prior to visit.    ROS See HPI  Objective:  BP 100/60 (BP Location: Right Arm, Patient Position: Sitting, Cuff Size: Normal)   Pulse 84   Temp (!) 97.3 F (36.3 C) (Temporal)   Wt 162 lb 6.4 oz (73.7 kg)   LMP 10/15/2018 (LMP Unknown)   SpO2 99%   BMI 29.70 kg/m   Physical Exam Vitals reviewed.  Cardiovascular:     Rate and Rhythm: Normal rate and regular rhythm.     Pulses:  Normal pulses.     Heart sounds: Normal heart sounds.  Pulmonary:     Effort: Pulmonary effort is normal.     Breath sounds: Normal breath sounds.  Musculoskeletal:        General: Normal range of motion.     Right lower leg: No edema.     Left lower leg: No edema.  Skin:    General: Skin is warm and dry.  Neurological:     Mental Status: [name], place, and time.  Psychiatric:        Mood and Affect: Mood normal.        Behavior: Behavior normal.        Thought Content: Thought content normal.    Assessment & Plan:  This visit occurred during the SARS-CoV-2 public health emergency.  Safety protocols were in place, including screening questions prior to the visit, additional usage of staff PPE, and extensive cleaning of exam room while observing appropriate contact time as indicated for disinfecting solutions.   [name] was seen today for follow-up.  Diagnoses and all orders for this visit:  Flu vaccine need -     Flu Vaccine QUAD High Dose(Fluad)  Essential hypertension -     Basic metabolic panel  Type 2 diabetes mellitus with hyperglycemia, without long-term current use of insulin (HCC) -     Basic metabolic panel -     Hemoglobin A1c -     Hepatic function panel -     metFORMIN (GLUCOPHAGE) 850 MG tablet; Take 1 tablet (850 mg total) by mouth daily with breakfast. -     lisinopril (ZESTRIL) 2.5 MG tablet; Take 1 tablet (2.5 mg total) by mouth daily. -     Semaglutide, 2 MG/DOSE, (OZEMPIC, 2 MG/DOSE,) 8 MG/3ML SOPN; Inject 0.5 mg into the skin once a week.  Intertrigo -     clotrimazole-betamethasone (LOTRISONE) cream; Apply 1 application topically 2 (two) times daily.  Chronic midline low back pain without sciatica -     meloxicam (MOBIC) 7.5 MG tablet; Take 1 tablet (7.5 mg total) by mouth daily. With food  Trapezius muscle spasm  Dyslipidemia -     Lipid panel -     Hepatic function panel  Iron deficiency anemia, unspecified iron  deficiency anemia type -     CBC with Differential/Platelet -     IBC + Ferritin  Need for pneumococcal vaccine -     Pneumococcal conjugate vaccine 20-valent (Prevnar 20)  Call [facility] for appt: [phone]. You need a diabetic eye exam once a year. Start strength training and stretching exercise in combination to your cardio exercise.printed information provided. Need to check glucose at least 2x/week in Am (before breakfast) Call office with name of strip needed  Problem List Items Addressed This Visit  Cardiovascular and Mediastinum   Essential hypertension    BP stable with lisinopril 2.5mg  BP Readings from Last 3 Encounters:  05/05/21 100/60  12/08/20 130/68  10/28/20 100/70   refill sent      Relevant Medications   lisinopril (ZESTRIL) 2.5 MG tablet   Other Relevant Orders   Basic metabolic panel (Completed)     Endocrine   DM (diabetes mellitus) ([place])    No glucose check at home. States she is unable to use CGM. She has glucometer at home. Encourage to check glucose at least 2x/week before breakfast. No adverse effects with metformin 850mg  BID and ozempic 0.5mg  weekly. Provided ophthalmology information to schedule eye exam  Check hgbA1c, and bmp today: stable hgbA1c at 6.8% Maintain ozempic dose Decrease metformin dose to 850mg  daily F/up in 82months      Relevant Medications   metFORMIN (GLUCOPHAGE) 850 MG tablet   lisinopril (ZESTRIL) 2.5 MG tablet   Semaglutide, 2 MG/DOSE, (OZEMPIC, 2 MG/DOSE,) 8 MG/3ML SOPN   Other Relevant Orders   Basic metabolic panel (Completed)   Hemoglobin A1c (Completed)   Hepatic function panel (Completed)     Other   Chronic midline low back pain without sciatica    Improved with use of meloxicam prn. Declined need for outpatient PT Med refill sent      Relevant Medications   meloxicam (MOBIC) 7.5 MG tablet   Dyslipidemia    Repeat lipid panel today: LDL at goal No statin at this time 48yrs ASCVD risk at  2% Lipid Panel     Component Value Date/Time   CHOL 130 05/05/2021 0937   CHOL 80 (L) 09/19/2017 1050   TRIG 127.0 05/05/2021 0937   HDL 39.40 05/05/2021 0937   HDL 31 (L) 09/19/2017 1050   CHOLHDL 3 05/05/2021 0937   VLDL 25.4 05/05/2021 0937   LDLCALC 66 05/05/2021 0937   LDLCALC 37 09/19/2017 1050   LABVLDL 12 09/19/2017 1050   repeat in 6-42months      Relevant Orders   Lipid panel (Completed)   Hepatic function panel (Completed)   IDA (iron deficiency anemia)    S/p hysterectomy Colonoscopy completed 10/2019 by Dr. Dorrene German [facility] "duodenal and stomach biopsies were normal. I told him the polyps removed were benign, but precancerous polyps called adenomatous colon polyps. I recommend that she have her colon checked again in three years for colorectal cancer preventative reason"  Repeat cbc and iron today      Relevant Orders   CBC with Differential/Platelet   IBC + Ferritin   Other Visit Diagnoses     Flu vaccine need    -  Primary   Relevant Orders   Flu Vaccine QUAD High Dose(Fluad) (Completed)   Intertrigo       Relevant Medications   clotrimazole-betamethasone (LOTRISONE) cream   Trapezius muscle spasm       Need for pneumococcal vaccine       Relevant Orders   Pneumococcal conjugate vaccine 20-valent (Prevnar 20) (Completed)       Follow-up: Return in about 6 months (around 11/02/2021) for CPE(fasting).  Wilfred Lacy, NP

## 2021-05-05 NOTE — Assessment & Plan Note (Addendum)
Repeat lipid panel today: LDL at goal No statin at this time 55yrs ASCVD risk at 2% Lipid Panel     Component Value Date/Time   CHOL 130 05/05/2021 0937   CHOL 80 (L) 09/19/2017 1050   TRIG 127.0 05/05/2021 0937   HDL 39.40 05/05/2021 0937   HDL 31 (L) 09/19/2017 1050   CHOLHDL 3 05/05/2021 0937   VLDL 25.4 05/05/2021 0937   LDLCALC 66 05/05/2021 0937   LDLCALC 37 09/19/2017 1050   LABVLDL 12 09/19/2017 1050   repeat in 6-92months

## 2021-05-06 MED ORDER — OZEMPIC (2 MG/DOSE) 8 MG/3ML ~~LOC~~ SOPN
0.5000 mg | PEN_INJECTOR | SUBCUTANEOUS | 5 refills | Status: DC
Start: 2021-05-06 — End: 2021-07-12

## 2021-05-06 MED ORDER — LISINOPRIL 2.5 MG PO TABS: 2.5000 mg | ORAL_TABLET | Freq: Every day | ORAL | 3 refills | Status: AC

## 2021-05-06 MED ORDER — METFORMIN HCL 850 MG PO TABS: 850.0000 mg | ORAL_TABLET | Freq: Every day | ORAL | 3 refills | Status: DC

## 2021-05-06 NOTE — Assessment & Plan Note (Signed)
BP stable with lisinopril 2.5mg  BP Readings from Last 3 Encounters:  05/05/21 100/60  12/08/20 130/68  10/28/20 100/70   refill sent

## 2021-05-06 NOTE — Addendum Note (Signed)
Addended by: Wilfred Lacy L on: 05/06/2021 03:28 PM   Modules accepted: Orders

## 2021-05-06 NOTE — Progress Notes (Signed)
Stable lab results with hgbA1c at 6.8%. Dm is controlled. Decrease metformin dose to 850mg  once a day Maintain other medication doses

## 2021-05-28 ENCOUNTER — Ambulatory Visit: Payer: 59 | Admitting: Nurse Practitioner

## 2021-06-17 ENCOUNTER — Encounter: Payer: Self-pay | Admitting: Family

## 2021-07-09 ENCOUNTER — Telehealth: Payer: Self-pay | Admitting: Nurse Practitioner

## 2021-07-09 DIAGNOSIS — E1165 Type 2 diabetes mellitus with hyperglycemia: Secondary | ICD-10-CM

## 2021-07-09 NOTE — Telephone Encounter (Signed)
Patient states she is okay taking the other medication. Pharmacy informed them that it would be the middle of next month before they would get the medication in. Pt son called other pharmacies and was informed medication is on back order and it would be weeks before they would get it as well.

## 2021-07-09 NOTE — Telephone Encounter (Signed)
Pts son called saying the pharmacies are out of [place]... is there something else she can take?

## 2021-07-12 ENCOUNTER — Telehealth: Payer: Self-pay | Admitting: Nurse Practitioner

## 2021-07-12 DIAGNOSIS — E1165 Type 2 diabetes mellitus with hyperglycemia: Secondary | ICD-10-CM

## 2021-07-12 MED ORDER — LIRAGLUTIDE 18 MG/3ML ~~LOC~~ SOPN: 0.6000 mg | PEN_INJECTOR | Freq: Every day | SUBCUTANEOUS | 5 refills | Status: DC

## 2021-07-12 NOTE — Telephone Encounter (Signed)
Pt son states insurance will not cover the Victoza but he thinks it may cover the Trulicity because he went online to see if it would and he thinks it will cover as long as it is a 30 day supply.

## 2021-07-12 NOTE — Telephone Encounter (Signed)
Switched ozempic weekly to victoza daily injection due to medication shortage. Unable to switch to [facility] due to current lose doe of ozempic.

## 2021-07-12 NOTE — Telephone Encounter (Signed)
LVM for son to return call to go over instructions.

## 2021-07-13 ENCOUNTER — Telehealth: Payer: Self-pay | Admitting: Nurse Practitioner

## 2021-07-13 MED ORDER — TRULICITY 0.75 MG/0.5ML ~~LOC~~ SOAJ
0.7500 mg | SUBCUTANEOUS | 0 refills | Status: DC
Start: 2021-07-13 — End: 2021-11-04

## 2021-07-13 NOTE — Telephone Encounter (Signed)
Pt and son notified and verbalized understanding.

## 2021-07-13 NOTE — Addendum Note (Signed)
Addended by: Leana Gamer on: 07/13/2021 08:23 AM   Modules accepted: Orders

## 2021-07-13 NOTE — Telephone Encounter (Signed)
Pt's son is calling in checking on status of Dulaglutide (TRULICITY) 0.47 VV/8.7AJ SOPN [[id number]] . He was informed today this script is going to need a PA. Please advise [name] at [phone].

## 2021-07-14 ENCOUNTER — Encounter: Payer: Self-pay | Admitting: Family

## 2021-07-14 NOTE — Telephone Encounter (Signed)
Pts son calling back about status of pre auth.

## 2021-07-15 ENCOUNTER — Encounter: Payer: Self-pay | Admitting: Nurse Practitioner

## 2021-07-16 NOTE — Telephone Encounter (Signed)
Patient son was informed that PA has been initiated and we are just waiting on a response from insurance. He verbalized understanding.

## 2021-07-17 ENCOUNTER — Other Ambulatory Visit: Payer: Self-pay | Admitting: Nurse Practitioner

## 2021-07-17 ENCOUNTER — Ambulatory Visit: Admit: 2021-07-17 | Discharge status: Absent without leave | Payer: 59

## 2021-07-17 DIAGNOSIS — E1165 Type 2 diabetes mellitus with hyperglycemia: Secondary | ICD-10-CM

## 2021-07-20 NOTE — Telephone Encounter (Signed)
Duplicate request

## 2021-07-23 ENCOUNTER — Ambulatory Visit: Payer: 59 | Admitting: Nurse Practitioner

## 2021-11-04 ENCOUNTER — Ambulatory Visit (INDEPENDENT_AMBULATORY_CARE_PROVIDER_SITE_OTHER): Payer: 59 | Admitting: Nurse Practitioner

## 2021-11-04 ENCOUNTER — Encounter: Payer: Self-pay | Admitting: Nurse Practitioner

## 2021-11-04 ENCOUNTER — Other Ambulatory Visit: Payer: 59

## 2021-11-04 VITALS — BP 104/68 | HR 70 | Temp 97.5°F | Wt 157.8 lb

## 2021-11-04 DIAGNOSIS — M25512 Pain in left shoulder: Secondary | ICD-10-CM | POA: Diagnosis not present

## 2021-11-04 DIAGNOSIS — M65311 Trigger thumb, right thumb: Secondary | ICD-10-CM

## 2021-11-04 DIAGNOSIS — G8929 Other chronic pain: Secondary | ICD-10-CM | POA: Insufficient documentation

## 2021-11-04 DIAGNOSIS — I10 Essential (primary) hypertension: Secondary | ICD-10-CM | POA: Diagnosis not present

## 2021-11-04 DIAGNOSIS — E1165 Type 2 diabetes mellitus with hyperglycemia: Secondary | ICD-10-CM

## 2021-11-04 DIAGNOSIS — E1169 Type 2 diabetes mellitus with other specified complication: Secondary | ICD-10-CM | POA: Diagnosis not present

## 2021-11-04 DIAGNOSIS — E785 Hyperlipidemia, unspecified: Secondary | ICD-10-CM

## 2021-11-04 DIAGNOSIS — M65319 Trigger thumb, unspecified thumb: Secondary | ICD-10-CM | POA: Insufficient documentation

## 2021-11-04 LAB — MICROALBUMIN / CREATININE URINE RATIO
Creatinine,U: 72.3 mg/dL
Microalb Creat Ratio: 1 mg/g (ref 0.0–30.0)
Microalb, Ur: <0.7 mg/dL (ref 0.0–1.9)

## 2021-11-04 LAB — COMPREHENSIVE METABOLIC PANEL
ALT: 36 U/L — ABNORMAL HIGH (ref 0–35)
AST: 25 U/L (ref 0–37)
Albumin: 4.3 g/dL (ref 3.5–5.2)
Alkaline Phosphatase: 62 U/L (ref 39–117)
BUN: 15 mg/dL (ref 6–23)
CO2: 31 mEq/L (ref 19–32)
Calcium: 10 mg/dL (ref 8.4–10.5)
Chloride: 99 mEq/L (ref 96–112)
Creatinine, Ser: 0.73 mg/dL (ref 0.40–1.20)
GFR: 93.99 mL/min (ref >60.00)
Glucose, Bld: 199 mg/dL — ABNORMAL HIGH (ref 70–99)
Potassium: 4.3 mEq/L (ref 3.5–5.1)
Sodium: 136 mEq/L (ref 135–145)
Total Bilirubin: 0.6 mg/dL (ref 0.2–1.2)
Total Protein: 7.8 g/dL (ref 6.0–8.3)

## 2021-11-04 LAB — LIPID PANEL
Cholesterol: 111 mg/dL (ref 0–200)
HDL: 36.2 mg/dL — ABNORMAL LOW (ref >39.00)
LDL Cholesterol: 50 mg/dL (ref 0–99)
NonHDL: 74.58
Total CHOL/HDL Ratio: 3
Triglycerides: 122 mg/dL (ref 0.0–149.0)
VLDL: 24.4 mg/dL (ref 0.0–40.0)

## 2021-11-04 MED ORDER — DICLOFENAC SODIUM 1 % EX GEL: 2.0000 g | Freq: Three times a day (TID) | CUTANEOUS | 0 refills | Status: AC | PRN

## 2021-11-04 MED ORDER — TRULICITY 0.75 MG/0.5ML ~~LOC~~ SOAJ: 0.7500 mg | SUBCUTANEOUS | 0 refills | Status: DC

## 2021-11-04 MED ORDER — METHOCARBAMOL 500 MG PO TABS: 500.0000 mg | ORAL_TABLET | Freq: Two times a day (BID) | ORAL | 0 refills | Status: AC | PRN

## 2021-11-04 NOTE — Assessment & Plan Note (Signed)
Chronic, waxing and waning. Joint injection administered 2021 by previous ortho with [facility]. Some relief No improvement with spint Denies any injury  needs referral for ortho in her insurance network

## 2021-11-04 NOTE — Assessment & Plan Note (Signed)
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 No paresthesia or weakness. No OTC med use  Advised about need for shoulder and neck exercise, alternate between warm and cold compress as needed, use voltaren gel and muscle relaxant as needed.

## 2021-11-04 NOTE — Progress Notes (Signed)
Established Patient Visit  Patient: Shelley Escobar   DOB: March 12, 1971   51 y.o. Female  MRN: [id number] Visit Date: 11/04/2021  Subjective:    Chief Complaint  Patient presents with   Follow-up    6 month follow up. Son is interpreting today for patient. Patient would like referral to hand ortho for pain in fingers on right hand. She used to see a Tax adviser through [facility], but would like a Tax adviser through [facility]. Blood sugar levels range from 90-105.   HPI   Wt Readings from Last 3 Encounters:  11/04/21 157 lb 12.8 oz (71.6 kg)  05/05/21 162 lb 6.4 oz (73.7 kg)  12/08/20 162 lb 12.8 oz (73.8 kg)    DM (diabetes mellitus) ([place]) Controlled with fasting glucose 90-105 Current use of ozempic 0.'5mg'$ . no adverse side effects. She will like to switch to trulicity due to intermittent drug shortage with ozempic. Advised to schedule appt for annual DM eye exam. She agreed to ophthalmology referral. BP at goal LDL at goal  Repeat hgbA1c, CMP, lipid panel and urine microalbumin switched from ozempic 0.'5mg'$  to trulicity 0.'75mg'$  weekly F/up in 54month  Essential hypertension BP at goal with lisinopril BP Readings from Last 3 Encounters:  11/04/21 104/68  05/05/21 100/60  12/08/20 130/68   Maintain med dose Repeat CMP  Trigger finger of thumb Chronic, waxing and waning. 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 No paresthesia or weakness. No OTC med use  Advised about need for shoulder and neck exercise, alternate between warm and cold compress as needed, use voltaren gel and muscle relaxant as needed.  Reviewed medical, surgical, and  social history today  Medications: Outpatient Medications Prior to Visit  Medication Sig   clotrimazole-betamethasone (LOTRISONE) cream Apply 1 application topically 2 (two) times daily.   estradiol (ESTRACE) 0.1 MG/GM vaginal cream Place 1 Applicatorful vaginally at bedtime.   lisinopril (ZESTRIL) 2.5 MG tablet Take 1 tablet (2.5 mg total) by mouth daily.   meloxicam (MOBIC) 7.5 MG tablet Take 1 tablet (7.5 mg total) by mouth daily. With food   metFORMIN (GLUCOPHAGE) 850 MG tablet Take 1 tablet (850 mg total) by mouth daily with breakfast.   valACYclovir (VALTREX) 500 MG tablet Take 500 mg by mouth 2 (two) times daily.   [DISCONTINUED] Dulaglutide (TRULICITY) 04.17MEY/8.1KGSOPN Inject 0.75 mg into the skin once a week. (Patient not taking: Reported on 11/04/2021)   [DISCONTINUED] lidocaine (LIDODERM) 5 % Place 1 patch onto the skin daily. Remove & Discard patch within 12 hours or as directed by MD (Patient not taking: Reported on 11/04/2021)   No facility-administered medications prior to visit.   Reviewed past medical and social history.   ROS per HPI above      Objective:  BP 104/68 (BP Location: Right Arm, Patient Position: Sitting, Cuff Size: Small)   Pulse 70   Temp (!) 97.5 F (36.4 C) (Temporal)   Wt 157 lb 12.8 oz (71.6 kg)   LMP 10/15/2018 (LMP Unknown)   SpO2 98%   BMI 28.86 kg/m      Physical Exam Vitals reviewed.  Cardiovascular:  Rate and Rhythm: Normal rate and regular rhythm.     Pulses: Normal pulses.     Heart sounds: Normal heart sounds.  Pulmonary:     Effort: Pulmonary effort is normal.     Breath sounds: Normal breath sounds.  Musculoskeletal:     Left shoulder: Tenderness present. No swelling, effusion, laceration, bony tenderness or crepitus. Normal range of motion. Normal strength. Normal pulse.     Left upper arm: Normal.     Cervical back: Normal range of motion and neck supple. Spasms present. No erythema, rigidity, torticollis, tenderness  or bony tenderness. Pain with movement present. Normal range of motion.     Right lower leg: No edema.     Left lower leg: No edema.  Skin:    General: Skin is warm and dry.  Neurological:     Mental Status: [name], place, and time.    No results found for any visits on 11/04/21.    Assessment & Plan:    Problem List Items Addressed This Visit       Cardiovascular and Mediastinum   Essential hypertension    BP at goal with lisinopril BP Readings from Last 3 Encounters:  11/04/21 104/68  05/05/21 100/60  12/08/20 130/68   Maintain med dose Repeat CMP      Relevant Orders   Comprehensive metabolic panel     Endocrine   DM (diabetes mellitus) ([place])    Controlled with fasting glucose 90-105 Current use of ozempic 0.'5mg'$ . no adverse side effects. She will like to switch to trulicity due to intermittent drug shortage with ozempic. Advised to schedule appt for annual DM eye exam. She agreed to ophthalmology referral. BP at goal LDL at goal  Repeat hgbA1c, CMP, lipid panel and urine microalbumin switched from ozempic 0.'5mg'$  to trulicity 0.'75mg'$  weekly F/up in 10month       Relevant Medications   Dulaglutide (TRULICITY) 04.16MSA/6.3KZSOPN   Other Relevant Orders   Microalbumin / creatinine urine ratio   Hemoglobin A1c   Ambulatory referral to Ophthalmology   Comprehensive metabolic panel   Hyperlipidemia associated with type 2 diabetes mellitus (HCC)    Repeat lipid panel       Relevant Medications   Dulaglutide (TRULICITY) 06.01MUX/3.2TFSOPN   Other Relevant Orders   Comprehensive metabolic panel   Lipid panel     Musculoskeletal and Integument   Trigger finger of thumb - Primary    Chronic, waxing and waning. 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 No paresthesia or weakness. No OTC med use  Advised about need for shoulder and neck exercise, alternate between warm and cold compress as needed, use voltaren gel and muscle relaxant as needed.       Relevant Medications   diclofenac Sodium (VOLTAREN) 1 % GEL   methocarbamol (ROBAXIN) 500 MG tablet   Return in about 3 months (around 02/04/2022) for DM and HTN, hyperlipidemia.     CWilfred Lacy NP

## 2021-11-04 NOTE — Assessment & Plan Note (Signed)
Repeat lipid panel ?

## 2021-11-04 NOTE — Assessment & Plan Note (Signed)
Controlled with fasting glucose 90-105 Current use of ozempic 0.'5mg'$ . no adverse side effects. She will like to switch to trulicity due to intermittent drug shortage with ozempic. Advised to schedule appt for annual DM eye exam. She agreed to ophthalmology referral. BP at goal LDL at goal  Repeat hgbA1c, CMP, lipid panel and urine microalbumin switched from ozempic 0.'5mg'$  to trulicity 0.'75mg'$  weekly F/up in 64month

## 2021-11-04 NOTE — Assessment & Plan Note (Signed)
BP at goal with lisinopril BP Readings from Last 3 Encounters:  11/04/21 104/68  05/05/21 100/60  12/08/20 130/68   Maintain med dose Repeat CMP

## 2021-11-04 NOTE — Patient Instructions (Addendum)
Go to lab

## 2021-11-05 LAB — HEMOGLOBIN A1C
Hgb A1c MFr Bld: 11.8 % of total Hgb — ABNORMAL HIGH (ref <5.7)
Mean Plasma Glucose: 292 mg/dL
eAG (mmol/L): 16.2 mmol/L

## 2021-11-05 MED ORDER — METFORMIN HCL 850 MG PO TABS: 850.0000 mg | ORAL_TABLET | Freq: Two times a day (BID) | ORAL | 3 refills | Status: DC

## 2021-11-05 NOTE — Assessment & Plan Note (Addendum)
Increase hgbA1c: 6.8 to 11.8% need to maintain trulicity dose. Increase metformin to '850mg'$  BID. F/up in 77month

## 2021-11-09 ENCOUNTER — Other Ambulatory Visit: Payer: Self-pay | Admitting: Nurse Practitioner

## 2021-11-09 DIAGNOSIS — E1165 Type 2 diabetes mellitus with hyperglycemia: Secondary | ICD-10-CM

## 2021-11-09 MED ORDER — TRULICITY 0.75 MG/0.5ML ~~LOC~~ SOAJ: 0.7500 mg | SUBCUTANEOUS | 3 refills | Status: AC

## 2021-11-11 ENCOUNTER — Encounter: Payer: Self-pay | Admitting: Orthopedic Surgery

## 2021-11-11 ENCOUNTER — Ambulatory Visit (INDEPENDENT_AMBULATORY_CARE_PROVIDER_SITE_OTHER): Payer: 59 | Admitting: Orthopedic Surgery

## 2021-11-11 VITALS — Ht 66.0 in | Wt 157.8 lb

## 2021-11-11 DIAGNOSIS — M65321 Trigger finger, right index finger: Secondary | ICD-10-CM | POA: Diagnosis not present

## 2021-11-11 DIAGNOSIS — M65311 Trigger thumb, right thumb: Secondary | ICD-10-CM

## 2021-11-11 DIAGNOSIS — M65331 Trigger finger, right middle finger: Secondary | ICD-10-CM | POA: Insufficient documentation

## 2021-11-11 MED ORDER — LIDOCAINE HCL 1 % IJ SOLN
1.0000 mL | INTRAMUSCULAR | Status: AC | PRN
  Administered 2021-11-11: 1 mL

## 2021-11-11 MED ORDER — BETAMETHASONE SOD PHOS & ACET 6 (3-3) MG/ML IJ SUSP
6.0000 mg | INTRAMUSCULAR | Status: AC | PRN
  Administered 2021-11-11: 6 mg via INTRA_ARTICULAR

## 2021-11-11 NOTE — Progress Notes (Signed)
Office Visit Note   Patient: Shelley Escobar           Date of Birth: 11/19/1970           MRN: [id number] Visit Date: 11/11/2021              Requested by: Flossie Buffy, NP [place],  [place] [address] PCP: Flossie Buffy, NP   Assessment & Plan: Visit Diagnoses:  1. Trigger finger of right thumb   2. Trigger index finger of right hand   3. Trigger finger, right middle finger     Plan: Patient has obvious triggering of the right thumb with what seems like early triggering of the index and middle fingers.  She previously underwent corticosteroid injection into the right thumb with significant symptom relief for a year to year and a half.  We again reviewed the nature of trigger finger as well as its diagnosis and both conservative and surgical treatment options.  She would like to proceed with repeat corticosteroid injection of the right thumb as well as the index and middle fingers as these are quite bothersome for her.  I can see her back in 8 weeks if she still symptomatic.  Follow-Up Instructions: No follow-ups on file.   Orders:  No orders of the defined types were placed in this encounter.  No orders of the defined types were placed in this encounter.     Procedures: Hand/UE Inj: R thumb A1 for trigger finger on 11/11/2021 9:06 AM Indications: tendon swelling and therapeutic Details: 25 G needle, volar approach Medications: 1 mL lidocaine 1 %; 6 mg betamethasone acetate-betamethasone sodium phosphate 6 (3-3) MG/ML Procedure, treatment alternatives, risks and benefits explained, specific risks discussed. Consent was given by the patient. Immediately prior to procedure a time out was called to verify the correct patient, procedure, equipment, support staff and site/side marked as required. Patient was prepped and draped in the usual sterile fashion.    Hand/UE Inj: R index A1 for trigger finger on 11/11/2021 9:07 AM Indications: tendon swelling  and therapeutic Details: 25 G needle, volar approach Medications: 1 mL lidocaine 1 %; 6 mg betamethasone acetate-betamethasone sodium phosphate 6 (3-3) MG/ML Procedure, treatment alternatives, risks and benefits explained, specific risks discussed. Consent was given by the patient. Immediately prior to procedure a time out was called to verify the correct patient, procedure, equipment, support staff and site/side marked as required. Patient was prepped and draped in the usual sterile fashion.    Hand/UE Inj: L long A1 for trigger finger on 11/11/2021 9:07 AM Indications: tendon swelling and therapeutic Details: 25 G needle, volar approach Medications: 1 mL lidocaine 1 %; 6 mg betamethasone acetate-betamethasone sodium phosphate 6 (3-3) MG/ML Procedure, treatment alternatives, risks and benefits explained, specific risks discussed. Consent was given by the patient. Immediately prior to procedure a time out was called to verify the correct patient, procedure, equipment, support staff and site/side marked as required. Patient was prepped and draped in the usual sterile fashion.      Clinical Data: No additional findings.   Subjective: Chief Complaint  Patient presents with   Right Thumb - New Patient (Initial Visit)    This is a Shelley year old left-hand-dominant Escobar presents with painful triggering of the right thumb and increased pain involving the index and middle fingers.  She was last seen at an outside hospital in 2021 and underwent corticosteroid injection into the thumb and maybe the index finger.  She had significant  relief of the thumb triggering for a year to a year and a half.  Her symptoms have since returned and are quite bothersome for her.  She would like a repeat corticosteroid injection today.   Review of Systems   Objective: Vital Signs: Ht '5\' 6"'$  (1.676 m)   Wt 157 lb 12.8 oz (71.6 kg)   LMP 10/15/2018 (LMP Unknown)   BMI 25.47 kg/m   Physical Exam Constitutional:       Appearance: Normal appearance.  Cardiovascular:     Rate and Rhythm: Normal rate.     Pulses: Normal pulses.  Pulmonary:     Effort: Pulmonary effort is normal.  Skin:    General: Skin is warm and dry.     Capillary Refill: Capillary refill takes less than 2 seconds.  Neurological:     Mental Status: She is alert.    Right Hand Exam   Tenderness  Right hand tenderness location: TTP at thumb, index, and middle fingers at the level of the A1 pulley.  Other  Erythema: absent Sensation: normal Pulse: present  Comments:  Palpable and visible triggering of the thumb.  Palpable nodules at the index and middle finger A1 pulleys with limited flexion secondary to pain.      Specialty Comments:  No specialty comments available.  Imaging: No results found.   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 02/08/2019   DM (diabetes mellitus) ([place]) 11/23/2018   Status post laparoscopic hysterectomy 11/13/2018   Iron deficiency anemia secondary to inadequate dietary iron intake 05/23/2018   IDA (iron deficiency anemia) 05/23/2018   Fatty infiltration of liver 03/10/2016   Obesity due to excess calories with serious comorbidity 01/27/2012   Hyperlipidemia associated with type 2 diabetes mellitus ([place]) 07/02/2011   Past Medical History:  Diagnosis Date   Acute appendicitis 01/13/2016   Anemia    hx   Chest pain    reports pain orginates in right shoulder and radiates to central chest; reports  as intermittient, lasting 2 hours when it occurs, nothing makes it better or worse, doesnt radiate anywhere else, not accompanied by sob , Aro, vision change, nor diaphoresis, doesn't happen after large meals    DM (diabetes mellitus) ([place])    2   Endometrial polyp    Not removed as of 09/07/18   Hyperlipidemia    Menorrhagia with regular cycle    SVD (spontaneous vaginal delivery)    x 3   Vitamin D deficiency     Family History  Adopted: Yes  Problem Relation Age of Onset   Breast cancer Neg Hx     Past Surgical History:  Procedure Laterality Date   CYSTOSCOPY  11/13/2018   Procedure: CYSTOSCOPY;  Surgeon: Nunzio Cobbs, MD;  Location: [facility];  Service: Gynecology;;   HYSTEROSCOPY WITH D & C N/A 08/07/2013   Procedure: DILATATION AND CURETTAGE /HYSTEROSCOPY;  Surgeon: Azalia Bilis, MD;  Location: [facility];  Service: Gynecology;  Laterality: N/A;   INTRAUTERINE DEVICE (  IUD) INSERTION  09/13/2013   no longer in place    LAPAROSCOPIC APPENDECTOMY N/A 01/13/2016   Procedure: APPENDECTOMY LAPAROSCOPIC;  Surgeon: Armandina Gemma, MD;  Location: [facility];  Service: General;  Laterality: N/A;   TOTAL LAPAROSCOPIC HYSTERECTOMY WITH SALPINGECTOMY Bilateral 11/13/2018   Procedure: TOTAL LAPAROSCOPIC HYSTERECTOMY WITH SALPINGECTOMY  REPAIR VAGIAL WALL LACERATION;  Surgeon: Nunzio Cobbs, MD;  Location: [facility];  Service: Gynecology;  Laterality: Bilateral;  2.5 hours needed. Extended recovery bed needed. Patient will need interpreter.   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

## 2021-11-15 ENCOUNTER — Encounter (INDEPENDENT_AMBULATORY_CARE_PROVIDER_SITE_OTHER): Payer: 59 | Admitting: Nurse Practitioner

## 2021-11-15 DIAGNOSIS — Z794 Long term (current) use of insulin: Secondary | ICD-10-CM

## 2021-11-15 DIAGNOSIS — E1165 Type 2 diabetes mellitus with hyperglycemia: Secondary | ICD-10-CM

## 2021-11-17 MED ORDER — INSULIN GLARGINE 100 UNITS/ML SOLOSTAR PEN: 10.0000 [IU] | PEN_INJECTOR | Freq: Every day | SUBCUTANEOUS | 2 refills | Status: DC

## 2021-11-17 MED ORDER — PEN NEEDLES 31G X 6 MM MISC: 1.0000 "application " | Freq: Every day | 2 refills | Status: AC

## 2021-11-17 NOTE — Assessment & Plan Note (Addendum)
Reports fasting glucose 200-245. Current use of metformin and trulicity.  Add lantus, titrate dose to fasting glucose <150 Consider nutritionist referral F/up in 25month

## 2021-11-17 NOTE — Telephone Encounter (Signed)
Please see the MyChart message reply(ies) for my assessment and plan.  The patient gave consent for this Medical Advice Message and is aware that it may result in a bill to their insurance company as well as the possibility that this may result in a co-payment or deductible. They are an established patient, but are not seeking medical advice exclusively about a problem treated during an in person or video visit in the last 7 days. I did not recommend an in person or video visit within 7 days of my reply.  I spent a total of 20 minutes cumulative time within 7 days through [employer].  DM (diabetes mellitus) ([place]) Reports fasting glucose 200-245. Current use of metformin and trulicity.  Add lantus, titrate dose to fasting glucose <150 Consider nutritionist referral F/up in 29month  Nature Kueker, NP

## 2021-11-19 NOTE — Telephone Encounter (Signed)
Faxed new Rx. Spoke w/ pharmacy, confirmed they had Fax. Notified pt's son.

## 2021-11-23 ENCOUNTER — Telehealth: Payer: Self-pay

## 2021-11-23 ENCOUNTER — Telehealth: Payer: Self-pay | Admitting: Nurse Practitioner

## 2021-11-23 DIAGNOSIS — E1165 Type 2 diabetes mellitus with hyperglycemia: Secondary | ICD-10-CM

## 2021-11-23 MED ORDER — INSULIN GLARGINE-YFGN 100 UNIT/ML ~~LOC~~ SOPN: PEN_INJECTOR | SUBCUTANEOUS | 5 refills | Status: AC

## 2021-11-23 NOTE — Telephone Encounter (Signed)
P's son advised.

## 2021-11-23 NOTE — Telephone Encounter (Signed)
Called & spoke w/ [facility] Rep Threasa Alpha) Semglee 100/1ML will be the alternative to her lantus that her insurance will cover.

## 2021-11-23 NOTE — Telephone Encounter (Signed)
Switched lantus to [employer] per insurance coverage.

## 2021-12-07 MED ORDER — FREESTYLE LIBRE 14 DAY SENSOR MISC: 1.0000 [IU] | 11 refills | Status: AC

## 2021-12-07 MED ORDER — FREESTYLE LIBRE READER DEVI: 1.0000 [IU] | Freq: Once | 0 refills | Status: AC

## 2021-12-07 NOTE — Addendum Note (Signed)
Addended by: Leana Gamer on: 12/07/2021 04:32 PM   Modules accepted: Orders

## 2021-12-28 ENCOUNTER — Ambulatory Visit: Payer: 59 | Admitting: Nurse Practitioner

## 2021-12-29 ENCOUNTER — Ambulatory Visit: Payer: 59 | Admitting: Nurse Practitioner

## 2022-01-04 ENCOUNTER — Encounter (INDEPENDENT_AMBULATORY_CARE_PROVIDER_SITE_OTHER): Payer: 59 | Admitting: Ophthalmology

## 2022-01-11 ENCOUNTER — Encounter (INDEPENDENT_AMBULATORY_CARE_PROVIDER_SITE_OTHER): Payer: 59 | Admitting: Ophthalmology

## 2022-02-08 ENCOUNTER — Ambulatory Visit: Payer: 59 | Admitting: Nurse Practitioner

## 2022-02-08 ENCOUNTER — Telehealth: Payer: Self-pay | Admitting: Nurse Practitioner

## 2022-02-08 NOTE — Telephone Encounter (Signed)
8.21.23 no show letter sent 

## 2022-02-09 NOTE — Telephone Encounter (Signed)
1st no show, fee waived, letter

## 2022-02-17 ENCOUNTER — Encounter: Payer: Self-pay | Admitting: Family

## 2022-04-18 ENCOUNTER — Encounter: Payer: Self-pay | Admitting: Family

## 2022-04-19 ENCOUNTER — Encounter: Payer: Self-pay | Admitting: Family

## 2022-12-23 ENCOUNTER — Other Ambulatory Visit: Payer: Self-pay | Admitting: Nurse Practitioner

## 2022-12-23 DIAGNOSIS — E1165 Type 2 diabetes mellitus with hyperglycemia: Secondary | ICD-10-CM
